# Patient Record
Sex: Male | Born: 1940 | Race: White | Hispanic: No | Marital: Married | Smoking: Former smoker
Health system: Southern US, Community
[De-identification: ages and names within clinical notes are randomized; demographics above are authoritative.]

## PROBLEM LIST (undated history)

## (undated) DIAGNOSIS — G7000 Myasthenia gravis without (acute) exacerbation: Secondary | ICD-10-CM

## (undated) DIAGNOSIS — M1990 Unspecified osteoarthritis, unspecified site: Secondary | ICD-10-CM

## (undated) DIAGNOSIS — I10 Essential (primary) hypertension: Secondary | ICD-10-CM

## (undated) DIAGNOSIS — K5090 Crohn's disease, unspecified, without complications: Secondary | ICD-10-CM

## (undated) DIAGNOSIS — J439 Emphysema, unspecified: Secondary | ICD-10-CM

## (undated) DIAGNOSIS — J449 Chronic obstructive pulmonary disease, unspecified: Secondary | ICD-10-CM

## (undated) HISTORY — PX: JOINT REPLACEMENT: SHX530

## (undated) HISTORY — PX: WISDOM TOOTH EXTRACTION: SHX21

## (undated) HISTORY — PX: TONSILLECTOMY: SUR1361

## (undated) SURGERY — ULTRASOUND, UPPER GI TRACT, ENDOSCOPIC
Anesthesia: Monitor Anesthesia Care

---

## 2001-07-28 ENCOUNTER — Ambulatory Visit (HOSPITAL_COMMUNITY): Admission: RE | Admit: 2001-07-28 | Discharge: 2001-07-28 | Payer: Self-pay | Admitting: Internal Medicine

## 2004-09-15 ENCOUNTER — Ambulatory Visit: Payer: Self-pay | Admitting: Internal Medicine

## 2005-10-24 ENCOUNTER — Ambulatory Visit: Payer: Self-pay | Admitting: Internal Medicine

## 2007-03-25 ENCOUNTER — Ambulatory Visit: Payer: Self-pay | Admitting: Internal Medicine

## 2007-04-21 ENCOUNTER — Encounter: Payer: Self-pay | Admitting: Internal Medicine

## 2007-04-21 ENCOUNTER — Ambulatory Visit: Payer: Self-pay | Admitting: Internal Medicine

## 2007-04-21 ENCOUNTER — Ambulatory Visit (HOSPITAL_COMMUNITY): Admission: RE | Admit: 2007-04-21 | Discharge: 2007-04-21 | Payer: Self-pay | Admitting: Internal Medicine

## 2007-05-07 ENCOUNTER — Ambulatory Visit: Payer: Self-pay | Admitting: Internal Medicine

## 2007-06-11 ENCOUNTER — Other Ambulatory Visit: Payer: Self-pay

## 2007-06-11 ENCOUNTER — Ambulatory Visit: Payer: Self-pay | Admitting: Surgery

## 2007-06-16 ENCOUNTER — Ambulatory Visit: Payer: Self-pay | Admitting: Surgery

## 2007-10-20 DIAGNOSIS — K5730 Diverticulosis of large intestine without perforation or abscess without bleeding: Secondary | ICD-10-CM | POA: Insufficient documentation

## 2007-10-20 DIAGNOSIS — K222 Esophageal obstruction: Secondary | ICD-10-CM | POA: Insufficient documentation

## 2007-10-20 DIAGNOSIS — L219 Seborrheic dermatitis, unspecified: Secondary | ICD-10-CM | POA: Insufficient documentation

## 2007-10-20 DIAGNOSIS — K5090 Crohn's disease, unspecified, without complications: Secondary | ICD-10-CM | POA: Insufficient documentation

## 2007-10-20 DIAGNOSIS — K449 Diaphragmatic hernia without obstruction or gangrene: Secondary | ICD-10-CM | POA: Insufficient documentation

## 2007-10-20 DIAGNOSIS — K219 Gastro-esophageal reflux disease without esophagitis: Secondary | ICD-10-CM | POA: Insufficient documentation

## 2009-05-23 ENCOUNTER — Ambulatory Visit: Payer: Self-pay | Admitting: General Practice

## 2009-06-08 ENCOUNTER — Inpatient Hospital Stay: Payer: Self-pay | Admitting: General Practice

## 2010-01-12 ENCOUNTER — Other Ambulatory Visit: Payer: Self-pay | Admitting: Ophthalmology

## 2010-02-03 ENCOUNTER — Ambulatory Visit: Payer: Self-pay | Admitting: Neurology

## 2010-06-20 NOTE — Assessment & Plan Note (Signed)
NAMEMarland Kitchen  CASIN, FEDERICI               CHART#:  [id number]   DATE:  05/07/2007                       DOB:  May 18, 1940   CHIEF COMPLAINT:  Bulge in the left lower abdomen.   SUBJECTIVE:  Mr. Sperl is here for a followup visit.  He recently  underwent a colonoscopy with Dr. Gala Romney on 04/21/2007.  This was done  primarily given the fact that it had been a while since his last  colonoscopy.  He does have a history of Crohn's colitis.  He had patchy  inflammatory changes of the rectum and entire colon.  He underwent  segmental biopsy.  This all revealed active colitis consistent with IBD.  His Asacol was increased to 400 mg t.i.d.  He presents today primarily  because he has noticed, since his colonoscopy, a swelling in his left  lower abdomen.  It is somewhat tender at times but really is not  painful.  At times, it seems to bulge out more than others.  He notes it  especially if he coughs or strains.  It seems to be worse in the  evenings.  Denies any blood in the stool or melena.  He generally has 1-  2 formed bowel movements a day.  No abdominal pain, nausea or vomiting.   CURRENT MEDICATIONS:  See updated list.   ALLERGIES:  No known drug allergies.   PHYSICAL EXAMINATION:  VITAL SIGNS:  Weight 222.  Temperature 97.7.  Blood pressure 120/80.  Pulse 72.  GENERAL:  Pleasant, well-nourished, well-developed Caucasian male in no  acute distress.  SKIN:  Warm and dry.  No jaundice.  HEENT:  Sclerae nonicteric.  ABDOMEN:  Positive bowel sounds.  Abdomen soft, nondistended.  Down in  the left suprapubic region, there is a discrete bulging consistent with  a herniation, which is easily reducible and nontender.  LOWER EXTREMITIES:  No edema.   IMPRESSION:  Mr. Bolotin is a 70 year old gentleman with Crohn's colitis  with active mucosal disease on recent colonoscopy.  Clinically, he is  doing well.  He is interested in switching from Asacol to a once daily  dosing Apriso.  He presents today  with complaints of lower abdominal  bulging, which is consistent with a hernia.  I have offered him a  referral to a surgeon but he plans to discuss this with his sister who  is a Marine scientist and may have a Nurse, children's that he is interested in.  If he needs a referral, he will let me know.  Otherwise, he will make  the appointment and be seen for possible elective hernia repair.   PLAN:  Apriso 0.375 grams 4 tablets daily in the morning #120 with 3  refills given.  Savings card provided to as well as #12 samples.  If  cost of medication is higher than Asacol, then he will simply have his  Asacol filled.  I gave him a prescription for both.  Asacol is for 400  mg t.i.d. #30 day's supply with refills.  He is due to come back in 1  year for a followup.  If he needs a referral to a surgeon, he will let  me know.  Otherwise, he will see one electively on his own per his  request.       Neil Crouch, P.A.  Electronically Signed  Bridgette Habermann, M.D.  Electronically Signed    LL/MEDQ  D:  05/07/2007  T:  05/07/2007  Job:  [id number]

## 2010-06-20 NOTE — Op Note (Signed)
Kyle Holloway, Kyle Holloway              ACCOUNT NO.:  [id number]   MEDICAL RECORD NO.:  [id number]          PATIENT TYPE:  AMB   LOCATION:  DAY                           FACILITY:  [facility]   PHYSICIAN:  R. Garfield Cornea, M.D. DATE OF BIRTH:  [birth year]   DATE OF PROCEDURE:  04/21/2007  DATE OF DISCHARGE:                               OPERATIVE REPORT   PROCEDURE:  Colonoscopy with segmental biopsy.   INDICATIONS FOR PROCEDURE:  A 70 year old gentleman with at least a 75-  year history of Crohn's colitis diagnosed elsewhere.  He had been on  Colazal and Asacol since diagnosed in 1996.  A small-bowel follow-  through was normal previously in [place], [place].  He has one  to two formed bowel movements daily, has not had any tenesmus, rectal  bleeding, abdominal pain, nausea or vomiting.  His sense of well-being  is good.  Since it has been some time since his last colonoscopy, a  colonoscopy is now being done.  This approach has been discussed with  the patient at length.  Potential risks, benefits and alternatives have  been reviewed, questions answered.  Please see documentation on the  medical record.   PROCEDURE NOTE:  O2 saturation, blood pressure, pulse and respirations  were monitored throughout the entire procedure.   CONSCIOUS SEDATION:  Versed 5 mg IV, Demerol 100 mg IV in divided doses.   INSTRUMENT:  Pentax video chip system.   FINDINGS:  Digital rectal exam revealed no abnormalities.   ENDOSCOPIC FINDINGS:  Prep was adequate.   Colon:  Colonic mucosa was surveyed from the rectosigmoid junction to  the left, transverse, right colon to the area of the appendiceal  orifice, ileocecal valve and cecum.  These structures were well seen and  photographed for the record.  From this level the scope was slowly  withdrawn and all previously mentioned mucosal surfaces were again seen.  The patient had extensive left-sided diverticula.  The patient had  patchy inflammatory  changes from the rectosigmoid junction all the way  to the cecum.  He had erosions, ulcerations, friability and granularity  in a patchy distribution from the rectosigmoid junction all the way to  the cecum.  Please see multiple photographs taken.  I attempted to  intubate the terminal ileum but could not get a good intubation.  The  opening to the ileocecal valve, however, appeared normal.  Segmental  biopsies of the ascending, transverse, descending segments were taken  for histologic study.  There was no evidence of neoplasia or polyp.  The  scope was pulled down in the rectum.  The rectal vault was quite small.  I attempted to retroflex, but was unable to do so, but for the same  reason, I was able see the rectal mucosa en face very well and the  patient's similar inflammatory changes seen in the colon extending all  the way down to the anal verge in a patchy distribution.  Biopsies of  the abnormal-appearing rectal mucosa were taken as well.  The patient  tolerated the procedure well as reactive to endoscopy.   IMPRESSION:  Patchy inflammatory changes of the rectum and entire colon,  status post segmental biopsy.   This gentleman has active mucosal disease.  His rectum is involved as  well.  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  He really has no  gastrointestinal symptoms.   RECOMMENDATIONS:  We will suggested he bump his Asacol up to two tablets  three times a day and take that as full-dose therapy.  Would also give  him latitude to consider switching to Lialda 4.8 grams once daily to  enhance compliance.  Further recommendations once the path comes back.      Kyle Holloway, M.D.  Electronically Signed     RMR/MEDQ  D:  04/21/2007  T:  04/21/2007  Job:  [id number]

## 2010-06-20 NOTE — H&P (Signed)
NAMECOLON, RUETH              ACCOUNT NO.:  [id number]   MEDICAL RECORD NO.:  [id number]         PATIENT TYPE:  AMB   LOCATION:  DAY                           FACILITY:  [facility]   PHYSICIAN:  R. Garfield Cornea, M.D. DATE OF BIRTH:  12-31-1940   DATE OF ADMISSION:  DATE OF DISCHARGE:  LH                              HISTORY & PHYSICAL   CHIEF COMPLAINT:  History of Crohn's colitis.   HISTORY OF PRESENT ILLNESS:  Kyle Holloway is a pleasant 70-year-  old gentleman who now lives in [place] who came to see me in follow-  up regarding Crohn's colitis.  He has previously seen Dr. Laural Golden and in  the distant past Dr. Rhona Leavens down at [facility] in [place]  back in 1996.  He was having abdominal cramps, diarrhea, underwent a  colonoscopy, and ultimately was diagnosed with Crohn's colitis.  At  colonoscopy in 1996 small-bowel follow-through at that time was  negative.  He was on Colazal and subsequently Asacol ever since that  time.  His symptoms rapidly resolved with mesalamine therapy.  He has 1-  2 formed bowel movements daily.  He has not had any tenesmus.  No  abdominal pain, nausea, vomiting.  He has not had any eye, skin or joint  problems.   He has history of gastroesophageal reflux disease as well as Schatzki's  ring which Dr. Sonny Masters dilated back in the 90's.  He has not had any  recurrent symptoms.   We do not have any path reports regarding diagnosis of Crohn's colitis  made previously, but Kyle Holloway has done well over the years.  He takes  2-4 Asacol tablets daily.  For a good year he did not take any, but Dr.  Laural Golden asked him to resume previously.  He could not tell any difference  not taking any Asacol.  There is no family history of inflammatory bowel  disease or colorectal neoplasia.   Dr. Laural Golden last saw him in September 2007 and the plan was to offer him  a surveillance colonoscopy in 2008, but he now returns in 2009.   PAST MEDICAL HISTORY:   Noted for gastroesophageal reflux disease,  Crohn's colitis. A 2003 colonoscopy by Dr. Laural Golden demonstrated a  pancolonic diverticula but no evidence of any other mucosal  abnormalities that go with inflammatory bowel disease.   PAST SURGERIES:  None.   PRIMARY CARE PHYSICIAN:  Dr. Jerene Bears in [place], [place].   He is retired.  He recently relocated to [place] where he lived  previously.  He just moved from [place], [place].   CURRENT MEDICATIONS:  1. Asacol two tablets twice daily.  2. Omeprazole 20 mg daily.  3. ASA 325 mg daily.   ALLERGIES:  No known drug allergies.   FAMILY HISTORY:  Mother died at age 14 of old age.  Father died at age  31 with a malignant brain tumor.  He has two sisters with some joint  problems.  One brother in good health.   SOCIAL HISTORY:  The patient is retired from [employer] of  city of  [place].  He is married.  He just sold his house in [place],  [place], and relocated to [place] where they bought a town home so  they could be closer to their children.  No tobacco, no alcohol.   REVIEW OF SYSTEMS:  No odynophagia, dysphagia, early satiety or reflux  symptoms.  No abdominal pain, no change in weight (although we do have  him up 8 pounds since his October 24, 2005 office visit).   PHYSICAL EXAMINATION:  GENERAL:  A pleasant 70 year old gentleman  resting comfortably.  VITAL SIGNS:  Weight 222, height 5 feet 10 inches, temp 97.8, BP 138/80,  pulse 80.  SKIN:  Warm and dry.  There is no jaundice or any stigmata of chronic  liver disease age.  HEENT:  No scleral icterus.  Conjunctivae pink.  CHEST:  Lungs clear to auscultation.  HEART:  Regular rate and rhythm without murmur, gallop or rub.  ABDOMEN:  Nondistended.  Positive bowel sounds.  Soft, entirely  nontender without appreciable masses or organomegaly.  EXTREMITIES:  No edema.   IMPRESSION:  Kyle Holloway is a pleasant 70 year old gentleman  who has  been given a diagnosis of Crohn's colitis, and he had pancolonic  diverticula in 2003.  Colonoscopy by Dr. Laural Golden.  The mucosa, otherwise,  appeared normal.  Dr. Laural Golden has previously raised the questions of whether or not he  actually has Crohn's colitis.  I too would also wonder about that  diagnosis on face value of how he has done and find [place] a prior  colonoscopy.   At this point we will continue on a working diagnosis of Crohn's colitis  even if that diagnosis was given to him some 13 years ago.  At this  point I would offer Kyle Holloway a surveillance colonoscopy at this time.  I have talked about this approach with Kyle Holloway.  Risks, benefits, and  alternatives have been reviewed.  He is willing to have one, but he has  questions about insurance coverage and will try get those questions  offered, but again this approach has been recommended regardless of  insurance benefits.  His gastroesophageal reflux disease symptoms have  been well-controlled on omeprazole, and I would recommend he continue on  that regimen.  Hopefully Kyle Holloway will schedule a colonoscopy in the  very near future, and will make further recommendations at that time.      Bridgette Habermann, M.D.  Electronically Signed     RMR/MEDQ  D:  03/25/2007  T:  03/26/2007  Job:  [id number]

## 2010-06-23 NOTE — Op Note (Signed)
[facility]  Patient:    Kyle Holloway, Kyle Holloway Visit Number: [id number] MRN: [id number]          Service Type: END Location: DAY Attending Physician:  Rogene Houston Dictated by:   Hildred Laser, M.D. Proc. Date: 07/28/01 Admit Date:  07/28/2001   CC:         Dr. Woody Seller   Operative Report  PROCEDURE:  Esophagogastroduodenoscopy with esophageal dilatation followed by total colonoscopy.  GASTROENTEROLOGIST:  Hildred Laser, M.D.  INDICATION:  Kyle Holloway is a 70 year old Caucasian male with chronic GERD who is maintained on antireflux measures and PPI.  He was having recurrent solid food dysphagia and is, therefore, undergoing therapeutic EGD.  He also has Crohns colitis.  His last colonoscopy was more than five years ago.  He is undergoing colonoscopy primarily for screening purposes.  He is on Asacol, and his symptoms are well controlled.  The procedure was reviewed with the patient, and informed consent was obtained.  PREOPERATIVE MEDICATIONS:  Cetacaine spray for pharyngeal topical anesthesia, Demerol 50 mg IV, Versed 5 mg IV.  INSTRUMENT:  Olympus video system.  FINDINGS:  Procedure performed in endoscopy suite.  The patients vital signs and O2 saturations were monitored during the procedure and remained stable.  PROCEDURE #1:  Esophagogastroduodenoscopy.  The patient was placed in the left lateral position and endoscope was passed into the oropharynx without difficulty into cervical esophagus where there was slight resistance.  I found that he had a web in this area.  The scope was advanced into the body of the esophagus.  Mucosa was normal.  He had Schatzkis ring at the GE junction and a moderate size sliding hiatal hernia.  Stomach:  It was empty and distended very well with insufflation.  Folds in the proximal stomach were normal.  Examination of mucosa at gastric body, antrum, and pyloric channel as well as angularis and fundus were  normal.  Duodenum:  Duodenum as well as bulb and second part of duodenum were also normal.  The endoscope was withdrawn.  Esophageal dilatation was performed by passing 56-French Maloney dilator through the esophagus completely which resulted in a small bore chipped here in the cervical esophagus, and ring was also effectively disrupted.  The endoscope was withdrawn, and the patient was prepared for procedure #2.  PROCEDURE #2 - Total Colonoscopy:  Rectal examination performed.  No abnormality noted on external or digital exam.  The scope was placed in the rectum and advanced under vision to sigmoid colon and beyond.   Preparation was satisfactory.  The scope was passed to the cecum which was identified by appendiceal orifice/stump and ileocecal valve. Pictures were taken for the record.  As the scope was withdrawn, the mucosa was carefully examined.  There were multiple diverticulum throughout the colon, but they were preponderant in the sigmoid colon.  Some of these were quite large.  The mucosa, however, was normal.  The rectal mucosa was also normal.  The scope was retroflexed in the rectum to examine the anorectal junction.  Small hemorrhoids were noted below the dentate line.  The endoscope was straightened and withdrawn.  The patient tolerated the procedure well.  FINAL DIAGNOSES: 1. Esophageal web and Schatzkis ring. 2. Moderate size sliding hiatal hernia. 3. Normal examination of stomach, first and second part of duodenum. 4. Esophagus dilated by passing 56-French Venia Minks dilatory disrupting both    the web and the ring. 5. Pancolonic diverticulosis.  No evidence of active Crohns colitis.    I  did not take biopsies this examination. 6. Small external hemorrhoids.  RECOMMENDATIONS:  Continue with antireflux measures, Prilosec and Asacol as before.  I would also like for him to stay on high-fiber diet and Citrucel 1 tablespoonful daily.  Will plan to see him in the  office in one year from now. Dictated by:   Hildred Laser, M.D. Attending Physician:  Rogene Houston DD:  07/28/01 TD:  07/29/01 Job: [id number]

## 2012-07-08 ENCOUNTER — Other Ambulatory Visit: Payer: Self-pay | Admitting: Family

## 2012-07-08 LAB — APTT: Activated PTT: 34.4 secs (ref 23.6–35.9)

## 2012-07-10 ENCOUNTER — Ambulatory Visit: Payer: Self-pay | Admitting: Gastroenterology

## 2013-05-01 ENCOUNTER — Ambulatory Visit: Payer: Self-pay | Admitting: Gastroenterology

## 2013-06-16 ENCOUNTER — Inpatient Hospital Stay: Payer: Self-pay | Admitting: Internal Medicine

## 2013-06-16 LAB — COMPREHENSIVE METABOLIC PANEL
ALT: 22 U/L (ref 12–78)
ANION GAP: 9 (ref 7–16)
AST: 14 U/L — AB (ref 15–37)
Albumin: 3.7 g/dL (ref 3.4–5.0)
Alkaline Phosphatase: 62 U/L
BUN: 14 mg/dL (ref 7–18)
Bilirubin,Total: 0.6 mg/dL (ref 0.2–1.0)
CALCIUM: 8.9 mg/dL (ref 8.5–10.1)
CHLORIDE: 106 mmol/L (ref 98–107)
CREATININE: 1.12 mg/dL (ref 0.60–1.30)
Co2: 25 mmol/L (ref 21–32)
Glucose: 119 mg/dL — ABNORMAL HIGH (ref 65–99)
OSMOLALITY: 281 (ref 275–301)
Potassium: 3.9 mmol/L (ref 3.5–5.1)
Sodium: 140 mmol/L (ref 136–145)
Total Protein: 8.1 g/dL (ref 6.4–8.2)

## 2013-06-16 LAB — CBC
HCT: 43 % (ref 40.0–52.0)
HGB: 13.9 g/dL (ref 13.0–18.0)
MCH: 32.2 pg (ref 26.0–34.0)
MCHC: 32.4 g/dL (ref 32.0–36.0)
MCV: 99 fL (ref 80–100)
Platelet: 309 10*3/uL (ref 150–440)
RBC: 4.32 10*6/uL — AB (ref 4.40–5.90)
RDW: 15.1 % — ABNORMAL HIGH (ref 11.5–14.5)
WBC: 11 10*3/uL — AB (ref 3.8–10.6)

## 2013-06-16 LAB — PROTIME-INR
INR: 1.1
PROTHROMBIN TIME: 13.8 s (ref 11.5–14.7)

## 2013-06-17 LAB — CBC WITH DIFFERENTIAL/PLATELET
BASOS ABS: 0 10*3/uL (ref 0.0–0.1)
BASOS PCT: 0.1 %
EOS ABS: 0 10*3/uL (ref 0.0–0.7)
Eosinophil %: 0 %
HCT: 37.5 % — AB (ref 40.0–52.0)
HGB: 12.7 g/dL — ABNORMAL LOW (ref 13.0–18.0)
Lymphocyte #: 0.7 10*3/uL — ABNORMAL LOW (ref 1.0–3.6)
Lymphocyte %: 6.6 %
MCH: 33.2 pg (ref 26.0–34.0)
MCHC: 33.9 g/dL (ref 32.0–36.0)
MCV: 98 fL (ref 80–100)
MONO ABS: 0.2 x10 3/mm (ref 0.2–1.0)
MONOS PCT: 1.5 %
NEUTROS ABS: 9.7 10*3/uL — AB (ref 1.4–6.5)
NEUTROS PCT: 91.8 %
PLATELETS: 272 10*3/uL (ref 150–440)
RBC: 3.83 10*6/uL — ABNORMAL LOW (ref 4.40–5.90)
RDW: 14.8 % — AB (ref 11.5–14.5)
WBC: 10.6 10*3/uL (ref 3.8–10.6)

## 2013-06-17 LAB — BASIC METABOLIC PANEL
ANION GAP: 6 — AB (ref 7–16)
BUN: 17 mg/dL (ref 7–18)
CALCIUM: 8.4 mg/dL — AB (ref 8.5–10.1)
CO2: 21 mmol/L (ref 21–32)
Chloride: 110 mmol/L — ABNORMAL HIGH (ref 98–107)
Creatinine: 0.94 mg/dL (ref 0.60–1.30)
EGFR (African American): >60
EGFR (Non-African Amer.): >60
GLUCOSE: 147 mg/dL — AB (ref 65–99)
OSMOLALITY: 278 (ref 275–301)
Potassium: 4.3 mmol/L (ref 3.5–5.1)
Sodium: 137 mmol/L (ref 136–145)

## 2013-06-17 LAB — HEMATOCRIT: HCT: 37.5 % — ABNORMAL LOW (ref 40.0–52.0)

## 2013-06-18 LAB — HEMOGLOBIN: HGB: 12.4 g/dL — ABNORMAL LOW (ref 13.0–18.0)

## 2013-12-28 DIAGNOSIS — N401 Enlarged prostate with lower urinary tract symptoms: Secondary | ICD-10-CM | POA: Insufficient documentation

## 2013-12-28 DIAGNOSIS — N400 Enlarged prostate without lower urinary tract symptoms: Secondary | ICD-10-CM | POA: Insufficient documentation

## 2014-04-08 ENCOUNTER — Ambulatory Visit: Payer: Self-pay | Admitting: Ophthalmology

## 2014-04-20 ENCOUNTER — Ambulatory Visit: Payer: Self-pay | Admitting: Ophthalmology

## 2014-05-11 ENCOUNTER — Ambulatory Visit
Admit: 2014-05-11 | Disposition: A | Discharge status: Home / Self Care | Payer: Self-pay | Attending: Ophthalmology | Admitting: Ophthalmology

## 2014-05-29 NOTE — H&P (Signed)
PATIENT NAME:  Kyle Holloway, Kyle Holloway MR#:  [id number] DATE OF BIRTH:  26-Mar-1940  DATE OF ADMISSION:  06/16/2013  PRIMARY PHYSICIAN: Dr. Baldemar Lenis.   EMERGENCY DEPARTMENT REFERRING PHYSICIAN: Dr. Jimmye Norman.   CHIEF COMPLAINT: Episode of bright red blood mixed in with maroon-colored stools x 2.   HISTORY OF PRESENT ILLNESS: The patient is a 74 year old white male who reports that he had a similar episode of bright red blood mixed with maroon-colored stools about a year ago at the same time. At that time, he was in the hospital in [place]. He had to be transfused, and the bleeding stopped spontaneously. Subsequently, he underwent a colonoscopy and EGD with Dr. Candace Cruise. He was noted to have a polyp but along with that he had multiple diverticula that were noted. The patient reports that he was doing fine up until 3:40 this afternoon when he started having bright red blood mixed with some maroon-colored blood. He has had 2 large episodes of this. Due to these, he came to the ED. His hemoglobin is currently stable at 13.9, but there is a concern that he continue to have recurrence of the bleeding. Therefore, he is being admitted. He otherwise denies any chest pain, shortness of breath. No palpitations. No nausea, vomiting, diarrhea. Denies any abdominal pain.   PAST MEDICAL HISTORY:  1. History of diverticulosis.  2. History of esophageal stricture.  3. History of Crohn's disease.  4. History of optical multiple sclerosis.   PAST SURGICAL HISTORY: Status post left knee surgery.   ALLERGIES: None.   MEDICATIONS: He is on methotrexate q. weekly. He is not sure of the dose. He is on Apriso 4 tabs daily, omeprazole daily, Flomax daily, folic acid daily.   SOCIAL HISTORY: Does not smoke. Does not drink. No drugs.   FAMILY HISTORY: Positive for hypertension.   REVIEW OF SYSTEMS:  CONSTITUTIONAL: Denies any fevers, fatigue, weakness. No pain. No weight loss. No weight gain.  EYES: No blurred or double  vision. No pain. No redness. No inflammation. No glaucoma. No cataracts.  ENT: No tinnitus. No ear pain. No hearing loss. No seasonal or year-round allergies. No difficulty swallowing.  RESPIRATORY: Denies any cough, wheezing, hemoptysis.  CARDIOVASCULAR: Denies any chest pain, orthopnea, edema or arrhythmia.  GASTROINTESTINAL: No nausea, vomiting, diarrhea. No abdominal pain. Complains of blood in the stool.  GENITOURINARY: Denies any dysuria, hematuria, renal calculus or frequency.  ENDOCRINE: Denies any polyuria, nocturia.  HEMATOLOGIC AND LYMPHATIC: Denies anemia, easy bruisability or bleeding.  SKIN: No acne. No rash.  MUSCULOSKELETAL: Denies any pain in the neck, back or shoulder.  NEUROLOGIC: No CVA, TIA. Has a history of optical multiple sclerosis.   PSYCHIATRIC: No anxiety, insomnia,   PHYSICAL EXAMINATION:  VITAL SIGNS: Temperature 97.9, pulse 94, respirations 18, blood pressure 118/71, O2 is 95%.  GENERAL: The patient is an obese male in no acute distress.  HEENT: Head atraumatic, normocephalic. Pupils equally round, reactive to light and accommodation. There is no conjunctival pallor. No scleral icterus. Nasal exam shows no drainage or ulceration. Oropharynx is clear without any exudate.  NECK: Supple without any JVD.  CARDIOVASCULAR: Regular rate and rhythm. No murmurs, rubs, clicks or gallops.  LUNGS: Clear to auscultation bilaterally without any rales, rhonchi, wheezing.  ABDOMEN: Soft, nontender, nondistended. Positive bowel sounds x 4.  EXTREMITIES: No clubbing, cyanosis or edema.  SKIN: No rash.  LYMPHATICS: No lymph nodes palpable.  VASCULAR: Good DP, PT pulses.  PSYCHIATRIC: Not anxious or depressed.  NEUROLOGIC: Awake, alert, oriented x  3. No focal deficits.   LABORATORIES: Glucose 119, BUN , creatinine 1.12, sodium 140, potassium 3.9, chloride 106, CO2 is 25, calcium 8.9. LFTs are normal except AST of 14. WBC 11.0, hemoglobin 13.9 and platelet count 309. INR 1.1.    ASSESSMENT AND PLAN: The patient is a 74 year old white male with history of diverticula, presents with maroon/bright red blood-colored stools since today.  1. Gastrointestinal bleed, likely lower, cause diverticular. At this time, we will get a gastroenterology consult. Follow hemoglobin and hematocrit and transfuse p.r.n.  2. History of Crohn's disease. Will continue mesalamine as taking at home.  3. Gastroesophageal reflux disease. Will continue proton pump inhibitors b.i.d.  4. Optical multiple sclerosis. The patient will resume methotrexate on discharge.  5. Will do thromboembolic deterrent hose for deep vein thrombosis prophylaxis.   TIME SPENT ON THIS PATIENT: 50 minutes.   ____________________________ Lafonda Mosses Posey Pronto, MD shp:gb D: 06/16/2013 21:32:06 ET T: 06/16/2013 22:11:17 ET JOB#: [id number]  cc: Mikell Camp H. Posey Pronto, MD, <Dictator> Alric Seton MD ELECTRONICALLY SIGNED 06/19/2013 8:32

## 2014-05-29 NOTE — Consult Note (Signed)
Brief Consult Note: Diagnosis: Lower GI bleed probable diverticular in nature.  Known history of Crohn's colitis.  Mild anemia probable in correlation with acute lower GI bleeding.   Consult note dictated.   Discussed with Attending MD.   Comments: Patient's presentation discussed with Dr. Verdie Shire.  Recommedation is for patient to remain hospitalized over night as he was just admitted at 10 pm last night.  Allow at least 24 hours of close monitoring.  Will continue to monitor hemoglobin and hematocrit.  Transfuse if necessary.  Will advance diet to low residue diet.  If his condition remains stable recommend discharge in am.  Electronic Signatures: Payton Emerald (NP)  (Signed 13-May-15 15:28)  Authored: Brief Consult Note   Last Updated: 13-May-15 15:28 by Payton Emerald (NP)

## 2014-05-29 NOTE — Consult Note (Signed)
Pt seen and examined. Please see Kyle Holloway's notes. No rectal bleeding since last night. Stable Crohn's. Hx of diverticulosis. Likely had diverticular bleed. Pt wants to go  home but only on clears so far. Try low residue diet tonight. If no further bleeding overnight, then ok for discharge tomorrow AM. thanks.  Electronic Signatures: Verdie Shire (MD)  (Signed on 13-May-15 15:49)  Authored  Last Updated: 13-May-15 15:49 by Verdie Shire (MD)

## 2014-05-29 NOTE — Consult Note (Signed)
PATIENT NAME:  Kyle Holloway, Kyle Holloway MR#:  [id number] DATE OF BIRTH:  1940/02/19  DATE OF CONSULTATION:  06/17/2013  REFERRING PHYSICIAN:   CONSULTING PHYSICIAN:  Payton Emerald, NP  PRIMARY CARE PHYSICIAN:  Derinda Late, MD  ATTENDING PHYSICIAN:  Shreyang H. Posey Pronto, MD  REASON FOR CONSULTATION: GI bleed.   HISTORY OF PRESENT ILLNESS: Mr. Gervase is a very pleasant 74 year old Caucasian gentleman who is well known to myself as well as Dr. Verdie Shire. He has a history of Crohn's colitis as well as diverticulosis and esophageal stricture. The patient had been traveling home from [facility] yesterday evening, states that he got home, went out shopping and then around "suppertime" states felt the need to defecate. Passage of feces as well as bright red blood which eventually was more darker color blood. A substantial amount of blood noted the first time. Then he had a second occurrence later that evening which led to him presenting to the Emergency Room. The second time blood still a moderate a large amount but stool was darker still in color. No bright red blood. He states he started to feel weak. Yesterday evening, earlier part of the evening, he also had a bowel movement with evidence blood, again dark in nature, and this morning a bowel movement only passage of a couple "drops of blood." He has had no abdominal pain during this time, no nausea, or vomiting. He was in his normal activities of daily living prior to the onset of bleeding. The patient actually states that he feels well enough to go home. Hemoglobin was 13.9 on admission with hematocrit of 43.0 and trending it hemoglobin has dropped to 12.7, hematocrit 37.5. White count was elevated at 11.0 and currently 10.6. As otherwise stated, GI review of systems is unremarkable.   PAST MEDICAL HISTORY: Diverticulosis, esophageal stricture, Crohn's colitis, multiple sclerosis.   PAST SURGICAL HISTORY: Left knee surgery. Most recent colonoscopy was done  July 10, 2012, with findings of diverticulosis involving entire colon, noted to be unable to intubate the terminal ileum. EGD was done as well on this date for the indication heme-positive stool with a benign-appearing intrinsic moderate pseudocyst which was successfully dilated, a medium-size hiatal hernia.   ALLERGIES: None.   HOME MEDICATIONS: Methotrexate weekly, unsure of dose; Apriso 0.375 grams capsule 4 capsules daily, omeprazole 20 mg daily, Flomax daily and folic acid daily.   SOCIAL HISTORY: No tobacco. No alcohol. No recreational drug use.   FAMILY HISTORY: Significant for hypertension.   REVIEW OF SYSTEMS: All 10 systems reviewed and checked, otherwise unremarkable other than what is stated above.   PHYSICAL EXAMINATION: VITAL SIGNS: Temperature is 97.4, pulse is 92, respirations 18, blood pressure is 117/82 with a pulse oximetry of 96% on room air.  GENERAL: Well-developed, well-nourished, 74 year old Caucasian gentleman, no acute distress noted. Pleasant.  HEENT: Normocephalic, atraumatic. Pupils equal, reactive to light. Conjunctivae clear. Sclerae anicteric.  NECK: Supple. Trachea midline. No lymphadenopathy or thyromegaly.  PULMONARY: Symmetric rise and fall of chest. Clear to auscultation throughout.  CARDIOVASCULAR: Regular rhythm, S1, S2. No murmurs, no gallops.  ABDOMEN: Soft, nondistended. Bowel sounds in all 4 quadrants. No bruits. No masses.  RECTAL: Deferred.  MUSCULOSKELETAL: Moving all 4 extremities. No contractures. No clubbing.  EXTREMITIES: No edema.  PSYCHIATRIC: Alert and oriented x 4. Memory grossly intact. Appropriate affect and mood.  NEUROLOGICAL: No gross neurological deficits.   LABORATORY, DIAGNOSTIC AND RADIOLOGICAL DATA: Chemistry panel on admission essentially within normal limits except glucose was elevated at  119, this morning glucose is 147, calcium has dropped slightly from 8.9 to 8.4. Hepatic panel within normal limits except AST is low at 14.  Antibody screen is negative with an ABO group plus Rh type of A+. PT is 13.8 with an INR of 1.1.   IMPRESSION:  Lower gastrointestinal bleed, probable diverticular in nature. Known history of diverticulosis, history of Crohn's colitis. Pattern does not seem though in clear presentation as an exacerbation of inflammatory bowel disease. Mild anemia.   PLAN: The patient's presentation will be discussed with Dr. Verdie Shire. At this time, though I do feel the patient is able to be discharged. He has had no further episodes of bleeding since this morning. The patient is, in fact, requesting to go home if possible. Would recommend that he follow up with our office in 1 to 2 weeks. If the patient is to remain hospitalized overnight, recommend continued trending of hemoglobin and hematocrit and to transfuse as necessary.   ____________________________ Payton Emerald, NP dsh:cs D: 06/17/2013 14:40:00 ET T: 06/17/2013 18:02:02 ET JOB#: [id number]  cc: Payton Emerald, NP, <Dictator> Payton Emerald MD ELECTRONICALLY SIGNED 06/22/2013 18:21

## 2014-05-29 NOTE — Discharge Summary (Signed)
Dates of Admission and Diagnosis:  Date of Admission 16-Jun-2013   Date of Discharge 18-Jun-2013   Admitting Diagnosis [facility]   Final Diagnosis 1. Diverticular bleed    Chief Complaint/History of Present Illness CHIEF COMPLAINT: Episode of bright red blood mixed in with maroon-colored stools x 2.   HISTORY OF PRESENT ILLNESS: The patient is a 74 year old white male who reports that he had a similar episode of bright red blood mixed with maroon-colored stools about a year ago at the same time. At that time, he was in the hospital in [place]. He had to be transfused, and the bleeding stopped spontaneously. Subsequently, he underwent a colonoscopy and EGD with Dr. Candace Cruise. He was noted to have a polyp but along with that he had multiple diverticula that were noted. The patient reports that he was doing fine up until 3:40 this afternoon when he started having bright red blood mixed with some maroon-colored blood. He has had 2 large episodes of this. Due to these, he came to the ED. His hemoglobin is currently stable at 13.9, but there is a concern that he continue to have recurrence of the bleeding. Therefore, he is being admitted. He otherwise denies any chest pain, shortness of breath. No palpitations. No nausea, vomiting, diarrhea. Denies any abdominal pain.   Allergies:  No Known Allergies:   Hepatic:  12-May-15 19:23   Bilirubin, Total 0.6  Alkaline Phosphatase 62 (45-117 NOTE: New Reference Range 12/26/12)  SGPT (ALT) 22  SGOT (AST)  14  Total Protein, Serum 8.1  Albumin, Serum 3.7  Routine BB:  12-May-15 20:44   ABO Group + Rh Type A Positive  Antibody Screen NEGATIVE (Result(s) reported on 16 Jun 2013 at 09:47PM.)  Cardiology:  12-May-15 19:20   Ventricular Rate 99  Atrial Rate 99  P-R Interval 148  QRS Duration 80  QT 354  QTc 454  P Axis 41  R Axis 16  T Axis 22  ECG interpretation Normal sinus rhythm Normal ECG No previous ECGs  available ----------unconfirmed---------- Confirmed by OVERREAD, NOT ([id number]), editor PEARSON, BARBARA ([id number]) on 06/17/2013 1:00:30 PM  Routine Chem:  12-May-15 19:23   Glucose, Serum  119  BUN 14  Creatinine (comp) 1.12  Sodium, Serum 140  Potassium, Serum 3.9  Chloride, Serum 106  CO2, Serum 25  Calcium (Total), Serum 8.9  Anion Gap 9  Osmolality (calc) 281  eGFR (African American) >60  eGFR (Non-African American) >60 (eGFR values <16m/min/1.73 m2 may be an indication of chronic kidney disease (CKD). Calculated eGFR is useful in patients with stable renal function. The eGFR calculation will not be reliable in acutely ill patients when serum creatinine is changing rapidly. It is not useful in  patients on dialysis. The eGFR calculation may not be applicable to patients at the low and high extremes of body sizes, pregnant women, and vegetarians.)  13-May-15 05:48   Glucose, Serum  147  BUN 17  Creatinine (comp) 0.94  Sodium, Serum 137  Potassium, Serum 4.3  Chloride, Serum  110  CO2, Serum 21  Calcium (Total), Serum  8.4  Anion Gap  6  Osmolality (calc) 278  eGFR (African American) >60  eGFR (Non-African American) >60 (eGFR values <661mmin/1.73 m2 may be an indication of chronic kidney disease (CKD). Calculated eGFR is useful in patients with stable renal function. The eGFR calculation will not be reliable in acutely ill patients when serum creatinine is changing rapidly. It is not useful in  patients on dialysis. The eGFR calculation  may not be applicable to patients at the low and high extremes of body sizes, pregnant women, and vegetarians.)  Routine Coag:  12-May-15 19:23   Prothrombin 13.8  INR 1.1 (INR reference interval applies to patients on anticoagulant therapy. A single INR therapeutic range for coumarins is not optimal for all indications; however, the suggested range for most indications is 2.0 - 3.0. Exceptions to the INR Reference Range may include:  Prosthetic heart valves, acute myocardial infarction, prevention of myocardial infarction, and combinations of aspirin and anticoagulant. The need for a higher or lower target INR must be assessed individually. Reference: The Pharmacology and Management of the Vitamin K  antagonists: the seventh ACCP Conference on Antithrombotic and Thrombolytic Therapy. QJFHL.4562 Sept:126 (3suppl): N9146842. A HCT value >55% may artifactually increase the PT.  In one study,  the increase was an average of 25%. Reference:  "Effect on Routine and Special Coagulation Testing Values of Citrate Anticoagulant Adjustment in Patients with High HCT Values." American Journal of Clinical Pathology 2006;126:400-405.)  Routine Hem:  12-May-15 19:23   Hemoglobin (CBC) 13.9  WBC (CBC)  11.0  RBC (CBC)  4.32  Hematocrit (CBC) 43.0  Platelet Count (CBC) 309 (Result(s) reported on 16 Jun 2013 at 07:48PM.)  MCV 99  MCH 32.2  MCHC 32.4  RDW  15.1  13-May-15 01:41   Hematocrit (CBC)  37.5 (Result(s) reported on 17 Jun 2013 at 02:04AM.)    05:48   Hemoglobin (CBC)  12.7  WBC (CBC) 10.6  RBC (CBC)  3.83  Hematocrit (CBC)  37.5  Platelet Count (CBC) 272  MCV 98  MCH 33.2  MCHC 33.9  RDW  14.8  Neutrophil % 91.8  Lymphocyte % 6.6  Monocyte % 1.5  Eosinophil % 0.0  Basophil % 0.1  Neutrophil #  9.7  Lymphocyte #  0.7  Monocyte # 0.2  Eosinophil # 0.0  Basophil # 0.0 (Result(s) reported on 17 Jun 2013 at 06:16AM.)  14-May-15 04:38   Hemoglobin (CBC)  12.4 (Result(s) reported on 18 Jun 2013 at 05:54AM.)   Pertinent Past History:  Pertinent Past History PAST MEDICAL HISTORY:  1. diverticulosis.  2. esophageal stricture.  3. Crohn's disease.  4. optical multiple sclerosis.   Hospital Course:  Hospital Course 74 y.o with h/o diverticulosis presents with maroon color stools since 06/16/2013  * Lower GI bleed- Likely diverticular. Hb has no significant drop. ASA on hold. On day of discharge pt had 1-2  drops of blood. Vital and Hb stable. No procedures planned and was discharged home. ASA on hold till he sees Dr. Candace Cruise. Started on FeSO4. Prior to d/c Abdomen - Soft, NT, BS+ S1, S2 Lungs CTA  * h/o crohns ds continue meslamine  * gerd- ppi bid  * optial multiple sclerosis- mtx  Time spent on d/c 40 min   Condition on Discharge Fair   Code Status:  Code Status Full Code   DISCHARGE INSTRUCTIONS HOME MEDS:  Medication Reconciliation: Patient's Home Medications at Discharge:     Medication Instructions  apriso 0.375 g oral capsule, extended release  4 cap(s) orally once a day (in the morning)   omeprazole 20 mg oral delayed release capsule  1 cap(s) orally once a day   aspirin 81 mg oral tablet  1 tab(s) orally once a day   flomax 0.4 mg oral capsule  1 cap(s) orally once a day   folic acid 1 mg oral tablet  1 tab(s) orally once a day   methotrexate 2.5 mg  oral tablet  6 tab(s) orally once a week   vitamin b-12  1 tab(s) orally once a day   multivitamin  1 tab(s) orally once a day   biotin  1 tab(s) orally once a day   ferrous sulfate 324 mg (65 mg elemental iron) oral delayed release tablet  1 tab(s) orally 2 times a day (with meals)     Physician's Instructions:  Diet Regular   Activity Limitations As tolerated   Return to Work Not Applicable   Time frame for Follow Up Appointment 1-2 weeks  Dr. Candace Cruise   Other Comments Call Dr. Myrna Blazer office or return to emergency room if any worsening of bleeding   Electronic Signatures: Nesiah Jump, Lottie Dawson (MD)  (Signed 14-May-15 17:03)  Authored: ADMISSION DATE AND DIAGNOSIS, CHIEF COMPLAINT/HPI, Allergies, PERTINENT LABS, [place], [place], PATIENT INSTRUCTIONS   Last Updated: 14-May-15 17:03 by Alba Destine (MD)

## 2014-06-06 NOTE — Op Note (Signed)
PATIENT NAME:  Kyle Holloway, Kyle Holloway MR#:  [id number] DATE OF BIRTH:  11/17/1940  DATE OF PROCEDURE:  04/20/2014  PREOPERATIVE DIAGNOSIS:  Nuclear sclerotic cataract of the right eye.   POSTOPERATIVE DIAGNOSIS:  Nuclear sclerotic cataract of the right eye.   OPERATIVE PROCEDURE:  Cataract extraction by phacoemulsification with implant of intraocular lens to right eye.   SURGEON:  Livingston Diones. Sylvan Sookdeo, MD  ANESTHESIA:  1. Managed anesthesia care.  2. Topical tetracaine drops followed by 2% Xylocaine jelly applied in the preoperative holding area.   COMPLICATIONS:  None.   TECHNIQUE:  Stop and chop.  DESCRIPTION OF PROCEDURE:  The patient was examined and consented in the preoperative holding area where the aforementioned topical anesthesia was applied to the right eye and then brought back to the Operating Room where the right eye was prepped and draped in the usual sterile ophthalmic fashion and a lid speculum was placed. A paracentesis was created with the side port blade and the anterior chamber was filled with viscoelastic. A near clear corneal incision was performed with the steel keratome. A continuous curvilinear capsulorrhexis was performed with a cystotome followed by the capsulorrhexis forceps. Hydrodissection and hydrodelineation were carried out with BSS on a blunt cannula. The lens was removed in a stop and chop technique and the remaining cortical material was removed with the irrigation-aspiration handpiece. The capsular bag was inflated with viscoelastic and the Tecnis ZCB00, 19.0-diopter lens, serial number [id number], was placed in the capsular bag without complication. The remaining viscoelastic was removed from the eye with the irrigation-aspiration handpiece. The wounds were hydrated. The anterior chamber was flushed with Miostat and the eye was inflated to physiologic pressure. 0.1 mL of cefuroxime concentration 10 mg/mL was placed in the anterior chamber. The wounds were found to  be water tight. The eye was dressed with Vigamox. The patient was given protective glasses to wear throughout the day and a shield with which to sleep tonight. The patient was also given drops with which to begin a drop regimen today and will follow up with me in one day.    ____________________________ Livingston Diones. Guila Owensby, MD wlp:nb D: 04/20/2014 21:46:06 ET T: 04/21/2014 06:20:39 ET JOB#: [id number]  cc: Twyla Dais L. Joyia Riehle, MD, <Dictator> Livingston Diones Shauntel Prest MD ELECTRONICALLY SIGNED 04/21/2014 11:00

## 2014-06-06 NOTE — Op Note (Signed)
PATIENT NAME:  Kyle Holloway, Kyle Holloway MR#:  [id number] DATE OF BIRTH:  1940/07/27  DATE OF PROCEDURE:  05/11/2014  LOCATION: [facility].    PREOPERATIVE DIAGNOSIS:  Nuclear sclerotic cataract of the left eye.   POSTOPERATIVE DIAGNOSIS:  Nuclear sclerotic cataract of the left eye.   OPERATIVE PROCEDURE:  Cataract extraction by phacoemulsification with implant of intraocular lens to left eye.   SURGEON:  Birder Robson, MD.   ANESTHESIA:  1. Managed anesthesia care.  2. Topical tetracaine drops followed by 2% Xylocaine jelly applied in the preoperative holding area.   COMPLICATIONS:  None.   TECHNIQUE:   Stop and Chop.  DESCRIPTION OF PROCEDURE:  The patient was examined and consented in the preoperative holding area where the aforementioned topical anesthesia was applied to the left eye and then brought back to the Operating Room where the left eye was prepped and draped in the usual sterile ophthalmic fashion and a lid speculum was placed. A paracentesis was created with the side port blade and the anterior chamber was filled with viscoelastic. A near clear corneal incision was performed with the steel keratome. A continuous curvilinear capsulorrhexis was performed with a cystotome followed by the capsulorrhexis forceps. Hydrodissection and hydrodelineation were carried out with BSS on a blunt cannula. The lens was removed in a stop and chop technique and the remaining cortical material was removed with the irrigation-aspiration handpiece. The capsular bag was inflated with viscoelastic and the Tecnis  CCB00, 19.5-diopter lens, serial number [id number] was placed in the capsular bag without complication. The remaining viscoelastic was removed from the eye with the irrigation-aspiration handpiece. The wounds were hydrated. The anterior chamber was flushed with Miostat and the eye was inflated to physiologic pressure. 0.1 mL of cefuroxime concentration 10 mg/mL was placed in the anterior chamber. The  wounds were found to be water tight. The eye was dressed with Vigamox. The patient was given protective glasses to wear throughout the day and a shield with which to sleep tonight. The patient was also given drops with which to begin a drop regimen today and will follow-up with me in one day.      ____________________________ Livingston Diones. Nancey Kreitz, MD wlp:DT D: 05/11/2014 12:29:49 ET T: 05/11/2014 13:25:25 ET JOB#: [id number]  cc: Kwinton Maahs L. Jabes Primo, MD, <Dictator> Livingston Diones Anniece Bleiler MD ELECTRONICALLY SIGNED 05/12/2014 12:34

## 2017-09-03 ENCOUNTER — Encounter: Admission: RE | Payer: Self-pay | Source: Ambulatory Visit

## 2017-09-03 ENCOUNTER — Ambulatory Visit: Admission: RE | Admit: 2017-09-03 | Payer: Self-pay | Source: Ambulatory Visit | Admitting: Gastroenterology

## 2017-09-03 SURGERY — COLONOSCOPY WITH PROPOFOL
Anesthesia: General

## 2017-12-28 ENCOUNTER — Inpatient Hospital Stay
Admission: EM | Admit: 2017-12-28 | Discharge: 2017-12-29 | DRG: 057 | Disposition: A | Discharge status: Home / Self Care | Payer: Medicare Other | Attending: Family Medicine | Admitting: Family Medicine

## 2017-12-28 ENCOUNTER — Inpatient Hospital Stay: Payer: Medicare Other

## 2017-12-28 ENCOUNTER — Other Ambulatory Visit: Payer: Self-pay

## 2017-12-28 ENCOUNTER — Emergency Department: Payer: Medicare Other

## 2017-12-28 DIAGNOSIS — G7000 Myasthenia gravis without (acute) exacerbation: Secondary | ICD-10-CM | POA: Diagnosis present

## 2017-12-28 DIAGNOSIS — J441 Chronic obstructive pulmonary disease with (acute) exacerbation: Secondary | ICD-10-CM | POA: Diagnosis present

## 2017-12-28 DIAGNOSIS — M1710 Unilateral primary osteoarthritis, unspecified knee: Secondary | ICD-10-CM | POA: Diagnosis present

## 2017-12-28 DIAGNOSIS — G7001 Myasthenia gravis with (acute) exacerbation: Principal | ICD-10-CM | POA: Diagnosis present

## 2017-12-28 DIAGNOSIS — Z808 Family history of malignant neoplasm of other organs or systems: Secondary | ICD-10-CM | POA: Diagnosis not present

## 2017-12-28 DIAGNOSIS — R0602 Shortness of breath: Secondary | ICD-10-CM

## 2017-12-28 DIAGNOSIS — Z87891 Personal history of nicotine dependence: Secondary | ICD-10-CM

## 2017-12-28 DIAGNOSIS — Z7982 Long term (current) use of aspirin: Secondary | ICD-10-CM

## 2017-12-28 DIAGNOSIS — K219 Gastro-esophageal reflux disease without esophagitis: Secondary | ICD-10-CM | POA: Diagnosis present

## 2017-12-28 DIAGNOSIS — I10 Essential (primary) hypertension: Secondary | ICD-10-CM | POA: Diagnosis present

## 2017-12-28 DIAGNOSIS — K5090 Crohn's disease, unspecified, without complications: Secondary | ICD-10-CM | POA: Diagnosis present

## 2017-12-28 DIAGNOSIS — Z79899 Other long term (current) drug therapy: Secondary | ICD-10-CM | POA: Diagnosis not present

## 2017-12-28 HISTORY — DX: Myasthenia gravis without (acute) exacerbation: G70.00

## 2017-12-28 LAB — CBC WITH DIFFERENTIAL/PLATELET
ABS IMMATURE GRANULOCYTES: 0.06 10*3/uL (ref 0.00–0.07)
BASOS ABS: 0.1 10*3/uL (ref 0.0–0.1)
Basophils Relative: 1 %
Eosinophils Absolute: 0.3 10*3/uL (ref 0.0–0.5)
Eosinophils Relative: 3 %
HEMATOCRIT: 43.3 % (ref 39.0–52.0)
HEMOGLOBIN: 14.7 g/dL (ref 13.0–17.0)
Immature Granulocytes: 1 %
LYMPHS ABS: 2.1 10*3/uL (ref 0.7–4.0)
Lymphocytes Relative: 24 %
MCH: 31.7 pg (ref 26.0–34.0)
MCHC: 33.9 g/dL (ref 30.0–36.0)
MCV: 93.3 fL (ref 80.0–100.0)
Monocytes Absolute: 0.8 10*3/uL (ref 0.1–1.0)
Monocytes Relative: 9 %
NEUTROS ABS: 5.7 10*3/uL (ref 1.7–7.7)
NEUTROS PCT: 62 %
NRBC: 0 % (ref 0.0–0.2)
Platelets: 337 10*3/uL (ref 150–400)
RBC: 4.64 MIL/uL (ref 4.22–5.81)
RDW: 13 % (ref 11.5–15.5)
WBC: 8.9 10*3/uL (ref 4.0–10.5)

## 2017-12-28 LAB — COMPREHENSIVE METABOLIC PANEL
ALBUMIN: 4.1 g/dL (ref 3.5–5.0)
ALK PHOS: 44 U/L (ref 38–126)
ALT: 12 U/L (ref 0–44)
AST: 19 U/L (ref 15–41)
Anion gap: 8 (ref 5–15)
BILIRUBIN TOTAL: 0.7 mg/dL (ref 0.3–1.2)
BUN: 13 mg/dL (ref 8–23)
CO2: 26 mmol/L (ref 22–32)
Calcium: 8.7 mg/dL — ABNORMAL LOW (ref 8.9–10.3)
Chloride: 103 mmol/L (ref 98–111)
Creatinine, Ser: 0.94 mg/dL (ref 0.61–1.24)
GFR calc Af Amer: >60 mL/min (ref >60)
GFR calc non Af Amer: >60 mL/min (ref >60)
GLUCOSE: 97 mg/dL (ref 70–99)
Potassium: 3.6 mmol/L (ref 3.5–5.1)
Sodium: 137 mmol/L (ref 135–145)
TOTAL PROTEIN: 7.8 g/dL (ref 6.5–8.1)

## 2017-12-28 LAB — TSH: TSH: 10.215 u[IU]/mL — AB (ref 0.350–4.500)

## 2017-12-28 LAB — TROPONIN I: Troponin I: <0.03 ng/mL (ref <0.03)

## 2017-12-28 MED ORDER — BUDESONIDE 0.5 MG/2ML IN SUSP
0.5000 mg | Freq: Two times a day (BID) | RESPIRATORY_TRACT | Status: DC
  Administered 2017-12-28 – 2017-12-29 (×2): 0.5 mg via RESPIRATORY_TRACT
  Filled 2017-12-28 (×2): qty 2

## 2017-12-28 MED ORDER — ACETAMINOPHEN 325 MG PO TABS: 650.0000 mg | ORAL_TABLET | Freq: Four times a day (QID) | ORAL | Status: DC | PRN

## 2017-12-28 MED ORDER — TAMSULOSIN HCL 0.4 MG PO CAPS
0.4000 mg | ORAL_CAPSULE | Freq: Every day | ORAL | Status: DC
  Administered 2017-12-28: 0.4 mg via ORAL
  Filled 2017-12-28: qty 1

## 2017-12-28 MED ORDER — PYRIDOSTIGMINE BROMIDE 60 MG PO TABS
30.0000 mg | ORAL_TABLET | Freq: Three times a day (TID) | ORAL | Status: DC
  Administered 2017-12-28 – 2017-12-29 (×3): 30 mg via ORAL
  Filled 2017-12-28 (×5): qty 0.5

## 2017-12-28 MED ORDER — SODIUM CHLORIDE 0.9% FLUSH
3.0000 mL | Freq: Two times a day (BID) | INTRAVENOUS | Status: DC
  Administered 2017-12-28 – 2017-12-29 (×3): 3 mL via INTRAVENOUS

## 2017-12-28 MED ORDER — ACETAMINOPHEN 650 MG RE SUPP: 650.0000 mg | Freq: Four times a day (QID) | RECTAL | Status: DC | PRN

## 2017-12-28 MED ORDER — PANTOPRAZOLE SODIUM 40 MG PO TBEC
40.0000 mg | DELAYED_RELEASE_TABLET | Freq: Every day | ORAL | Status: DC
  Administered 2017-12-28 – 2017-12-29 (×2): 40 mg via ORAL
  Filled 2017-12-28 (×2): qty 1

## 2017-12-28 MED ORDER — SODIUM CHLORIDE 0.9% FLUSH: 3.0000 mL | INTRAVENOUS | Status: DC | PRN

## 2017-12-28 MED ORDER — ADULT MULTIVITAMIN W/MINERALS CH
1.0000 | ORAL_TABLET | Freq: Every day | ORAL | Status: DC
  Administered 2017-12-28 – 2017-12-29 (×2): 1 via ORAL
  Filled 2017-12-28 (×2): qty 1

## 2017-12-28 MED ORDER — FOLIC ACID 1 MG PO TABS
1.0000 mg | ORAL_TABLET | Freq: Every day | ORAL | Status: DC
  Administered 2017-12-28 – 2017-12-29 (×2): 1 mg via ORAL
  Filled 2017-12-28 (×2): qty 1

## 2017-12-28 MED ORDER — ONDANSETRON HCL 4 MG/2ML IJ SOLN: 4.0000 mg | Freq: Four times a day (QID) | INTRAMUSCULAR | Status: DC | PRN

## 2017-12-28 MED ORDER — SODIUM CHLORIDE 0.9 % IV SOLN: 250.0000 mL | INTRAVENOUS | Status: DC | PRN

## 2017-12-28 MED ORDER — PYRIDOSTIGMINE BROMIDE 60 MG PO TABS
30.0000 mg | ORAL_TABLET | Freq: Once | ORAL | Status: DC
  Filled 2017-12-28: qty 0.5

## 2017-12-28 MED ORDER — ENOXAPARIN SODIUM 40 MG/0.4ML ~~LOC~~ SOLN
40.0000 mg | SUBCUTANEOUS | Status: DC
  Administered 2017-12-28: 21:00:00 40 mg via SUBCUTANEOUS
  Filled 2017-12-28: qty 0.4

## 2017-12-28 MED ORDER — FINASTERIDE 5 MG PO TABS
5.0000 mg | ORAL_TABLET | Freq: Every day | ORAL | Status: DC
  Administered 2017-12-28 – 2017-12-29 (×2): 5 mg via ORAL
  Filled 2017-12-28 (×2): qty 1

## 2017-12-28 MED ORDER — ASPIRIN EC 81 MG PO TBEC
81.0000 mg | DELAYED_RELEASE_TABLET | Freq: Every day | ORAL | Status: DC
  Administered 2017-12-28 – 2017-12-29 (×2): 81 mg via ORAL
  Filled 2017-12-28 (×2): qty 1

## 2017-12-28 MED ORDER — VITAMIN B-12 1000 MCG PO TABS
500.0000 ug | ORAL_TABLET | Freq: Every day | ORAL | Status: DC
  Administered 2017-12-28 – 2017-12-29 (×2): 500 ug via ORAL
  Filled 2017-12-28 (×2): qty 1

## 2017-12-28 MED ORDER — BALSALAZIDE DISODIUM 750 MG PO CAPS
750.0000 mg | ORAL_CAPSULE | Freq: Three times a day (TID) | ORAL | Status: DC
  Administered 2017-12-28 (×2): 750 mg via ORAL
  Filled 2017-12-28 (×9): qty 1

## 2017-12-28 MED ORDER — POLYETHYLENE GLYCOL 3350 17 G PO PACK: 17.0000 g | PACK | Freq: Every day | ORAL | Status: DC | PRN

## 2017-12-28 MED ORDER — METHYLPREDNISOLONE SODIUM SUCC 125 MG IJ SOLR
60.0000 mg | Freq: Four times a day (QID) | INTRAMUSCULAR | Status: DC
  Administered 2017-12-28 – 2017-12-29 (×4): 60 mg via INTRAVENOUS
  Filled 2017-12-28 (×4): qty 2

## 2017-12-28 MED ORDER — ONDANSETRON HCL 4 MG PO TABS: 4.0000 mg | ORAL_TABLET | Freq: Four times a day (QID) | ORAL | Status: DC | PRN

## 2017-12-28 MED ORDER — ALBUTEROL SULFATE (2.5 MG/3ML) 0.083% IN NEBU: 2.5000 mg | INHALATION_SOLUTION | RESPIRATORY_TRACT | Status: DC | PRN

## 2017-12-28 NOTE — ED Notes (Signed)
Pt to ct at this time.

## 2017-12-28 NOTE — ED Triage Notes (Signed)
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

## 2017-12-28 NOTE — H&P (Signed)
[facility] NAME: Kyle Holloway    MR#:  [id number]  DATE OF BIRTH:  25-Mar-1940  DATE OF ADMISSION:  12/28/2017  PRIMARY CARE PHYSICIAN: Derinda Late, MD   REQUESTING/REFERRING PHYSICIAN:   CHIEF COMPLAINT:   Chief Complaint  Patient presents with  . Respiratory Distress    HISTORY OF PRESENT ILLNESS: Kyle Holloway  is a 77 y.o. male with a known history per below, presented to the emergency room with acute shortness of breath, patient also with 3-day period of increased weakness similar to previous episodes of exacerbation of his myasthenia gravis, eyelids felt heavy, inability to move his tongue while eating, patient also complaining of cough that is productive, room noted long-term history of smoking, in the emergency room patient was found to have peribronchial thickening/reactive airway disease, patient evaluated in the emergency room, family present, patient is now been admitted for acute probable mild COPD exacerbation as well as acute exacerbation of myasthenia gravis.  PAST MEDICAL HISTORY:   Past Medical History:  Diagnosis Date  . Myasthenia gravis ([place])     PAST SURGICAL HISTORY: History reviewed. No pertinent surgical history.  SOCIAL HISTORY:  Social History   Tobacco Use  . Smoking status: Former Research scientist (life sciences)  . Smokeless tobacco: Never Used  Substance Use Topics  . Alcohol use: Not Currently    FAMILY HISTORY: History reviewed. No pertinent family history.  DRUG ALLERGIES: No Known Allergies  REVIEW OF SYSTEMS:   CONSTITUTIONAL: No fever, fatigue or weakness.  EYES: No blurred or double vision.  EARS, NOSE, AND THROAT: No tinnitus or ear pain.  RESPIRATORY: + cough, shortness of breath, n0 wheezing or hemoptysis.  CARDIOVASCULAR: No chest pain, orthopnea, edema.  GASTROINTESTINAL: No nausea, vomiting, diarrhea or abdominal pain.  GENITOURINARY: No dysuria, hematuria.  ENDOCRINE: No polyuria, nocturia,   HEMATOLOGY: No anemia, easy bruising or bleeding SKIN: No rash or lesion. MUSCULOSKELETAL: No joint pain or arthritis.   NEUROLOGIC: Eyelid drooping, inability to move tongue   PSYCHIATRY: No anxiety or depression.   MEDICATIONS AT HOME:  Prior to Admission medications   Medication Sig Start Date End Date Taking? Authorizing Provider  aspirin EC 81 MG tablet Take 81 mg by mouth daily.    Yes [provider]  balsalazide (COLAZAL) 750 MG capsule Take 750 mg by mouth 3 (three) times daily. 09/24/17  Yes [provider]  Biotin 1 MG CAPS Take by mouth.   Yes [provider]  finasteride (PROSCAR) 5 MG tablet Take 5 mg by mouth daily. 12/12/17  Yes [provider]  folic acid (FOLVITE) 1 MG tablet Take 1 mg by mouth daily.  07/06/14  Yes [provider]  omeprazole (PRILOSEC) 40 MG capsule Take 40 mg by mouth daily. 12/13/17  Yes [provider]  tamsulosin (FLOMAX) 0.4 MG CAPS capsule TAKE 1 CAPSULE BY MOUTH ONCE DAILY TAKE 30 MINUTES AFTER THE SAME MEAL EACH DAY. 12/12/17  Yes [provider]  vitamin B-12 (CYANOCOBALAMIN) 500 MCG tablet Take 500 mcg by mouth daily.    Yes [provider]      PHYSICAL EXAMINATION:   VITAL SIGNS: Blood pressure (!) 141/92, pulse 63, temperature 97.8 F (36.6 C), temperature source Oral, resp. rate 18, height 5' 10"  (1.778 m), weight 84.8 kg, SpO2 100 %.  GENERAL:  77 y.o.-year-old patient lying in the bed with no acute distress.  EYES: Pupils equal, round, reactive to light and accommodation. No scleral icterus. Extraocular  muscles intact.  HEENT: Head atraumatic, normocephalic. Oropharynx and nasopharynx clear.  NECK:  Supple, no jugular venous distention. No thyroid enlargement, no tenderness.  LUNGS: Rhonchi bilaterally . No use of accessory muscles of respiration.  CARDIOVASCULAR: S1, S2 normal. No murmurs, rubs, or gallops.  ABDOMEN: Soft, nontender, nondistended. Bowel sounds  present. No organomegaly or mass.  EXTREMITIES: No pedal edema, cyanosis, or clubbing.  NEUROLOGIC: Cranial nerves II through XII are intact. Muscle strength 5/5 in all extremities. Sensation intact. Gait not checked.  PSYCHIATRIC: The patient is alert and oriented x 3.  SKIN: No obvious rash, lesion, or ulcer.   LABORATORY PANEL:   CBC Recent Labs  Lab 12/28/17 0510  WBC 8.9  HGB 14.7  HCT 43.3  PLT 337  MCV 93.3  MCH 31.7  MCHC 33.9  RDW 13.0  LYMPHSABS 2.1  MONOABS 0.8  EOSABS 0.3  BASOSABS 0.1   ------------------------------------------------------------------------------------------------------------------  Chemistries  Recent Labs  Lab 12/28/17 0510  NA 137  K 3.6  CL 103  CO2 26  GLUCOSE 97  BUN 13  CREATININE 0.94  CALCIUM 8.7*  AST 19  ALT 12  ALKPHOS 44  BILITOT 0.7   ------------------------------------------------------------------------------------------------------------------ estimated creatinine clearance is 68 mL/min (by C-G formula based on SCr of 0.94 mg/dL). ------------------------------------------------------------------------------------------------------------------ Recent Labs    12/28/17 0510  TSH 10.215*     Coagulation profile No results for input(s): INR, PROTIME in the last 168 hours. ------------------------------------------------------------------------------------------------------------------- No results for input(s): DDIMER in the last 72 hours. -------------------------------------------------------------------------------------------------------------------  Cardiac Enzymes Recent Labs  Lab 12/28/17 0510  TROPONINI <0.03   ------------------------------------------------------------------------------------------------------------------ Invalid input(s): POCBNP  ---------------------------------------------------------------------------------------------------------------  Urinalysis No results found for:  COLORURINE, APPEARANCEUR, LABSPEC, PHURINE, GLUCOSEU, HGBUR, BILIRUBINUR, KETONESUR, PROTEINUR, UROBILINOGEN, NITRITE, LEUKOCYTESUR   RADIOLOGY: Dg Chest 2 View  Result Date: 12/28/2017 CLINICAL DATA:  Difficulty breathing. History of myasthenia gravis. Symptoms for a few days. Shortness of breath. EXAM: CHEST - 2 VIEW COMPARISON:  CT chest 02/03/2010 FINDINGS: Normal heart size and pulmonary vascularity. No focal airspace disease or consolidation in the lungs. No blunting of costophrenic angles. No pneumothorax. Mediastinal contours appear intact. Peribronchial thickening may indicate bronchitis or reactive airways disease. IMPRESSION: Peribronchial thickening may indicate bronchitis or reactive airways disease. No consolidation or edema in the lungs. Electronically Signed   By: Lucienne Capers M.D.   On: 12/28/2017 06:07   Ct Head Wo Contrast  Result Date: 12/28/2017 CLINICAL DATA:  Facial droop. History of myasthenia gravis. Difficulty breathing. EXAM: CT HEAD WITHOUT CONTRAST TECHNIQUE: Contiguous axial images were obtained from the base of the skull through the vertex without intravenous contrast. COMPARISON:  None. FINDINGS: Brain: No evidence of acute infarction, hemorrhage, hydrocephalus, extra-axial collection or mass lesion/mass effect. Mild cerebral atrophy. Vascular: No hyperdense vessel or unexpected calcification. Skull: Calvarium appears intact. Sinuses/Orbits: Paranasal sinuses and left mastoid air cells are clear. Opacification of right mastoid air cells. Other: None. IMPRESSION: No acute intracranial abnormalities. Mild cerebral atrophy. Right mastoid effusions. Electronically Signed   By: Lucienne Capers M.D.   On: 12/28/2017 06:25   Mr Brain Wo Contrast  Result Date: 12/28/2017 CLINICAL DATA:  Ataxia with stroke suspected EXAM: MRI HEAD WITHOUT CONTRAST TECHNIQUE: Multiplanar, multiecho pulse sequences of the brain and surrounding structures were obtained without intravenous  contrast. COMPARISON:  Head CT from earlier today FINDINGS: Brain: No acute infarction, hemorrhage, hydrocephalus, extra-axial collection or mass lesion. Mild chronic small vessel ischemia in the cerebral white matter Vascular: Major flow voids are preserved Skull and upper  cervical spine: No evidence of marrow lesion Sinuses/Orbits: Right mastoid opacification with negative nasopharynx. IMPRESSION: 1. No acute finding. 2. Unremarkable MRI of the brain for age. 3. Right mastoid opacification. Electronically Signed   By: Monte Fantasia M.D.   On: 12/28/2017 11:02    EKG: Orders placed or performed in visit on 06/11/07  . EKG 12-Lead    IMPRESSION AND PLAN: *Acute COPD exacerbation, mild IV Solu-Medrol with tapering as tolerated, empiric doxycycline for 5-day course, inhaled corticosteroids twice daily, aggressive pulmonary toilet with bronchodilator therapy, supplemental oxygen PRN, continue close medical monitoring  *Acute myasthenia gravis exacerbation Case discussed with neurology-await further recommendations   *History of tobacco smoking abuse Patient congratulated on quitting smoking   All the records are reviewed and case discussed with ED provider. Management plans discussed with the patient, family and they are in agreement.  CODE STATUS: Full code    Code Status Orders  (From admission, onward)         Start     Ordered   12/28/17 0851  Full code  Continuous     12/28/17 0850        Code Status History    This patient has a current code status but no historical code status.    Advance Directive Documentation     Most Recent Value  Type of Advance Directive  Healthcare Power of Attorney, Living will  Pre-existing out of facility DNR order (yellow form or pink MOST form)  -  "MOST" Form in Place?  -       TOTAL TIME TAKING CARE OF THIS PATIENT:40 minutes.    Kyle Holloway M.D on 12/28/2017   Between 7am to 6pm - Pager - [phone]  After 6pm go to  [email or web address] - password [email or web address]  Office  [phone]  CC: Primary care physician; Derinda Late, MD   Note: This dictation was prepared with Dragon dictation along with smaller phrase technology. Any transcriptional errors that result from this process are unintentional.

## 2017-12-28 NOTE — Progress Notes (Signed)
NIF -38 cm.

## 2017-12-28 NOTE — Progress Notes (Signed)
NIF -38

## 2017-12-28 NOTE — Progress Notes (Signed)
Family Meeting Note  Advance Directive:yes  Today a meeting took place with the Patient.  Patient is able to participate   The following clinical team members were present during this meeting:MD  The following were discussed:Patient's diagnosis: , Patient's progosis: Unable to determine and Goals for treatment: Full Code  Additional follow-up to be provided: prn  Time spent during discussion:20 minutes  Gorden Harms, MD

## 2017-12-28 NOTE — ED Notes (Signed)
Admitting at bedside 

## 2017-12-28 NOTE — Progress Notes (Signed)
Called Pharmacy and sent two messages and called  about the patients balsalazide , pharmacy is trying to locate it

## 2017-12-28 NOTE — Consult Note (Signed)
Reason for Consult: Difficulty breathing, history of ocular myasthenia gravis Referring Physician: Salary, M  CC: Difficulty breathing  HPI: Kyle Holloway Sr. is an 77 y.o. male past medical history of chronic colitis, dysphagia, esophageal stricture, hypertension, GERD, GI bleed requiring multiple transfusions, osteoarthritis of knee, and ocular myasthenia gravis since 09/2009 presenting to the ED today (12/28/2017)  with complaints of difficulty breathing which he describes has " cannot get a deep breath and feels funny around her eyes and tongue". Patient state that for the past 1 week or more, he has had difficulty with swallowing, eye droopiness which tends to worsen towards the end of the day. He has been evaluated by neurologist in 2016 for symptoms of droopiness of his eyes which almost covers his eyes and difficulty with vision.  He does report blurriness of the vision mostly when he is looking to the right towards the later part of the day.  He did see Dr. George Ina in the past for eye droopiness and received reconstructive surgery with improvement but symptoms recurred in August 2011.  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 CT chest was negative for thymoma.  He saw a neurologist and was started on methotrexate 15 mg every week with folic acid on days not taking methotrexate, he apparently went off medication in 06/2012 with symptoms recurring in 11/2012.  His last visit with neurology was in 2016 since then he as not been followed by either ophthalmology or neurology. Patient report that he has been feeling fine up until a week ago when he started having difficulties with his vision.  Work-up in the ED with chest x-ray showed peribronchial thickening concerning for bronchitis or reactive airway disease.  CT head did not show acute intracranial abnormalities.  Mild cerebral atrophy and right  mastoid effusion are noted.  Labs revealed normal white count and CMP, TSH elevated 10.215.  Past Medical History:  Diagnosis Date  . Myasthenia gravis ([place])    History reviewed. No pertinent surgical history.  Family History  Problem Relation Age of Onset  . Holloway cancer Father     Social History:  reports that he has quit smoking. He has never used smokeless tobacco. He reports that he drank alcohol. His drug history is not on file.  No Known Allergies  Medications:  I have reviewed the patient's current medications. Prior to Admission:  Medications Prior to Admission  Medication Sig Dispense Refill Last Dose  . aspirin EC 81 MG tablet Take 81 mg by mouth daily.    12/27/2017 at Unknown time  . balsalazide (COLAZAL) 750 MG capsule Take 750 mg by mouth 3 (three) times daily.  3 12/27/2017 at Unknown time  . Biotin 1 MG CAPS Take by mouth.   12/27/2017 at Unknown time  . finasteride (PROSCAR) 5 MG tablet Take 5 mg by mouth daily.  3 12/27/2017 at Unknown time  . folic acid (FOLVITE) 1 MG tablet Take 1 mg by mouth daily.    12/27/2017 at Unknown time  . omeprazole (PRILOSEC) 40 MG capsule Take 40 mg by mouth daily.  3 12/27/2017 at Unknown time  . tamsulosin (FLOMAX) 0.4 MG CAPS capsule TAKE 1 CAPSULE BY MOUTH ONCE DAILY TAKE 30 MINUTES AFTER THE SAME MEAL EACH DAY.  3 12/27/2017 at Unknown time  . vitamin B-12 (CYANOCOBALAMIN) 500 MCG tablet Take 500 mcg by mouth daily.    12/27/2017 at Unknown time  Scheduled: . aspirin EC  81 mg Oral Daily  . balsalazide  750 mg Oral TID  . budesonide (PULMICORT) nebulizer solution  0.5 mg Nebulization BID  . enoxaparin (LOVENOX) injection  40 mg Subcutaneous Q24H  . finasteride  5 mg Oral Daily  . folic acid  1 mg Oral Daily  . methylPREDNISolone (SOLU-MEDROL) injection  60 mg Intravenous Q6H  . multivitamin with minerals  1 tablet Oral Daily  . pantoprazole  40 mg Oral Daily  . pyridostigmine  30 mg Oral Q8H  . sodium chloride flush  3  mL Intravenous Q12H  . tamsulosin  0.4 mg Oral QPC supper  . vitamin B-12  500 mcg Oral Daily    ROS: History obtained from the patient   General ROS: negative for - chills, fatigue, fever, night sweats, weight gain or weight loss Psychological ROS: negative for - behavioral disorder, hallucinations, memory difficulties, mood swings or suicidal ideation Ophthalmic ROS: negative for - eye pain or loss of vision. Positive for blurry vision, double vision. ENT ROS: negative for - epistaxis, nasal discharge, oral lesions, sore throat, tinnitus or vertigo Allergy and Immunology ROS: negative for - hives or itchy/watery eyes Hematological and Lymphatic ROS: negative for - bleeding problems, bruising or swollen lymph nodes Endocrine ROS: negative for - galactorrhea, hair pattern changes, polydipsia/polyuria or temperature intolerance Respiratory ROS: negative for - cough, hemoptysis, wheezing. Positive for shortness of breath. Cardiovascular ROS: negative for - chest pain, dyspnea on exertion, edema or irregular heartbeat Gastrointestinal ROS: negative for - abdominal pain, diarrhea, hematemesis, nausea/vomiting or stool incontinence Genito-Urinary ROS: negative for - dysuria, hematuria, incontinence or urinary frequency/urgency Musculoskeletal ROS: negative for - joint swelling or muscular weakness Neurological ROS: as noted in HPI Dermatological ROS: negative for rash and skin lesion changes  Physical Examination: Blood pressure (!) 143/92, pulse 72, temperature (!) 97.4 F (36.3 C), temperature source Oral, resp. rate (!) 22, height 5' 10"  (1.778 m), weight 84.8 kg, SpO2 96 %.  HEENT-  Normocephalic, no lesions, without obvious abnormality.  Normal external eye and conjunctiva.  Normal TM's bilaterally.  Normal auditory canals and external ears. Normal external nose, mucus membranes and septum.  Normal pharynx. Cardiovascular- S1, S2 normal, pulses palpable throughout   Lungs- chest clear,  no wheezing, rales, normal symmetric air entry Abdomen- soft, non-tender; bowel sounds normal; no masses,  no organomegaly Extremities- no edema Lymph-no adenopathy palpable Musculoskeletal-no joint tenderness, deformity or swelling Skin-warm and dry, no hyperpigmentation, vitiligo, or suspicious lesions  Neurological Exam   Mental Status: Alert, oriented, thought content appropriate.  Speech fluent without evidence of aphasia.  Able to follow 3 step commands without difficulty. Attention span and concentration seemed appropriate  Cranial Nerves: II: Discs flat bilaterally; Visual fields grossly normal, pupils equal, round, reactive to light and accommodation III,IV, VI: ptosis present right>left, extra-ocular motions intact bilaterally V,VII: smile symmetric, facial light touch sensation intact VIII: hearing normal bilaterally IX,X: gag reflex present XI: bilateral shoulder shrug XII: midline tongue extension Motor: Right :  Upper extremity   5/5 Without pronator drift      Left: Upper extremity   5/5 without pronator drift Right:   Lower extremity   5/5                                          Left: Lower extremity   5/5 Tone and bulk:normal tone throughout;  no atrophy noted Sensory: Pinprick and light touch intact bilaterally Deep Tendon Reflexes: 2+ and symmetric throughout Plantars: Right: mute                              Left: mute Cerebellar: Finger-to-nose testing intact bilaterally. Heel to shin testing normal bilaterally Gait: not tested due to safety concerns  Data Reviewed  Laboratory Studies:   Basic Metabolic Panel: Recent Labs  Lab 12/28/17 0510  NA 137  K 3.6  CL 103  CO2 26  GLUCOSE 97  BUN 13  CREATININE 0.94  CALCIUM 8.7*    Liver Function Tests: Recent Labs  Lab 12/28/17 0510  AST 19  ALT 12  ALKPHOS 44  BILITOT 0.7  PROT 7.8  ALBUMIN 4.1   No results for input(s): LIPASE, AMYLASE in the last 168 hours. No results for input(s): AMMONIA  in the last 168 hours.  CBC: Recent Labs  Lab 12/28/17 0510  WBC 8.9  NEUTROABS 5.7  HGB 14.7  HCT 43.3  MCV 93.3  PLT 337    Cardiac Enzymes: Recent Labs  Lab 12/28/17 0510  TROPONINI <0.03    BNP: Invalid input(s): POCBNP  CBG: No results for input(s): GLUCAP in the last 168 hours.  Microbiology: No results found for this or any previous visit.  Coagulation Studies: No results for input(s): LABPROT, INR in the last 72 hours.  Urinalysis: No results for input(s): COLORURINE, LABSPEC, PHURINE, GLUCOSEU, HGBUR, BILIRUBINUR, KETONESUR, PROTEINUR, UROBILINOGEN, NITRITE, LEUKOCYTESUR in the last 168 hours.  Invalid input(s): APPERANCEUR  Lipid Panel:  No results found for: CHOL, TRIG, HDL, CHOLHDL, VLDL, LDLCALC  HgbA1C: No results found for: HGBA1C  Urine Drug Screen:  No results found for: LABOPIA, COCAINSCRNUR, LABBENZ, AMPHETMU, THCU, LABBARB  Alcohol Level: No results for input(s): ETH in the last 168 hours.  Other results: EKG: there are no previous tracings available for comparison.  Imaging: Dg Chest 2 View  Result Date: 12/28/2017 CLINICAL DATA:  Difficulty breathing. History of myasthenia gravis. Symptoms for a few days. Shortness of breath. EXAM: CHEST - 2 VIEW COMPARISON:  CT chest 02/03/2010 FINDINGS: Normal heart size and pulmonary vascularity. No focal airspace disease or consolidation in the lungs. No blunting of costophrenic angles. No pneumothorax. Mediastinal contours appear intact. Peribronchial thickening may indicate bronchitis or reactive airways disease. IMPRESSION: Peribronchial thickening may indicate bronchitis or reactive airways disease. No consolidation or edema in the lungs. Electronically Signed   By: Lucienne Capers M.D.   On: 12/28/2017 06:07   Ct Head Wo Contrast  Result Date: 12/28/2017 CLINICAL DATA:  Facial droop. History of myasthenia gravis. Difficulty breathing. EXAM: CT HEAD WITHOUT CONTRAST TECHNIQUE: Contiguous axial  images were obtained from the base of the skull through the vertex without intravenous contrast. COMPARISON:  None. FINDINGS: Holloway: No evidence of acute infarction, hemorrhage, hydrocephalus, extra-axial collection or mass lesion/mass effect. Mild cerebral atrophy. Vascular: No hyperdense vessel or unexpected calcification. Skull: Calvarium appears intact. Sinuses/Orbits: Paranasal sinuses and left mastoid air cells are clear. Opacification of right mastoid air cells. Other: None. IMPRESSION: No acute intracranial abnormalities. Mild cerebral atrophy. Right mastoid effusions. Electronically Signed   By: Lucienne Capers M.D.   On: 12/28/2017 06:25   Kyle Holloway Wo Contrast  Result Date: 12/28/2017 CLINICAL DATA:  Ataxia with stroke suspected EXAM: MRI HEAD WITHOUT CONTRAST TECHNIQUE: Multiplanar, multiecho pulse sequences of the Holloway and surrounding structures were obtained without intravenous contrast. COMPARISON:  Head  CT from earlier today FINDINGS: Holloway: No acute infarction, hemorrhage, hydrocephalus, extra-axial collection or mass lesion. Mild chronic small vessel ischemia in the cerebral white matter Vascular: Major flow voids are preserved Skull and upper cervical spine: No evidence of marrow lesion Sinuses/Orbits: Right mastoid opacification with negative nasopharynx. IMPRESSION: 1. No acute finding. 2. Unremarkable MRI of the Holloway for age. 3. Right mastoid opacification. Electronically Signed   By: Monte Fantasia M.D.   On: 12/28/2017 11:02   Assessments: 77 y.o male with past medical history of chronic colitis, dysphagia, esophageal stricture, hypertension, GERD, GI bleed requiring multiple transfusions, osteoarthritis of knee, and ocular myasthenia gravis since 09/2009 presenting to the ED today (12/28/2017)  with complaints of difficulty breathing which he describes has " cannot get a deep breath and feels funny around her eyes and tongue". Presentation concerning for respiratory muscles  involvement which is rare but can occur in patients with ocular myasthenia gravis.  Initial chest x-ray showed peribronchial thickening with no consolidation or edema noted.  CT head showed no acute intracranial abnormality.  MRI Holloway reviewed and show no acute finding.  Right mastoid effusion noted.  Patient previously treated with methotrexate for ocular myasthenia gravis with improvement in symptoms however this was DC'd as patient was stable with no recurring symptoms until recently.  Patient does not appear to be in acute respiratory distress therefore no steroid indicated at this time which can exacerbate myasthenia gravis symptoms as well.  Further work-up recommended and monitoring of respiratory symptoms.  Plan 1.  Start Mestinon 30 mg p.o. TID 2.  CT chest without contrast for evaluation of interstitial lung disease or other underlying pulmonary disease 3.  Check PFTs for baseline pulmonary function 4.  Start methotrexate at 5 mg once a week to be adjusted by patient's outpatient neurologist. 5.  This patient was discussed with patient's outpatient neurologist Dr.Hemang Manuella Ghazi who has agreed to see patient for follow-up after discharge.  This patient was staffed with Dr. Irish Elders, Alease Frame who personally evaluated patient, reviewed documentation and agreed with assessment and plan of care as above.  Rufina Falco, DNP, FNP-BC Board certified Nurse Practitioner Neurology Department  12/28/2017, 2:15 PM

## 2017-12-28 NOTE — Evaluation (Signed)
Physical Therapy Evaluation Patient Details Name: Kyle BRIMAGE Sr. MRN: [id number] DOB: 1940-06-09 Today's Date: 12/28/2017   History of Present Illness  77 yo male with onset of ataxia and exacerbation of COPD was admitted, has chronic issues of myasthenia gravis.  Had SOB, has increased weakness of eye mm's, mouth weakness and productive cough.  PMHx:  smoker, myasthenia gravis, atherosclerosis, aortic aneurysm, emphysema, COPD, colitis   Clinical Impression  Pt is up to walk with supervision, noted his gait is changed but not causing LOB.  Pt is not open to trying any follow up therapy after visit today and so will not continue on with him acutely. He is expecting to go home in the next day so will anticipate no further acute therapy unless pt changes his mind about working on RLE coordination.      Follow Up Recommendations No PT follow up    Equipment Recommendations  None recommended by PT    Recommendations for Other Services       Precautions / Restrictions Precautions Precautions: Fall Restrictions Weight Bearing Restrictions: No      Mobility  Bed Mobility Overal bed mobility: Modified Independent                Transfers Overall transfer level: Modified independent                  Ambulation/Gait Ambulation/Gait assistance: Supervision Gait Distance (Feet): 200 Feet Assistive device: None Gait Pattern/deviations: Step-through pattern;Decreased stride length;Drifts right/left;Wide base of support Gait velocity: reduced Gait velocity interpretation: <1.31 ft/sec, indicative of household ambulator General Gait Details: pt has some changes of gait with no LOB, but mild coordination changes of RLE  Stairs Stairs: Yes Stairs assistance: Min guard Stair Management: One rail Right;Forwards;Alternating pattern Number of Stairs: 6 General stair comments: navigates with no LOB  Wheelchair Mobility    Modified Rankin (Stroke Patients Only)        Balance                                             Pertinent Vitals/Pain Pain Assessment: No/denies pain    Home Living Family/patient expects to be discharged to:: Private residence Living Arrangements: Spouse/significant other Available Help at Discharge: Family;Available 24 hours/day Type of Home: House Home Access: Stairs to enter Entrance Stairs-Rails: Right;Left;Can reach both Entrance Stairs-Number of Steps: 5 Home Layout: One level Home Equipment: None      Prior Function Level of Independence: Independent         Comments: worked on [employer] all summer     Hand Dominance        Extremity/Trunk Assessment   Upper Extremity Assessment Upper Extremity Assessment: Overall WFL for tasks assessed    Lower Extremity Assessment Lower Extremity Assessment: Overall WFL for tasks assessed    Cervical / Trunk Assessment Cervical / Trunk Assessment: Normal  Communication   Communication: No difficulties  Cognition Arousal/Alertness: Awake/alert Behavior During Therapy: WFL for tasks assessed/performed Overall Cognitive Status: Within Functional Limits for tasks assessed                                        General Comments      Exercises     Assessment/Plan    PT  Assessment Patent does not need any further PT services  PT Problem List         PT Treatment Interventions      PT Goals (Current goals can be found in the Care Plan section)  Acute Rehab PT Goals PT Goal Formulation: All assessment and education complete, DC therapy    Frequency     Barriers to discharge        Co-evaluation               AM-PAC PT "6 Clicks" Mobility  Outcome Measure Help needed turning from your back to your side while in a flat bed without using bedrails?: None Help needed moving from lying on your back to sitting on the side of a flat bed without using bedrails?: None Help needed moving  to and from a bed to a chair (including a wheelchair)?: None Help needed standing up from a chair using your arms (e.g., wheelchair or bedside chair)?: None Help needed to walk in hospital room?: None Help needed climbing 3-5 steps with a railing? : A Little 6 Click Score: 23    End of Session Equipment Utilized During Treatment: Gait belt Activity Tolerance: Patient tolerated treatment well Patient left: in bed;with call bell/phone within reach;with family/visitor present Nurse Communication: Mobility status PT Visit Diagnosis: Other abnormalities of gait and mobility (R26.89)    Time: 5885-0277 PT Time Calculation (min) (ACUTE ONLY): 14 min   Charges:   PT Evaluation $PT Eval Moderate Complexity: 1 Mod         Ramond Dial 12/28/2017, 7:50 PM   Mee Hives, PT MS Acute Rehab Dept. Number: [place]

## 2017-12-28 NOTE — ED Provider Notes (Signed)
[facility] Emergency Department Provider Note   ____________________________________________   First MD Initiated Contact with Patient 12/28/17 [phone]     (approximate)  I have reviewed the triage vital signs and the nursing notes.   HISTORY  Chief Complaint Respiratory Distress    HPI Kyle CHILLEMI Sr. is a 77 y.o. male who presents to the ED from home with a chief complaint of difficulty breathing.  Patient has a history of ocular myasthenia gravis.  Followed by ophthalmology and neurology.  Has not seen his ophthalmologist in 2 years as his symptoms have been well controlled.  Has not has seen his neurologist in multiple years.  Used to be on methotrexate but has not taken it in years.  Over the past 2 to 3 days, patient has noted drooping of his eyelids, right greater than left.  Yesterday afternoon he was eating cake and try to push the cake around in his mouth with his tongue but his tongue felt quite heavy.  Feels like he is having difficulty breathing as well.  Denies recent fever, chills, chest pain, abdominal pain, nausea or vomiting.  Denies slurred speech or extremity weakness.   Past Medical History:  Diagnosis Date  . Myasthenia gravis [facility])     Patient Active Problem List   Diagnosis Date Noted  . 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 ACID REFLUX DISEASE 10/20/2007  . HIATAL HERNIA 10/20/2007  . CROHN'S DISEASE 10/20/2007  . DIVERTICULOSIS OF COLON 10/20/2007  . 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 Authorizing Provider  aspirin EC 81 MG tablet Take 81 mg by mouth daily.    Yes [provider]  balsalazide (COLAZAL) 750 MG capsule Take 750 mg by mouth 3 (three) times daily. 09/24/17  Yes [provider]  Biotin 1 MG CAPS Take by mouth.   Yes [provider]  finasteride (PROSCAR) 5 MG tablet Take 5 mg by mouth daily.  12/12/17  Yes [provider]  folic acid (FOLVITE) 1 MG tablet Take 1 mg by mouth daily.  07/06/14  Yes [provider]  omeprazole (PRILOSEC) 40 MG capsule Take 40 mg by mouth daily. 12/13/17  Yes [provider]  tamsulosin (FLOMAX) 0.4 MG CAPS capsule TAKE 1 CAPSULE BY MOUTH ONCE DAILY TAKE 30 MINUTES AFTER THE SAME MEAL EACH DAY. 12/12/17  Yes [provider]  vitamin B-12 (CYANOCOBALAMIN) 500 MCG tablet Take 500 mcg by mouth daily.    Yes [provider]    Allergies Patient has no known allergies.  No family history on file.  Social History Social History   Tobacco Use  . Smoking status: Former Research scientist (life sciences)  . Smokeless tobacco: Never Used  Substance Use Topics  . Alcohol use: Not Currently  . Drug use: Not on file    Review of Systems  Constitutional: No fever/chills Eyes: No visual changes. ENT: Positive for feeling of tongue heaviness.  No sore throat. Cardiovascular: Denies chest pain. Respiratory: Positive for shortness of breath. Gastrointestinal: No abdominal pain.  No nausea, no vomiting.  No diarrhea.  No constipation. Genitourinary: Negative for dysuria. Musculoskeletal: Negative for back pain. Skin: Negative for rash. Neurological: Negative for headaches, focal weakness or numbness.   ____________________________________________   PHYSICAL EXAM:  VITAL SIGNS: ED Triage Vitals  Enc Vitals Group     BP 12/28/17 0501 (!) 155/103     Pulse Rate 12/28/17 0501  62     Resp 12/28/17 0501 20     Temp 12/28/17 0501 97.6 F (36.4 C)     Temp Source 12/28/17 0501 Oral     SpO2 12/28/17 0501 99 %     Weight 12/28/17 0459 187 lb (84.8 kg)     Height 12/28/17 0459 5' 10"  (1.778 m)     Head Circumference --      Peak Flow --      Pain Score 12/28/17 0458 0     Pain Loc --      Pain Edu? --      Excl. in [place]? --     Constitutional: Alert and oriented. Well appearing and in mild acute distress. Eyes: Conjunctivae are  normal. PERRL. EOMI. Right ptosis. Head: Atraumatic. Nose: No congestion/rhinnorhea. Mouth/Throat: Mucous membranes are moist.  No drooling.  Tolerating secretions well. Neck: No stridor.  No carotid bruits. Cardiovascular: Normal rate, regular rhythm. Grossly normal heart sounds.  Good peripheral circulation. Respiratory: Normal respiratory effort.  No retractions. Lungs CTAB. Gastrointestinal: Soft and nontender. No distention. No abdominal bruits. No CVA tenderness. Musculoskeletal: No lower extremity tenderness nor edema.  No joint effusions. Neurologic: Alert and oriented x3.  Mild left facial droop noted.  Right ptosis noted.  Normal speech and language. No gross focal neurologic deficits are appreciated.  5/5 motor strength and sensation all extremities.  No gait instability. Skin:  Skin is warm, dry and intact. No rash noted. Psychiatric: Mood and affect are normal. Speech and behavior are normal.  ____________________________________________   LABS (all labs ordered are listed, but only abnormal results are displayed)  Labs Reviewed  COMPREHENSIVE METABOLIC PANEL - Abnormal; Notable for the following components:      Result Value   Calcium 8.7 (*)    All other components within normal limits  CBC WITH DIFFERENTIAL/PLATELET  TROPONIN I   ____________________________________________  EKG  ED ECG REPORT I, Amiel Sharrow J, the attending physician, personally viewed and interpreted this ECG.   Date: 12/28/2017  EKG Time: 0502  Rate: 63  Rhythm: normal EKG, normal sinus rhythm  Axis: Normal  Intervals:none  ST&T Change: Nonspecific  ____________________________________________  RADIOLOGY  ED MD interpretation: No acute cardiopulmonary process; no ICH  Official radiology report(s): Dg Chest 2 View  Result Date: 12/28/2017 CLINICAL DATA:  Difficulty breathing. History of myasthenia gravis. Symptoms for a few days. Shortness of breath. EXAM: CHEST - 2 VIEW COMPARISON:   CT chest 02/03/2010 FINDINGS: Normal heart size and pulmonary vascularity. No focal airspace disease or consolidation in the lungs. No blunting of costophrenic angles. No pneumothorax. Mediastinal contours appear intact. Peribronchial thickening may indicate bronchitis or reactive airways disease. IMPRESSION: Peribronchial thickening may indicate bronchitis or reactive airways disease. No consolidation or edema in the lungs. Electronically Signed   By: Lucienne Capers M.D.   On: 12/28/2017 06:07   Ct Head Wo Contrast  Result Date: 12/28/2017 CLINICAL DATA:  Facial droop. History of myasthenia gravis. Difficulty breathing. EXAM: CT HEAD WITHOUT CONTRAST TECHNIQUE: Contiguous axial images were obtained from the base of the skull through the vertex without intravenous contrast. COMPARISON:  None. FINDINGS: Brain: No evidence of acute infarction, hemorrhage, hydrocephalus, extra-axial collection or mass lesion/mass effect. Mild cerebral atrophy. Vascular: No hyperdense vessel or unexpected calcification. Skull: Calvarium appears intact. Sinuses/Orbits: Paranasal sinuses and left mastoid air cells are clear. Opacification of right mastoid air cells. Other: None. IMPRESSION: No acute intracranial abnormalities. Mild cerebral atrophy. Right mastoid effusions. Electronically Signed   By:  Lucienne Capers M.D.   On: 12/28/2017 06:25    ____________________________________________   PROCEDURES  Procedure(s) performed:   NIH Stroke Scale  Interval: On examination Time: 7:29 AM Person Administering Scale: Ardel Jagger J  Administer stroke scale items in the order listed. Record performance in each category after each subscale exam. Do not go back and change scores. Follow directions provided for each exam technique. Scores should reflect what the patient does, not what the clinician thinks the patient can do. The clinician should record answers while administering the exam and work quickly. Except where  indicated, the patient should not be coached (i.e., repeated requests to patient to make a special effort).   1a  Level of consciousness: 0=alert; keenly responsive  1b. LOC questions:  0=Performs both tasks correctly  1c. LOC commands: 0=Performs both tasks correctly  2.  Best Gaze: 0=normal  3.  Visual: 0=No visual loss  4. Facial Palsy: 1=Minor paralysis (flattened nasolabial fold, asymmetric on smiling)  5a.  Motor left arm: 0=No drift, limb holds 90 (or 45) degrees for full 10 seconds  5b.  Motor right arm: 0=No drift, limb holds 90 (or 45) degrees for full 10 seconds  6a. motor left leg: 0=No drift, limb holds 90 (or 45) degrees for full 10 seconds  6b  Motor right leg:  0=No drift, limb holds 90 (or 45) degrees for full 10 seconds  7. Limb Ataxia: 0=Absent  8.  Sensory: 0=Normal; no sensory loss  9. Best Language:  0=No aphasia, normal  10. Dysarthria: 0=Normal  11. Extinction and Inattention: 0=No abnormality  12. 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  Differential diagnosis includes but is not limited to exacerbation of myasthenia gravis, TIA, CVA, infectious, metabolic etiologies, etc.  Will obtain screening lab work, CT head to evaluate for intracranial abnormalities.  Nursing to perform swallow screen. Will reassess.  Patient is not a candidate for TPA as this is not a clear-cut  CVA, he is out of the time window, and does not meet criteria based on NIH stroke scale.  Clinical Course as of Dec 29 727  Sat Dec 28, 2017  0635 Updated patient and spouse of all test results.  Patient passed his swallow screen.  Discussed with hospitalist who will evaluate patient in the emergency department for admission.   [JS]    Clinical Course User Index [JS] Paulette Blanch, MD     ____________________________________________   FINAL CLINICAL IMPRESSION(S) / ED DIAGNOSES  Final diagnoses:  Shortness of breath  Ocular myasthenia gravis [facility])     ED Discharge Orders    None       Note:  This document was prepared using Dragon voice recognition software and may include unintentional dictation errors.    Paulette Blanch, MD 12/28/17 [phone]

## 2017-12-28 NOTE — ED Notes (Signed)
Alert and oriented, unlabored. No needs.

## 2017-12-28 NOTE — ED Triage Notes (Signed)
77 yo male presents to ED with history of Myasthenia Gravis and states he is having difficulty breathing (can't get a deep breath and feels funny around eyes and tongue). Onset of symptoms x a few days. Patient is currently alert and oriented, able to answer questions without obvious signs of respiratory distress. Patient's color is normal for ethnicity. Denies N/V, CP or fever.

## 2017-12-29 DIAGNOSIS — G7001 Myasthenia gravis with (acute) exacerbation: Secondary | ICD-10-CM | POA: Diagnosis not present

## 2017-12-29 DIAGNOSIS — R0602 Shortness of breath: Secondary | ICD-10-CM | POA: Diagnosis not present

## 2017-12-29 MED ORDER — ALBUTEROL SULFATE HFA 108 (90 BASE) MCG/ACT IN AERS: 2.0000 | INHALATION_SPRAY | Freq: Four times a day (QID) | RESPIRATORY_TRACT | 0 refills | Status: AC | PRN

## 2017-12-29 MED ORDER — PYRIDOSTIGMINE BROMIDE 60 MG PO TABS: 30.0000 mg | ORAL_TABLET | Freq: Three times a day (TID) | ORAL | 0 refills | Status: DC

## 2017-12-29 MED ORDER — METHOTREXATE 2.5 MG PO TABS: 5.0000 mg | ORAL_TABLET | ORAL | 0 refills | Status: AC

## 2017-12-29 MED ORDER — PREDNISONE 5 MG (21) PO TBPK: ORAL_TABLET | ORAL | 0 refills | Status: DC

## 2017-12-29 NOTE — Discharge Summary (Signed)
[facility] [place] NAME: Kyle Holloway    MR#:  [id number]  DATE OF BIRTH:  [birth year]  DATE OF ADMISSION:  12/28/2017 ADMITTING PHYSICIAN: Avel Peace , MD  DATE OF DISCHARGE: No discharge date for patient encounter.  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 Myasthenia gravis [facility])    HOSPITAL COURSE:  *Acute COPD exacerbation, mild Resolved Exacerbated by myasthenia gravis Treated with IV Solu-Medrol, provided empiric doxycycline, inhaled corticosteroids twice daily, aggressive pulmonary toilet with bronchodilator therapy, excessively weaned off oxygen, and patient did well   *Acute myasthenia gravis exacerbation Resolved Neurology did see patient while in house, patient started on Mestinon and methotrexate Patient to follow-up with neurology status post discharge in 1 week for reevaluation   *History of tobacco smoking abuse Patient congratulated on quitting smoking   DISCHARGE CONDITIONS:   stable  CONSULTS OBTAINED:  Treatment Team:  Leotis Pain, MD  DRUG ALLERGIES:  No Known Allergies  DISCHARGE MEDICATIONS:   Allergies as of 12/29/2017   No Known Allergies     Medication List    TAKE these medications   albuterol 108 (90 Base) MCG/ACT inhaler Commonly known as:  PROVENTIL HFA;VENTOLIN HFA Inhale 2 puffs into the lungs every 6 (six) hours as needed for wheezing or shortness of breath.   aspirin EC 81 MG tablet Take 81 mg by mouth daily.   balsalazide 750 MG capsule Commonly known as:  COLAZAL Take 750 mg by mouth 3 (three) times daily.   Biotin 1 MG Caps Take by mouth.   finasteride 5 MG tablet Commonly known as:  PROSCAR Take 5 mg by mouth daily.    folic acid 1 MG tablet Commonly known as:  FOLVITE Take 1 mg by mouth daily.   methotrexate 2.5 MG tablet Commonly known as:  RHEUMATREX Take 2 tablets (5 mg total) by mouth once a week. Every Wednesday  Caution:Chemotherapy. Protect from light.   omeprazole 40 MG capsule Commonly known as:  PRILOSEC Take 40 mg by mouth daily.   predniSONE 5 MG (21) Tbpk tablet Commonly known as:  STERAPRED UNI-PAK 21 TAB Take as directed   pyridostigmine 60 MG tablet Commonly known as:  MESTINON Take 0.5 tablets (30 mg total) by mouth every 8 (eight) hours.   tamsulosin 0.4 MG Caps capsule Commonly known as:  FLOMAX TAKE 1 CAPSULE BY MOUTH ONCE DAILY TAKE 30 MINUTES AFTER THE SAME MEAL EACH DAY.   vitamin B-12 500 MCG tablet Commonly known as:  CYANOCOBALAMIN Take 500 mcg by mouth daily.        DISCHARGE INSTRUCTIONS:      If you experience worsening of your admission symptoms, develop shortness of breath, life threatening emergency, suicidal or homicidal thoughts you must seek medical attention immediately by calling 911 or calling your MD immediately  if symptoms less severe.  You Must read complete instructions/literature along with all the possible adverse reactions/side effects for all the Medicines you take and that have been prescribed to you. Take any new Medicines after you have completely understood and accept all the possible adverse reactions/side effects.   Please note  You were cared for by a hospitalist during your hospital stay. If you have any questions about your discharge medications or  the care you received while you were in the hospital after you are discharged, you can call the unit and asked to speak with the hospitalist on call if the hospitalist that took care of you is not available. Once you are discharged, your primary care physician will handle any further medical issues. Please note that NO REFILLS for any discharge medications will be authorized once you  are discharged, as it is imperative that you return to your primary care physician (or establish a relationship with a primary care physician if you do not have one) for your aftercare needs so that they can reassess your need for medications and monitor your lab values.    Today   CHIEF COMPLAINT:   Chief Complaint  Patient presents with  . Respiratory Distress    HISTORY OF PRESENT ILLNESS:  77 y.o. male with a known history per below, presented to the emergency room with acute shortness of breath, patient also with 3-day period of increased weakness similar to previous episodes of exacerbation of his myasthenia gravis, eyelids felt heavy, inability to move his tongue while eating, patient also complaining of cough that is productive, room noted long-term history of smoking, in the emergency room patient was found to have peribronchial thickening/reactive airway disease, patient evaluated in the emergency room, family present, patient is now been admitted for acute probable mild COPD exacerbation as well as acute exacerbation of myasthenia gravis.   VITAL SIGNS:  Blood pressure (!) 135/93, pulse 96, temperature (!) 97.5 F (36.4 C), temperature source Oral, resp. rate 18, height 5' 10"  (1.778 m), weight 84.8 kg, SpO2 95 %.  I/O:    Intake/Output Summary (Last 24 hours) at 12/29/2017 0921 Last data filed at 12/28/2017 1836 Gross per 24 hour  Intake 720 ml  Output -  Net 720 ml    PHYSICAL EXAMINATION:  GENERAL:  77 y.o.-year-old patient lying in the bed with no acute distress.  EYES: Pupils equal, round, reactive to light and accommodation. No scleral icterus. Extraocular muscles intact.  HEENT: Head atraumatic, normocephalic. Oropharynx and nasopharynx clear.  NECK:  Supple, no jugular venous distention. No thyroid enlargement, no tenderness.  LUNGS: Normal breath sounds bilaterally, no wheezing, rales,rhonchi or crepitation. No use of accessory muscles of respiration.   CARDIOVASCULAR: S1, S2 normal. No murmurs, rubs, or gallops.  ABDOMEN: Soft, non-tender, non-distended. Bowel sounds present. No organomegaly or mass.  EXTREMITIES: No pedal edema, cyanosis, or clubbing.  NEUROLOGIC: Cranial nerves II through XII are intact. Muscle strength 5/5 in all extremities. Sensation intact. Gait not checked.  PSYCHIATRIC: The patient is alert and oriented x 3.  SKIN: No obvious rash, lesion, or ulcer.   DATA REVIEW:   CBC Recent Labs  Lab 12/28/17 0510  WBC 8.9  HGB 14.7  HCT 43.3  PLT 337    Chemistries  Recent Labs  Lab 12/28/17 0510  NA 137  K 3.6  CL 103  CO2 26  GLUCOSE 97  BUN 13  CREATININE 0.94  CALCIUM 8.7*  AST 19  ALT 12  ALKPHOS 44  BILITOT 0.7    Cardiac Enzymes Recent Labs  Lab 12/28/17 0510  TROPONINI <0.03    Microbiology Results  No results found for this or any previous visit.  RADIOLOGY:  Dg Chest 2 View  Result Date: 12/28/2017 CLINICAL DATA:  Difficulty breathing. History of myasthenia gravis. Symptoms for a few days. Shortness of breath. EXAM: CHEST - 2 VIEW COMPARISON:  CT chest 02/03/2010 FINDINGS: Normal heart size and  pulmonary vascularity. No focal airspace disease or consolidation in the lungs. No blunting of costophrenic angles. No pneumothorax. Mediastinal contours appear intact. Peribronchial thickening may indicate bronchitis or reactive airways disease. IMPRESSION: Peribronchial thickening may indicate bronchitis or reactive airways disease. No consolidation or edema in the lungs. Electronically Signed   By: Lucienne Capers M.D.   On: 12/28/2017 06:07   Ct Head Wo Contrast  Result Date: 12/28/2017 CLINICAL DATA:  Facial droop. History of myasthenia gravis. Difficulty breathing. EXAM: CT HEAD WITHOUT CONTRAST TECHNIQUE: Contiguous axial images were obtained from the base of the skull through the vertex without intravenous contrast. COMPARISON:  None. FINDINGS: Brain: No evidence of acute infarction,  hemorrhage, hydrocephalus, extra-axial collection or mass lesion/mass effect. Mild cerebral atrophy. Vascular: No hyperdense vessel or unexpected calcification. Skull: Calvarium appears intact. Sinuses/Orbits: Paranasal sinuses and left mastoid air cells are clear. Opacification of right mastoid air cells. Other: None. IMPRESSION: No acute intracranial abnormalities. Mild cerebral atrophy. Right mastoid effusions. Electronically Signed   By: Lucienne Capers M.D.   On: 12/28/2017 06:25   Ct Chest Wo Contrast  Result Date: 12/28/2017 CLINICAL DATA:  Shortness of breath, increased weakness, history myasthenia gravis, former smoker, Crohn's disease, GERD EXAM: CT CHEST WITHOUT CONTRAST TECHNIQUE: Multidetector CT imaging of the chest was performed following the standard protocol without IV contrast. Sagittal and coronal MPR images reconstructed from axial data set. COMPARISON:  02/03/2010 FINDINGS: Cardiovascular: Minimal atherosclerotic calcifications aorta and coronary arteries. 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 No pericardial effusion. Mediastinum/Nodes: Large hiatal hernia. No definite esophageal abnormalities. Base of cervical region normal appearance. No thoracic adenopathy. Lungs/Pleura: Severe emphysematous changes with bullous disease at the apices. Scattered peribronchial thickening. Parenchymal scarring RIGHT upper lobe. Patchy areas of ill-defined parenchymal opacity are seen within the LEFT lower lobe question pneumonia though requiring short-term interval follow-up to exclude pulmonary nodules. Remaining lungs clear. No additional infiltrate, pleural effusion or pneumothorax. Upper Abdomen: Dependent gallstones in gallbladder. Remaining visualized upper abdomen unremarkable. Musculoskeletal: Bones demineralized. IMPRESSION: Severe bullous emphysematous disease changes with scattered parenchymal scarring greatest in RIGHT upper  lobe. Multiple small irregular nodular appearing areas of opacification are seen within the LEFT lower lobe, potentially inflammatory but recommend short-term follow-up CT imaging in 4-6 weeks following treatment to ensure resolution and exclude underlying pulmonary mass/nodules. Cholelithiasis. Large hiatal hernia. 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 Recommend annual imaging followup by CTA or MRA. This recommendation follows 2010 ACCF/AHA/AATS/ACR/ASA/SCA/SCAI/SIR/STS/SVM Guidelines for the Diagnosis and Management of Patients with Thoracic Aortic Disease. Circulation. 2010; 121: Y706-C376 Aortic Atherosclerosis (ICD10-I70.0). 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 Emphysema (ICD10-J43.9). Electronically Signed   By: Lavonia Dana M.D.   On: 12/28/2017 15:23   Mr Brain Wo Contrast  Result Date: 12/28/2017 CLINICAL DATA:  Ataxia with stroke suspected EXAM: MRI HEAD WITHOUT CONTRAST TECHNIQUE: Multiplanar, multiecho pulse sequences of the brain and surrounding structures were obtained without intravenous contrast. COMPARISON:  Head CT from earlier today FINDINGS: Brain: No acute infarction, hemorrhage, hydrocephalus, extra-axial collection or mass lesion. Mild chronic small vessel ischemia in the cerebral white matter Vascular: Major flow voids are preserved Skull and upper cervical spine: No evidence of marrow lesion Sinuses/Orbits: Right mastoid opacification with negative nasopharynx. IMPRESSION: 1. No acute finding. 2. Unremarkable MRI of the brain for age. 3. Right mastoid opacification. Electronically Signed   By: Monte Fantasia M.D.   On: 12/28/2017 11:02    EKG:   Orders placed  or performed in visit on 06/11/07  . EKG 12-Lead      Management plans discussed with the patient, family and they are in agreement.  CODE STATUS:     Code Status Orders  (From admission, onward)         Start     Ordered   12/28/17 0851  Full code  Continuous      12/28/17 0850        Code Status History    This patient has a current code status but no historical code status.    Advance Directive Documentation     Most Recent Value  Type of Advance Directive  Healthcare Power of Attorney, Living will  Pre-existing out of facility DNR order (yellow form or pink MOST form)  -  "MOST" Form in Place?  -      TOTAL TIME TAKING CARE OF THIS PATIENT: 40 minutes.    Avel Peace  M.D on 12/29/2017 at 9:21 AM  Between 7am to 6pm - Pager - [phone]  After 6pm go to [email or web address] - password [email or web address] [facility]  Office  [phone]  CC: Primary care physician; Derinda Late, MD   Note: This dictation was prepared with Dragon dictation along with smaller phrase technology. Any transcriptional errors that result from this process are unintentional.

## 2017-12-29 NOTE — Progress Notes (Signed)
NIF -35

## 2017-12-31 ENCOUNTER — Telehealth: Payer: Self-pay

## 2017-12-31 NOTE — Telephone Encounter (Signed)
EMMI Follow-up: Kyle Holloway was on the Future Patient list and had just received a automated call so he called back.  I explained our process of the two automated calls post discharge and asked how he was doing. Michela Pitcher he was doing great.  He had gotten his Rx's filled and had an appointment today with neurologist at [facility]. No needs noted.

## 2018-01-02 ENCOUNTER — Other Ambulatory Visit: Payer: Self-pay

## 2018-01-02 ENCOUNTER — Emergency Department
Admission: EM | Admit: 2018-01-02 | Discharge: 2018-01-02 | Disposition: A | Discharge status: Home / Self Care | Payer: Medicare Other | Attending: Student in an Organized Health Care Education/Training Program | Admitting: Student in an Organized Health Care Education/Training Program

## 2018-01-02 ENCOUNTER — Encounter: Payer: Self-pay | Admitting: Emergency Medicine

## 2018-01-02 DIAGNOSIS — R531 Weakness: Secondary | ICD-10-CM | POA: Insufficient documentation

## 2018-01-02 DIAGNOSIS — Z79899 Other long term (current) drug therapy: Secondary | ICD-10-CM | POA: Diagnosis not present

## 2018-01-02 DIAGNOSIS — Z7982 Long term (current) use of aspirin: Secondary | ICD-10-CM | POA: Insufficient documentation

## 2018-01-02 DIAGNOSIS — Z87891 Personal history of nicotine dependence: Secondary | ICD-10-CM | POA: Insufficient documentation

## 2018-01-02 LAB — CBC
HCT: 45.5 % (ref 39.0–52.0)
HEMOGLOBIN: 15.4 g/dL (ref 13.0–17.0)
MCH: 31.8 pg (ref 26.0–34.0)
MCHC: 33.8 g/dL (ref 30.0–36.0)
MCV: 94 fL (ref 80.0–100.0)
Platelets: 328 10*3/uL (ref 150–400)
RBC: 4.84 MIL/uL (ref 4.22–5.81)
RDW: 13.2 % (ref 11.5–15.5)
WBC: 10.6 10*3/uL — AB (ref 4.0–10.5)
nRBC: 0 % (ref 0.0–0.2)

## 2018-01-02 LAB — GLUCOSE, CAPILLARY: Glucose-Capillary: 96 mg/dL (ref 70–99)

## 2018-01-02 LAB — BASIC METABOLIC PANEL
Anion gap: 8 (ref 5–15)
BUN: 13 mg/dL (ref 8–23)
CO2: 27 mmol/L (ref 22–32)
Calcium: 9.2 mg/dL (ref 8.9–10.3)
Chloride: 104 mmol/L (ref 98–111)
Creatinine, Ser: 0.79 mg/dL (ref 0.61–1.24)
GFR calc Af Amer: >60 mL/min (ref >60)
GFR calc non Af Amer: >60 mL/min (ref >60)
Glucose, Bld: 95 mg/dL (ref 70–99)
POTASSIUM: 3.4 mmol/L — AB (ref 3.5–5.1)
Sodium: 139 mmol/L (ref 135–145)

## 2018-01-02 NOTE — ED Notes (Addendum)
First Nurse Note: Patient states he woke at 0700 this AM and while eating breakfast at 0800 he noticed that he had facial drooping and difficulty swallowing which has since resolved.  Facial features symmetrical, speech clear.  No weakness of limbs detected.  Ambulatory without assistance. Patient smiles symmetrically, states the facial drooping involved both sides of his face.  States he was seen and admitted over the past weekend with similar symptoms.

## 2018-01-02 NOTE — ED Provider Notes (Signed)
[facility] Emergency Department Provider Note    First MD Initiated Contact with Patient 01/02/18 1031     (approximate)  I have reviewed the triage vital signs and the nursing notes.   HISTORY  Chief Complaint Weakness    HPI Kyle HUHN Sr. is a 77 y.o. male with a history of myasthenia gravis was recently admitted to the hospital for shortness of breath and weakness discharged on methotrexate as well as Mestinon taper presents the ER after having increased weakness with chewing and trouble swallowing this morning.  Had not taken his medications yet this morning.  Denies any pain or dysphasia.  No other associated weakness, tingling shortness of breath.  States he otherwise feels well but was told to return to the ER should he develop any recurrent symptoms.    Past Medical History:  Diagnosis Date  . Myasthenia gravis ([place])    Family History  Problem Relation Age of Onset  . Brain cancer Father    History reviewed. No pertinent surgical history. Patient Active Problem List   Diagnosis Date Noted  . Myasthenia ([place]) 12/28/2017  . 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 ACID REFLUX DISEASE 10/20/2007  . HIATAL HERNIA 10/20/2007  . CROHN'S DISEASE 10/20/2007  . DIVERTICULOSIS OF COLON 10/20/2007  . 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 Authorizing Provider  albuterol (PROVENTIL HFA;VENTOLIN HFA) 108 (90 Base) MCG/ACT inhaler Inhale 2 puffs into the lungs every 6 (six) hours as needed for wheezing or shortness of breath. 12/29/17   Salary, Holly Bodily D, MD  aspirin EC 81 MG tablet Take 81 mg by mouth daily.     [provider]  balsalazide (COLAZAL) 750 MG capsule Take 750 mg by mouth 3 (three) times daily. 09/24/17   [provider]  Biotin 1 MG CAPS Take by mouth.    [provider]  finasteride (PROSCAR) 5 MG tablet Take 5 mg by  mouth daily. 12/12/17   [provider]  folic acid (FOLVITE) 1 MG tablet Take 1 mg by mouth daily.  07/06/14   [provider]  methotrexate (RHEUMATREX) 2.5 MG tablet Take 2 tablets (5 mg total) by mouth once a week. Every Wednesday  Caution:Chemotherapy. Protect from light. 12/29/17   Salary, Avel Peace, MD  omeprazole (PRILOSEC) 40 MG capsule Take 40 mg by mouth daily. 12/13/17   [provider]  predniSONE (STERAPRED UNI-PAK 21 TAB) 5 MG (21) TBPK tablet Take as directed 12/29/17   Salary, Avel Peace, MD  pyridostigmine (MESTINON) 60 MG tablet Take 0.5 tablets (30 mg total) by mouth every 8 (eight) hours. 12/29/17   Salary, Holly Bodily D, MD  tamsulosin (FLOMAX) 0.4 MG CAPS capsule TAKE 1 CAPSULE BY MOUTH ONCE DAILY TAKE 30 MINUTES AFTER THE SAME MEAL EACH DAY. 12/12/17   [provider]  vitamin B-12 (CYANOCOBALAMIN) 500 MCG tablet Take 500 mcg by mouth daily.     [provider]    Allergies Patient has no known allergies.    Social History Social History   Tobacco Use  . Smoking status: Former Research scientist (life sciences)  . Smokeless tobacco: Never Used  Substance Use Topics  . Alcohol use: Not Currently  . Drug use: Not on file    Review of Systems Patient denies headaches, rhinorrhea, blurry vision, numbness, shortness of breath, chest pain, edema, cough, abdominal pain, nausea, vomiting, diarrhea, dysuria, fevers, rashes or hallucinations  unless otherwise stated above in HPI. ____________________________________________   PHYSICAL EXAM:  VITAL SIGNS: Vitals:   01/02/18 1030 01/02/18 1100  BP: (!) 145/92 (!) 144/95  Pulse: 69 66  Resp:    Temp:    SpO2: 94% 94%    Constitutional: Alert and oriented.  Eyes: Conjunctivae are normal.  Head: Atraumatic. Nose: No congestion/rhinnorhea. Mouth/Throat: Mucous membranes are moist.   Neck: No stridor. Painless ROM.  Cardiovascular: Normal rate, regular rhythm. Grossly normal heart sounds.  Good  peripheral circulation. Respiratory: Normal respiratory effort.  No retractions. Lungs CTAB. Gastrointestinal: Soft and nontender. No distention. No abdominal bruits. No CVA tenderness. Genitourinary:  Musculoskeletal: No lower extremity tenderness nor edema.  No joint effusions. Neurologic:  Normal speech and language. No gross focal neurologic deficits are appreciated. No facial droop Skin:  Skin is warm, dry and intact. No rash noted. Psychiatric: Mood and affect are normal. Speech and behavior are normal.  ____________________________________________   LABS (all labs ordered are listed, but only abnormal results are displayed)  Results for orders placed or performed during the hospital encounter of 01/02/18 (from the past 24 hour(s))  Glucose, capillary     Status: None   Collection Time: 01/02/18  9:58 AM  Result Value Ref Range   Glucose-Capillary 96 70 - 99 mg/dL  Basic metabolic panel     Status: Abnormal   Collection Time: 01/02/18 10:11 AM  Result Value Ref Range   Sodium 139 135 - 145 mmol/L   Potassium 3.4 (L) 3.5 - 5.1 mmol/L   Chloride 104 98 - 111 mmol/L   CO2 27 22 - 32 mmol/L   Glucose, Bld 95 70 - 99 mg/dL   BUN 13 8 - 23 mg/dL   Creatinine, Ser 0.79 0.61 - 1.24 mg/dL   Calcium 9.2 8.9 - 10.3 mg/dL   GFR calc non Af Amer >60 >60 mL/min   GFR calc Af Amer >60 >60 mL/min   Anion gap 8 5 - 15  CBC     Status: Abnormal   Collection Time: 01/02/18 10:11 AM  Result Value Ref Range   WBC 10.6 (H) 4.0 - 10.5 K/uL   RBC 4.84 4.22 - 5.81 MIL/uL   Hemoglobin 15.4 13.0 - 17.0 g/dL   HCT 45.5 39.0 - 52.0 %   MCV 94.0 80.0 - 100.0 fL   MCH 31.8 26.0 - 34.0 pg   MCHC 33.8 30.0 - 36.0 g/dL   RDW 13.2 11.5 - 15.5 %   Platelets 328 150 - 400 K/uL   nRBC 0.0 0.0 - 0.2 %   ____________________________________________ ____________________________________________  ED ECG REPORT I, Merlyn Lot, the attending physician, personally viewed and interpreted this ECG.    Date: 01/02/2018  EKG Time: 9:54  Rate: 70  Rhythm: normal EKG, normal sinus rhythm, unchanged from previous tracings  Axis: normal  Intervals:  normal  ST&T Change: no stemi    ____________________________________________   PROCEDURES  Procedure(s) performed:  Procedures    Critical Care performed: no ____________________________________________   INITIAL IMPRESSION / ASSESSMENT AND PLAN / ED COURSE  Pertinent labs & imaging results that were available during my care of the patient were reviewed by me and considered in my medical decision making (see chart for details).   DDX: Myasthenic crisis, dehydration, CVA  JONOTHAN HEBERLE Sr. is a 77 y.o. who presents to the ED with symptoms as described above.  Patient well-appearing.  No respiratory symptoms.  Blood work sent out of triage shows no acute  abnormality.  I consulted Dr. Irish Elders of neurology who agrees there is no indication for additional neuroimaging or emergent testing.  Patient stable and appropriate for outpatient follow-up.      As part of my medical decision making, I reviewed the following data within the [facility] notes reviewed and incorporated, Labs reviewed, notes from prior ED visits.   ____________________________________________   FINAL CLINICAL IMPRESSION(S) / ED DIAGNOSES  Final diagnoses:  Weakness      NEW MEDICATIONS STARTED DURING THIS VISIT:  New Prescriptions   No medications on file     Note:  This document was prepared using Dragon voice recognition software and may include unintentional dictation errors.    Merlyn Lot, MD 01/02/18 [phone]

## 2018-01-02 NOTE — Discharge Instructions (Signed)
Follow up with Dr. Manuella Ghazi.  Return for any shortness of breath.

## 2018-01-02 NOTE — ED Triage Notes (Signed)
Pt reports that he was here Saturday for trouble breathing and weakness and was admitted. He was discharged on Sunday. He reports that he was eating cheese toast and had trouble chewing and swallowing it around 0800 this am.

## 2018-01-02 NOTE — ED Notes (Signed)
Pt presents with difficulty swallowing and chewing this morning, as well as "heaviness" and "drooping" in his face (bilaterally). Pt admitted recently with difficulty breathing. Pt has hx of myasthenia gravis. He was told to come back with any different or concerning symptoms, so he came back.

## 2018-01-06 NOTE — Progress Notes (Signed)
[facility] Consultation      Assessment and Plan:  COPD/emphysema. - Despite severe anatomical emphysema, the patient is not symptomatic, likely because he is not smoking and has a high level of physical activity.  Recommended that he continue physical activity. - No need for inhaler at this time.  He does have some chronic bronchitic symptoms with a.m. cough which appear mild.  Excessive daytime sleepiness. - Symptoms and signs of obstructive sleep apnea, which may be affected by myasthenia gravis. - We will send for sleep study and sleep lab.  Myasthenia gravis. - May be contributing to sleep apnea, will monitor.  Orders Placed This Encounter  Procedures  . Alpha-1-antitrypsin  . Alpha-1 antitrypsin phenotype  . Pulmonary Function Test [facility] Only  . Split night study   Return in about 3 months (around 04/08/2018).   Date: 01/07/2018  MRN# [id number] Kyle FLUEGEL Sr. [birth year]    Kyle Gully Sr. is a 77 y.o. old male seen in consultation for chief complaint of: dyspnea, referred by Dr. Jerelyn Charles.    Chief Complaint  Patient presents with  . Hospitalization Follow-up    Pt states he is feeling much better; had been in hospital due to SOB.     HPI:   The patient is a 77 year old male who was admitted to the hospital for 1 day on 12/28/2017 with acute myasthenia gravis, including ocular myasthenia gravis. He had previously been on mtx but it had been stopped around 2016.  He was discharged with methotrexate, Mestinon taper.  When he was admitted he also had some trouble breathing which he attributed to the MG. Since being on treatment the breathing is significantly better.   He is fairly active, he built rock wall this summer and did his tile floor on his patio, he is active in maintaining his yard.  As he has been active he has never been limited by breathing.  He used to smoke about a ppd, last about 25 years ago. He smoked for about 35 years. No  one in his family has COPD as far as he knows.  He does have a morning cough had has to clear out mucus.   Wife notes that he snores at night, he occasionally stops breathing his in his sleep and she has to try to see if he is "still there" He has never had a sleep study. He tends to fall asleep when sitting idle.    CT chest 12/28/2017>> images personally reviewed, changes of severe bullous emphysema, worse in the upper zones.  Mild bronchiectatic changes.  There is a large hiatal hernia with intrathoracic stomach.   PMHX:   Past Medical History:  Diagnosis Date  . Myasthenia gravis ([place])    Surgical Hx:  History reviewed. No pertinent surgical history. Family Hx:  Family History  Problem Relation Age of Onset  . Brain cancer Father    Social Hx:   Social History   Tobacco Use  . Smoking status: Former Research scientist (life sciences)  . Smokeless tobacco: Never Used  Substance Use Topics  . Alcohol use: Not Currently  . Drug use: Not on file   Medication:    Current Outpatient Medications:  .  albuterol (PROVENTIL HFA;VENTOLIN HFA) 108 (90 Base) MCG/ACT inhaler, Inhale 2 puffs into the lungs every 6 (six) hours as needed for wheezing or shortness of breath., Disp: 1 Inhaler, Rfl: 0 .  aspirin EC 81 MG tablet, Take 81 mg by mouth daily. , Disp: , Rfl:  .  balsalazide (COLAZAL) 750 MG capsule, Take 750 mg by mouth 3 (three) times daily., Disp: , Rfl: 3 .  Biotin 1 MG CAPS, Take by mouth., Disp: , Rfl:  .  finasteride (PROSCAR) 5 MG tablet, Take 5 mg by mouth daily., Disp: , Rfl: 3 .  folic acid (FOLVITE) 1 MG tablet, Take 1 mg by mouth daily. , Disp: , Rfl:  .  methotrexate (RHEUMATREX) 2.5 MG tablet, Take 2 tablets (5 mg total) by mouth once a week. Every Wednesday  Caution:Chemotherapy. Protect from light., Disp: 4 tablet, Rfl: 0 .  omeprazole (PRILOSEC) 40 MG capsule, Take 40 mg by mouth daily., Disp: , Rfl: 3 .  pyridostigmine (MESTINON) 60 MG tablet, Take 0.5 tablets (30 mg total) by mouth every  8 (eight) hours., Disp: 90 tablet, Rfl: 0 .  tamsulosin (FLOMAX) 0.4 MG CAPS capsule, TAKE 1 CAPSULE BY MOUTH ONCE DAILY TAKE 30 MINUTES AFTER THE SAME MEAL EACH DAY., Disp: , Rfl: 3 .  vitamin B-12 (CYANOCOBALAMIN) 500 MCG tablet, Take 500 mcg by mouth daily. , Disp: , Rfl:    Allergies:  Patient has no known allergies.  Review of Systems: Gen:  Denies  fever, sweats, chills HEENT: Denies blurred vision, double vision. bleeds, sore throat Cvc:  No dizziness, chest pain. Resp:   Denies cough or sputum production, shortness of breath Gi: Denies swallowing difficulty, stomach pain. Gu:  Denies bladder incontinence, burning urine Ext:   No Joint pain, stiffness. Skin: No skin rash,  hives  Endoc:  No polyuria, polydipsia. Psych: No depression, insomnia. Other:  All other systems were reviewed with the patient and were negative other that what is mentioned in the HPI.   Physical Examination:   VS: BP 110/76 (BP Location: Left Arm, Cuff Size: Normal)   Pulse 73   Ht 5' 10"  (1.778 m)   Wt 193 lb (87.5 kg)   SpO2 98%   BMI 27.69 kg/m   General Appearance: No distress  Neuro:without focal findings,  speech normal,  HEENT: PERRLA, EOM intact.   Pulmonary: normal breath sounds, No wheezing.  CardiovascularNormal S1,S2.  No m/r/g.   Abdomen: Benign, Soft, non-tender. Renal:  No costovertebral tenderness  GU:  No performed at this time. Endoc: No evident thyromegaly, no signs of acromegaly. Skin:   warm, no rashes, no ecchymosis  Extremities: normal, no cyanosis, clubbing.  Other findings:    LABORATORY PANEL:   CBC Recent Labs  Lab 01/02/18 1011  WBC 10.6*  HGB 15.4  HCT 45.5  PLT 328   ------------------------------------------------------------------------------------------------------------------  Chemistries  Recent Labs  Lab 01/02/18 1011  NA 139  K 3.4*  CL 104  CO2 27  GLUCOSE 95  BUN 13  CREATININE 0.79  CALCIUM 9.2    ------------------------------------------------------------------------------------------------------------------  Cardiac Enzymes No results for input(s): TROPONINI in the last 168 hours. ------------------------------------------------------------  RADIOLOGY:  No results found.     Thank  you for the consultation and for allowing [facility] to assist in the care of your patient. Our recommendations are noted above.  Please contact us if we can be of further service.   Marda Stalker, M.D., F.C.C.P.  Board Certified in Internal Medicine, Pulmonary Medicine, [place], and Sleep Medicine.  [facility] Office Number: [phone]   01/07/2018

## 2018-01-07 ENCOUNTER — Encounter: Payer: Self-pay | Admitting: Internal Medicine

## 2018-01-07 ENCOUNTER — Ambulatory Visit (INDEPENDENT_AMBULATORY_CARE_PROVIDER_SITE_OTHER): Payer: Medicare Other | Admitting: Internal Medicine

## 2018-01-07 ENCOUNTER — Other Ambulatory Visit
Admission: RE | Admit: 2018-01-07 | Discharge: 2018-01-07 | Disposition: A | Discharge status: Home / Self Care | Payer: Medicare Other | Source: Ambulatory Visit | Attending: Internal Medicine | Admitting: Internal Medicine

## 2018-01-07 VITALS — BP 110/76 | HR 73 | Ht 70.0 in | Wt 193.0 lb

## 2018-01-07 DIAGNOSIS — J449 Chronic obstructive pulmonary disease, unspecified: Secondary | ICD-10-CM

## 2018-01-07 DIAGNOSIS — G4719 Other hypersomnia: Secondary | ICD-10-CM | POA: Diagnosis not present

## 2018-01-07 DIAGNOSIS — K5010 Crohn's disease of large intestine without complications: Secondary | ICD-10-CM | POA: Insufficient documentation

## 2018-01-07 DIAGNOSIS — E785 Hyperlipidemia, unspecified: Secondary | ICD-10-CM | POA: Insufficient documentation

## 2018-01-07 DIAGNOSIS — I10 Essential (primary) hypertension: Secondary | ICD-10-CM | POA: Insufficient documentation

## 2018-01-07 NOTE — Addendum Note (Signed)
Addended by: Santiago Bur on: 01/07/2018 10:39 AM   Modules accepted: Orders

## 2018-01-07 NOTE — Patient Instructions (Signed)
Will send for sleep study.   Will send for blood work and lung function test.

## 2018-01-08 LAB — ALPHA-1 ANTITRYPSIN PHENOTYPE: A-1 Antitrypsin, Ser: 144 mg/dL (ref 101–187)

## 2018-01-10 ENCOUNTER — Telehealth: Payer: Self-pay | Admitting: Internal Medicine

## 2018-01-10 NOTE — Telephone Encounter (Signed)
LMOVM for pt to return my call about canceling sleep study. Rhonda J Cobb

## 2018-01-10 NOTE — Telephone Encounter (Signed)
Pt would like to cancel his sleep study.

## 2018-01-13 DIAGNOSIS — J449 Chronic obstructive pulmonary disease, unspecified: Secondary | ICD-10-CM | POA: Diagnosis present

## 2018-01-14 NOTE — Telephone Encounter (Signed)
Pt never returned call.  I contacted Sharyn Lull at [facility] and she stated that patient hasn't been contacted yet, waiting on Insurance approval which was received yesterday.    I informed Sharyn Lull per this phone message that patient wanted to cancel sleep study.  Michelle with [facility] voided order out.  Pt will not be contacted to schedule study per pt request. Kyle Holloway

## 2018-04-01 ENCOUNTER — Ambulatory Visit: Payer: Medicare Other | Attending: Internal Medicine

## 2018-04-01 DIAGNOSIS — J449 Chronic obstructive pulmonary disease, unspecified: Secondary | ICD-10-CM | POA: Insufficient documentation

## 2018-04-01 DIAGNOSIS — G4719 Other hypersomnia: Secondary | ICD-10-CM

## 2018-04-01 MED ORDER — ALBUTEROL SULFATE (2.5 MG/3ML) 0.083% IN NEBU
2.5000 mg | INHALATION_SOLUTION | Freq: Once | RESPIRATORY_TRACT | Status: AC
  Filled 2018-04-01: qty 3

## 2018-04-21 NOTE — Progress Notes (Signed)
[facility] Consultation      Assessment and Plan:  COPD/emphysema. - Changes of severe anatomical emphysema seen on imaging, though the patient has only mild obstruction seen on PFT.  The patient has minimal to no symptoms, he remains very active.  Recommended that he continue physical activity. - No need for inhaler at this time.  He does have some chronic bronchitic symptoms with a.m. cough which appear mild.  Excessive daytime sleepiness. - Symptoms and signs of obstructive sleep apnea, which may be affected by myasthenia gravis. - Scheduled sleep study had been scheduled but pt declined.   Myasthenia gravis. - Appears stable.   Return if symptoms worsen..   Date: 04/21/2018  MRN# [id number] Kyle GONGORA Sr. 29-May-1940    Kyle Gully Sr. is a 78 y.o. old male seen in consultation for chief complaint of: dyspnea, referred by Dr. Jerelyn Charles.    Chief Complaint  Patient presents with  . Follow-up    pt reports of prod cough with clear mucus mainly in the morning & occ wheezing    HPI:  The patient is a 78 year old male with a history of myasthenia gravis, ocular myasthenia gravis, emphysema.  His COPD was not symptomatic, and he did not require inhalers.  At last visit he was noted to have excessive daytime sleepiness, he was scheduled for sleep study but declined because his sister is a Marine scientist and recommend against it.  He feels that his breathing has been doing well. He is not limited by dyspnea. He is not on an inhaler he is very active and is not limited by breathing issues.  He has smoked in the past, last about 25 yrs ago.    **PFT 04/30/2018>> tracings personally reviewed, FVC is 5.39 L, 136% predicted.  FEV1 is 3.29 L, 111% predicted.  There is no significant improvement with bronchodilator.  Ratio is 61%.  TLC is 115% predicted, ratio is 82%.   DLCO 77% predicted.  Flow volume loop is mildly obstructed.  Overall this shows mild obstruction with  normal FEV1.  This is consistent with mild obstructive lung disease or normal physiologic variant. **CT chest 12/28/2017>> images personally reviewed, changes of severe bullous emphysema, worse in the upper zones.  Mild bronchiectatic changes.  There is a large hiatal hernia with intrathoracic stomach.   PMHX:   Past Medical History:  Diagnosis Date  . Myasthenia gravis ([place])    Medication:    Current Outpatient Medications:  .  albuterol (PROVENTIL HFA;VENTOLIN HFA) 108 (90 Base) MCG/ACT inhaler, Inhale 2 puffs into the lungs every 6 (six) hours as needed for wheezing or shortness of breath., Disp: 1 Inhaler, Rfl: 0 .  aspirin EC 81 MG tablet, Take 81 mg by mouth daily. , Disp: , Rfl:  .  balsalazide (COLAZAL) 750 MG capsule, Take 750 mg by mouth 3 (three) times daily., Disp: , Rfl: 3 .  Biotin 1 MG CAPS, Take by mouth., Disp: , Rfl:  .  finasteride (PROSCAR) 5 MG tablet, Take 5 mg by mouth daily., Disp: , Rfl: 3 .  folic acid (FOLVITE) 1 MG tablet, Take 1 mg by mouth daily. , Disp: , Rfl:  .  methotrexate (RHEUMATREX) 2.5 MG tablet, Take 2 tablets (5 mg total) by mouth once a week. Every Wednesday  Caution:Chemotherapy. Protect from light., Disp: 4 tablet, Rfl: 0 .  omeprazole (PRILOSEC) 40 MG capsule, Take 40 mg by mouth daily., Disp: , Rfl: 3 .  pyridostigmine (MESTINON) 60 MG tablet,  Take 0.5 tablets (30 mg total) by mouth every 8 (eight) hours., Disp: 90 tablet, Rfl: 0 .  tamsulosin (FLOMAX) 0.4 MG CAPS capsule, TAKE 1 CAPSULE BY MOUTH ONCE DAILY TAKE 30 MINUTES AFTER THE SAME MEAL EACH DAY., Disp: , Rfl: 3 .  vitamin B-12 (CYANOCOBALAMIN) 500 MCG tablet, Take 500 mcg by mouth daily. , Disp: , Rfl:  No current facility-administered medications for this visit.   Facility-Administered Medications Ordered in Other Visits:  .  albuterol (PROVENTIL) (2.5 MG/3ML) 0.083% nebulizer solution 2.5 mg, 2.5 mg, Nebulization, Once, Laverle Hobby, MD   Review of Systems:  Constitutional:  Feels well. Cardiovascular: Denies chest pain, exertional chest pain.  Pulmonary: Denies hemoptysis, pleuritic chest pain.   The remainder of systems were reviewed and were found to be negative other than what is documented in the HPI.    Physical Examination:   VS: BP 126/78 (BP Location: Left Arm, Cuff Size: Normal)   Pulse 60   Ht 5' 10"  (1.778 m)   Wt 195 lb (88.5 kg)   SpO2 93%   BMI 27.98 kg/m   General Appearance: No distress  Neuro:without focal findings, mental status, speech normal, alert and oriented HEENT: PERRLA, EOM intact Pulmonary: No wheezing, No rales  CardiovascularNormal S1,S2.  No m/r/g.  Abdomen: Benign, Soft, non-tender, No masses Renal:  No costovertebral tenderness  GU:  No performed at this time. Endoc: No evident thyromegaly, no signs of acromegaly or Cushing features Skin:   warm, no rashes, no ecchymosis  Extremities: normal, no cyanosis, clubbing.      LABORATORY PANEL:   CBC No results for input(s): WBC, HGB, HCT, PLT in the last 168 hours. ------------------------------------------------------------------------------------------------------------------  Chemistries  No results for input(s): NA, K, CL, CO2, GLUCOSE, BUN, CREATININE, CALCIUM, MG, AST, ALT, ALKPHOS, BILITOT in the last 168 hours.  Invalid input(s): GFRCGP ------------------------------------------------------------------------------------------------------------------  Cardiac Enzymes No results for input(s): TROPONINI in the last 168 hours. ------------------------------------------------------------  RADIOLOGY:  No results found.     Thank  you for the consultation and for allowing [facility] to assist in the care of your patient. Our recommendations are noted above.  Please contact us if we can be of further service.   Marda Stalker, M.D., F.C.C.P.  Board Certified in Internal Medicine, Pulmonary Medicine, [place],  and Sleep Medicine.  [facility] Office Number: [phone]   04/21/2018

## 2018-04-22 ENCOUNTER — Ambulatory Visit (INDEPENDENT_AMBULATORY_CARE_PROVIDER_SITE_OTHER): Payer: Medicare Other | Admitting: Internal Medicine

## 2018-04-22 ENCOUNTER — Other Ambulatory Visit: Payer: Self-pay

## 2018-04-22 ENCOUNTER — Encounter: Payer: Self-pay | Admitting: Internal Medicine

## 2018-04-22 VITALS — BP 126/78 | HR 60 | Ht 70.0 in | Wt 195.0 lb

## 2018-04-22 DIAGNOSIS — J449 Chronic obstructive pulmonary disease, unspecified: Secondary | ICD-10-CM

## 2018-04-22 NOTE — Patient Instructions (Signed)
The best thing that you can do for your breathing is to remain active.

## 2020-06-17 ENCOUNTER — Emergency Department: Payer: Medicare Other

## 2020-06-17 ENCOUNTER — Emergency Department
Admission: EM | Admit: 2020-06-17 | Discharge: 2020-06-17 | Disposition: A | Discharge status: Home / Self Care | Payer: Medicare Other | Attending: Emergency Medicine | Admitting: Emergency Medicine

## 2020-06-17 ENCOUNTER — Other Ambulatory Visit: Payer: Self-pay

## 2020-06-17 ENCOUNTER — Encounter: Payer: Self-pay | Admitting: Intensive Care

## 2020-06-17 DIAGNOSIS — R109 Unspecified abdominal pain: Secondary | ICD-10-CM | POA: Diagnosis not present

## 2020-06-17 DIAGNOSIS — I10 Essential (primary) hypertension: Secondary | ICD-10-CM | POA: Insufficient documentation

## 2020-06-17 DIAGNOSIS — Z87891 Personal history of nicotine dependence: Secondary | ICD-10-CM | POA: Diagnosis not present

## 2020-06-17 DIAGNOSIS — Z7982 Long term (current) use of aspirin: Secondary | ICD-10-CM | POA: Insufficient documentation

## 2020-06-17 HISTORY — DX: Crohn's disease, unspecified, without complications: K50.90

## 2020-06-17 LAB — COMPREHENSIVE METABOLIC PANEL
ALT: 28 U/L (ref 0–44)
AST: 36 U/L (ref 15–41)
Albumin: 4.6 g/dL (ref 3.5–5.0)
Alkaline Phosphatase: 57 U/L (ref 38–126)
Anion gap: 11 (ref 5–15)
BUN: 13 mg/dL (ref 8–23)
CO2: 25 mmol/L (ref 22–32)
Calcium: 9.3 mg/dL (ref 8.9–10.3)
Chloride: 102 mmol/L (ref 98–111)
Creatinine, Ser: 1.06 mg/dL (ref 0.61–1.24)
GFR, Estimated: >60 mL/min (ref >60)
Glucose, Bld: 89 mg/dL (ref 70–99)
Potassium: 4.2 mmol/L (ref 3.5–5.1)
Sodium: 138 mmol/L (ref 135–145)
Total Bilirubin: 1.3 mg/dL — ABNORMAL HIGH (ref 0.3–1.2)
Total Protein: 8.5 g/dL — ABNORMAL HIGH (ref 6.5–8.1)

## 2020-06-17 LAB — CBC WITH DIFFERENTIAL/PLATELET
Abs Immature Granulocytes: 0.03 10*3/uL (ref 0.00–0.07)
Basophils Absolute: 0.1 10*3/uL (ref 0.0–0.1)
Basophils Relative: 1 %
Eosinophils Absolute: 0.1 10*3/uL (ref 0.0–0.5)
Eosinophils Relative: 1 %
HCT: 44.7 % (ref 39.0–52.0)
Hemoglobin: 15.6 g/dL (ref 13.0–17.0)
Immature Granulocytes: 1 %
Lymphocytes Relative: 18 %
Lymphs Abs: 1 10*3/uL (ref 0.7–4.0)
MCH: 33.5 pg (ref 26.0–34.0)
MCHC: 34.9 g/dL (ref 30.0–36.0)
MCV: 96.1 fL (ref 80.0–100.0)
Monocytes Absolute: 0.5 10*3/uL (ref 0.1–1.0)
Monocytes Relative: 8 %
Neutro Abs: 4.1 10*3/uL (ref 1.7–7.7)
Neutrophils Relative %: 71 %
Platelets: 302 10*3/uL (ref 150–400)
RBC: 4.65 MIL/uL (ref 4.22–5.81)
RDW: 14.2 % (ref 11.5–15.5)
WBC: 5.7 10*3/uL (ref 4.0–10.5)
nRBC: 0 % (ref 0.0–0.2)

## 2020-06-17 MED ORDER — NAPROXEN 375 MG PO TABS: 375.0000 mg | ORAL_TABLET | Freq: Two times a day (BID) | ORAL | 0 refills | Status: AC

## 2020-06-17 NOTE — ED Provider Notes (Signed)
[facility] Emergency Department Provider Note  ____________________________________________   Event Date/Time   First MD Initiated Contact with Patient 06/17/20 [phone]     (approximate)  I have reviewed the triage vital signs and the nursing notes.   HISTORY  Chief Complaint Back Pain and Fall    HPI Kyle BEG Sr. is a 80 y.o. male here with right flank pain.  The patient states he fell approximately 4 weeks ago.  He was working on a ladder and fell approximately 4 steps.  He landed on his feet, but did hit his right side on a cement flowerpot.  Shortly thereafter, the patient developed mild pain on the right side that resolved over the next day or so.  He has not had any significant pain since then.  This morning, the patient was turning in bed when he experienced acute onset of severe, sharp, stabbing, right flank pain.  This was worse with certain position changes.  He tried to roll over again and was unable to do so due to the sharp, stabbing pain.  He went to the other room after trying to get up slowly, and his symptoms gradually abated.  He had no associated shortness of breath or cough.  No urinary symptoms.  No hematuria.  No history of kidney stones.  Denies any other complaints.  No fevers.  No chills.  No other trauma.        Past Medical History:  Diagnosis Date  . Crohn's disease ([place])   . Myasthenia gravis [facility])     Patient Active Problem List   Diagnosis Date Noted  . Benign essential hypertension 01/07/2018  . Crohn's colitis ([place]) 01/07/2018  . Hyperlipidemia LDL goal <130 01/07/2018  . Myasthenia ([place]) 12/28/2017  . Benign non-nodular prostatic hyperplasia with lower urinary tract symptoms 12/28/2013  . 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 ACID REFLUX DISEASE 10/20/2007  . HIATAL HERNIA 10/20/2007  . CROHN'S DISEASE 10/20/2007  . DIVERTICULOSIS OF COLON 10/20/2007  . 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 No pertinent surgical history.  Prior to Admission medications   Medication Sig Start Date End Date Taking? Authorizing Provider  naproxen (NAPROSYN) 375 MG tablet Take 1 tablet (375 mg total) by mouth 2 (two) times daily with a meal for 5 days. 06/17/20 06/22/20 Yes Duffy Bruce, MD  albuterol (PROVENTIL HFA;VENTOLIN HFA) 108 (90 Base) MCG/ACT inhaler Inhale 2 puffs into the lungs every 6 (six) hours as needed for wheezing or shortness of breath. 12/29/17   Salary, Holly Bodily D, MD  aspirin EC 81 MG tablet Take 81 mg by mouth daily.     [provider]  balsalazide (COLAZAL) 750 MG capsule Take 750 mg by mouth 3 (three) times daily. 09/24/17   [provider]  Biotin 1 MG CAPS Take by mouth.    [provider]  finasteride (PROSCAR) 5 MG tablet Take 5 mg by mouth daily. 12/12/17   [provider]  folic acid (FOLVITE) 1 MG tablet Take 1 mg by mouth daily.  07/06/14   [provider]  methotrexate (RHEUMATREX) 2.5 MG tablet Take 2 tablets (5 mg total) by mouth once a week. Every Wednesday  Caution:Chemotherapy. Protect from light. 12/29/17   Salary, Avel Peace, MD  omeprazole (PRILOSEC) 40 MG capsule Take 40 mg by mouth daily. 12/13/17   [provider]  pyridostigmine (MESTINON) 60 MG tablet Take 0.5 tablets (30 mg total) by mouth every 8 (eight) hours.  12/29/17   Salary, Holly Bodily D, MD  tamsulosin (FLOMAX) 0.4 MG CAPS capsule TAKE 1 CAPSULE BY MOUTH ONCE DAILY TAKE 30 MINUTES AFTER THE SAME MEAL EACH DAY. 12/12/17   [provider]  vitamin B-12 (CYANOCOBALAMIN) 500 MCG tablet Take 500 mcg by mouth daily.     [provider]    Allergies Patient has no known allergies.  Family History  Problem Relation Age of Onset  . Brain cancer Father     Social History Social History   Tobacco Use  . Smoking status: Former Smoker    Packs/day: 1.50    Years: 95.00    Pack years: 142.50    Types: Cigarettes     Quit date: 1995    Years since quitting: 27.3  . Smokeless tobacco: Never Used  Substance Use Topics  . Alcohol use: Not Currently  . Drug use: Never    Review of Systems  Review of Systems  Constitutional: Negative for chills, fatigue and fever.  HENT: Negative for sore throat.   Respiratory: Negative for shortness of breath.   Cardiovascular: Negative for chest pain.  Gastrointestinal: Negative for abdominal pain.  Genitourinary: Positive for flank pain.  Musculoskeletal: Negative for neck pain.  Skin: Negative for rash and wound.  Allergic/Immunologic: Negative for immunocompromised state.  Neurological: Negative for weakness and numbness.  Hematological: Does not bruise/bleed easily.  All other systems reviewed and are negative.    ____________________________________________  PHYSICAL EXAM:      VITAL SIGNS: ED Triage Vitals [06/17/20 0942]  Enc Vitals Group     BP (!) 148/92     Pulse Rate 81     Resp 16     Temp 98 F (36.7 C)     Temp Source Oral     SpO2 98 %     Weight 200 lb (90.7 kg)     Height 5' 9"  (1.753 m)     Head Circumference      Peak Flow      Pain Score 1     Pain Loc      Pain Edu?      Excl. in [place]?      Physical Exam Vitals and nursing note reviewed.  Constitutional:      General: He is not in acute distress.    Appearance: He is well-developed.  HENT:     Head: Normocephalic and atraumatic.  Eyes:     Conjunctiva/sclera: Conjunctivae normal.  Cardiovascular:     Rate and Rhythm: Normal rate and regular rhythm.     Heart sounds: Normal heart sounds. No murmur heard. No friction rub.  Pulmonary:     Effort: Pulmonary effort is normal. No respiratory distress.     Breath sounds: Normal breath sounds. No wheezing or rales.  Abdominal:     General: There is no distension.     Palpations: Abdomen is soft.     Tenderness: There is no abdominal tenderness.  Musculoskeletal:     Cervical back: Neck supple.     Comments: Mild  TTP over R posterolateral chest wall. No bruising or deformity. No skin rashes or lesions.  Skin:    General: Skin is warm.     Capillary Refill: Capillary refill takes less than 2 seconds.  Neurological:     Mental Status: He is alert and oriented to person, place, and time.     Motor: No abnormal muscle tone.       ____________________________________________   LABS (all labs  ordered are listed, but only abnormal results are displayed)  Labs Reviewed  COMPREHENSIVE METABOLIC PANEL - Abnormal; Notable for the following components:      Result Value   Total Protein 8.5 (*)    Total Bilirubin 1.3 (*)    All other components within normal limits  CBC WITH DIFFERENTIAL/PLATELET  URINALYSIS, COMPLETE (UACMP) WITH MICROSCOPIC    ____________________________________________  _____________________________  RADIOLOGY All imaging, including plain films, CT scans, and ultrasounds, independently reviewed by me, and interpretations confirmed via formal radiology reads.  ED MD interpretation:   CT stone: Cholelithiasis without signs of acute cholecystitis, hiatal hernia Chest x-ray: Clear  Official radiology report(s): DG Chest 2 View  Result Date: 06/17/2020 CLINICAL DATA:  Right flank pain EXAM: CHEST - 2 VIEW COMPARISON:  12/28/2017 FINDINGS: Large hiatal hernia. Heart is normal size. No confluent opacities or effusions. No acute bony abnormality. IMPRESSION: Large hiatal hernia. No active disease. Electronically Signed   By: Rolm Baptise M.D.   On: 06/17/2020 10:26   CT Renal Stone Study  Result Date: 06/17/2020 CLINICAL DATA:  Right flank pain, fall EXAM: CT ABDOMEN AND PELVIS WITHOUT CONTRAST TECHNIQUE: Multidetector CT imaging of the abdomen and pelvis was performed following the standard protocol without IV contrast. COMPARISON:  None. FINDINGS: Lower chest: Large hiatal hernia.  No acute abnormality. Hepatobiliary: Layering small gallstones in the gallbladder. No biliary  ductal dilatation. No focal hepatic abnormality. No evidence of a hepatic injury or perihepatic hematoma. Pancreas: No focal abnormality or ductal dilatation. Spleen: No focal abnormality.  Normal size. Adrenals/Urinary Tract: No renal or ureteral stones. No hydronephrosis. Small cyst off the lower pole of the right kidney measures up to 19 mm. Adrenal glands and urinary bladder unremarkable. Stomach/Bowel: Diffuse colonic diverticulosis. No active diverticulitis. Stomach and small bowel decompressed, unremarkable. Appendix not visualized. No inflammatory process in the right lower quadrant. Vascular/Lymphatic: Aortic atherosclerosis. No evidence of aneurysm or adenopathy. Reproductive: No visible focal abnormality. Other: No free fluid or free air. Musculoskeletal: No acute bony abnormality. IMPRESSION: No evidence of solid organ injury. Cholelithiasis. Diffuse colonic diverticulosis. Large hiatal hernia. Aortic atherosclerosis. Electronically Signed   By: Rolm Baptise M.D.   On: 06/17/2020 10:28    ____________________________________________  PROCEDURES   Procedure(s) performed (including Critical Care):  Procedures  ____________________________________________  INITIAL IMPRESSION / MDM / [place] / ED COURSE  As part of my medical decision making, I reviewed the following data within the [place] notes reviewed and incorporated, Old chart reviewed, Notes from prior ED visits, and [place] Controlled Substance Database       *Kyle DEINES Sr. was evaluated in Emergency Department on 06/17/2020 for the symptoms described in the history of present illness. He was evaluated in the context of the global COVID-19 pandemic, which necessitated consideration that the patient might be at risk for infection with the SARS-CoV-2 virus that causes COVID-19. Institutional protocols and algorithms that pertain to the evaluation of patients at risk for COVID-19 are in a  state of rapid change based on information released by regulatory bodies including the CDC and federal and state organizations. These policies and algorithms were followed during the patient's care in the ED.  Some ED evaluations and interventions may be delayed as a result of limited staffing during the pandemic.*     Medical Decision Making: Very well-appearing 80 year old male here with transient right flank pain in the setting of fall several weeks ago.  Clinically, exam and history is  consistent with possible mild rib sprain or intercostal spasm in the setting of recent trauma.  The pain is resolved now.  No clinical exam evidence of shingles or skin abnormality.  CT stone study negative for acute abnormality, specifically no evidence of kidney stone.  He has cholelithiasis but [place] no right upper quadrant tenderness, nausea, vomiting, and LFTs and white count are normal.  Do not suspect symptomatic biliary colic or cholecystitis.  Chest x-ray is clear.  No evidence of pneumonia or rib fracture.  Will treat symptomatically, discharged with good return precautions.  No substernal or left-sided chest pressure, shortness of breath, or signs to suggest referred pain from cardiac source.  ____________________________________________  FINAL CLINICAL IMPRESSION(S) / ED DIAGNOSES  Final diagnoses:  Right flank pain     MEDICATIONS GIVEN DURING THIS VISIT:  Medications - No data to display   ED Discharge Orders         Ordered    naproxen (NAPROSYN) 375 MG tablet  2 times daily with meals        06/17/20 1117           Note:  This document was prepared using Dragon voice recognition software and may include unintentional dictation errors.   Duffy Bruce, MD 06/17/20 1118

## 2020-06-17 NOTE — ED Triage Notes (Signed)
Patient c/o falling 59f off ladder three weeks ago. Fell onto a flower pot hitting his right side. Denies pain down leg. Denies cp or abdominal pain. Reports pain this morning was excruciating

## 2020-06-17 NOTE — Discharge Instructions (Addendum)
Take the naprosyn with meals twice a day, for anti-inflammation and pain  Take with a meal  Consider taking an antacid with the naproxen to prevent reflux/heartburn  Try to minimize heavy lifting/twisting for the next week

## 2020-11-23 ENCOUNTER — Other Ambulatory Visit: Payer: Self-pay

## 2020-11-23 ENCOUNTER — Other Ambulatory Visit (HOSPITAL_COMMUNITY): Payer: Self-pay | Admitting: Gastroenterology

## 2020-11-23 ENCOUNTER — Other Ambulatory Visit: Payer: Self-pay | Admitting: Gastroenterology

## 2020-11-23 ENCOUNTER — Ambulatory Visit
Admission: RE | Admit: 2020-11-23 | Discharge: 2020-11-23 | Disposition: A | Discharge status: Home / Self Care | Payer: Medicare Other | Source: Ambulatory Visit | Attending: Gastroenterology | Admitting: Gastroenterology

## 2020-11-23 DIAGNOSIS — R1013 Epigastric pain: Secondary | ICD-10-CM

## 2020-11-23 DIAGNOSIS — R17 Unspecified jaundice: Secondary | ICD-10-CM

## 2020-11-23 DIAGNOSIS — K8020 Calculus of gallbladder without cholecystitis without obstruction: Secondary | ICD-10-CM

## 2020-11-25 ENCOUNTER — Other Ambulatory Visit: Payer: Self-pay | Admitting: Gastroenterology

## 2020-11-25 ENCOUNTER — Other Ambulatory Visit (HOSPITAL_COMMUNITY): Payer: Self-pay | Admitting: Gastroenterology

## 2020-11-25 DIAGNOSIS — K8020 Calculus of gallbladder without cholecystitis without obstruction: Secondary | ICD-10-CM

## 2020-11-25 DIAGNOSIS — R7989 Other specified abnormal findings of blood chemistry: Secondary | ICD-10-CM

## 2020-11-25 DIAGNOSIS — R1013 Epigastric pain: Secondary | ICD-10-CM

## 2020-12-12 ENCOUNTER — Ambulatory Visit
Admission: RE | Admit: 2020-12-12 | Discharge: 2020-12-12 | Disposition: A | Discharge status: Home / Self Care | Payer: Medicare Other | Source: Ambulatory Visit | Attending: Gastroenterology | Admitting: Gastroenterology

## 2020-12-12 ENCOUNTER — Other Ambulatory Visit: Payer: Self-pay

## 2020-12-12 ENCOUNTER — Other Ambulatory Visit: Payer: Self-pay | Admitting: Gastroenterology

## 2020-12-12 DIAGNOSIS — K8020 Calculus of gallbladder without cholecystitis without obstruction: Secondary | ICD-10-CM | POA: Diagnosis present

## 2020-12-12 DIAGNOSIS — K8500 Idiopathic acute pancreatitis without necrosis or infection: Secondary | ICD-10-CM

## 2020-12-12 DIAGNOSIS — R1013 Epigastric pain: Secondary | ICD-10-CM | POA: Diagnosis present

## 2020-12-12 DIAGNOSIS — R7989 Other specified abnormal findings of blood chemistry: Secondary | ICD-10-CM | POA: Insufficient documentation

## 2020-12-12 MED ORDER — GADOBUTROL 1 MMOL/ML IV SOLN
9.0000 mL | Freq: Once | INTRAVENOUS | Status: AC | PRN
  Administered 2020-12-12: 9 mL via INTRAVENOUS

## 2021-08-26 IMAGING — CT CT RENAL STONE PROTOCOL
2 of 4 series · 16 of 46 positions shown, 18 images · non-contrast
Comparison: None.

CLINICAL DATA: Right flank pain, fall

EXAM:
CT ABDOMEN AND PELVIS WITHOUT CONTRAST
TECHNIQUE: Multidetector CT imaging of the abdomen and pelvis was performed
following the standard protocol without IV contrast.

[Series 2: stone full standard · axial · 0.88mm/px · z∈[+530,+960]mm · 13 of 96 slices shown, 15 images]
[im 5/96  soft-tissue]
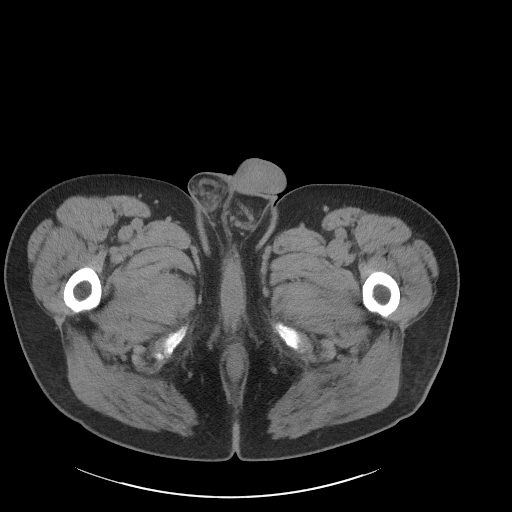
[im 5/96  bone]
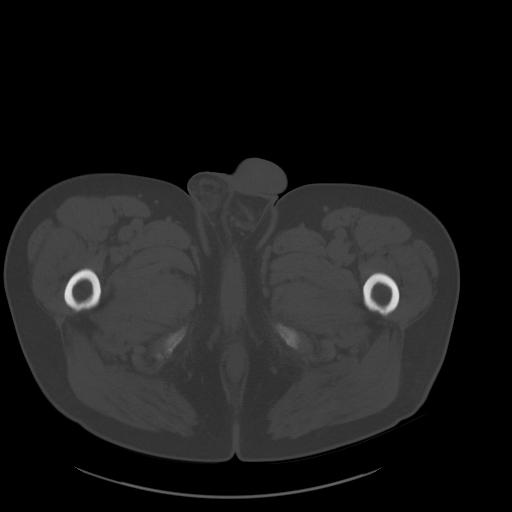
[im 13/96  soft-tissue]
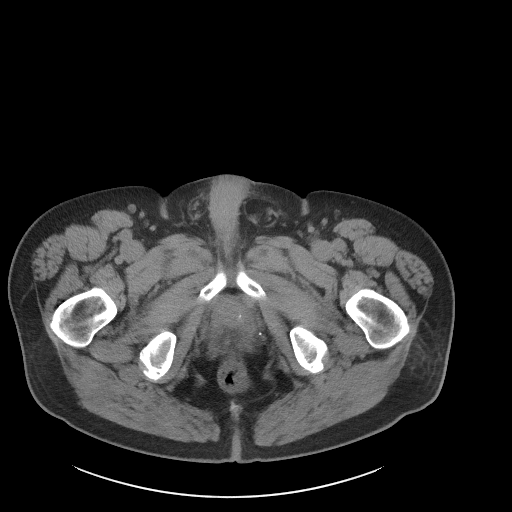
[im 21/96  soft-tissue]
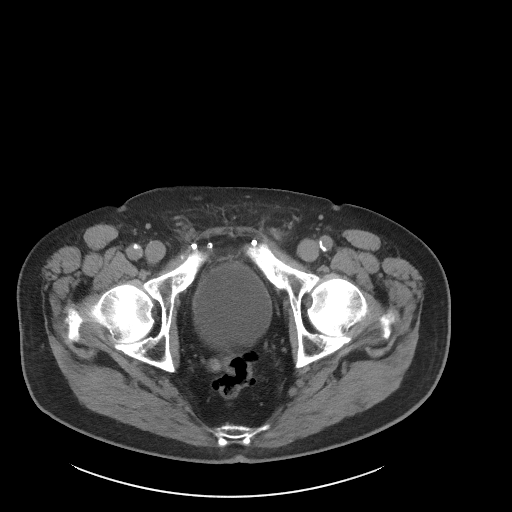
[im 25/96  soft-tissue]
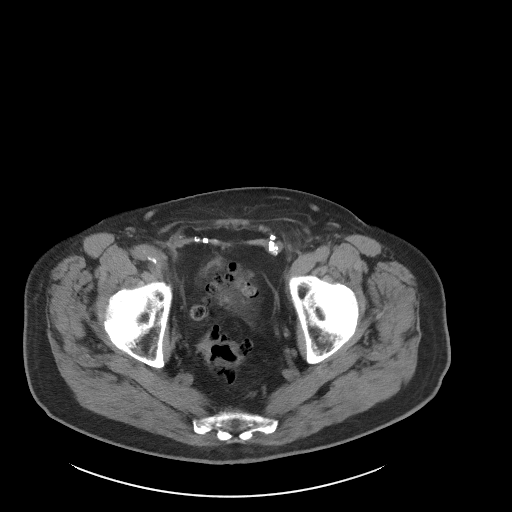
[im 34/96  soft-tissue]
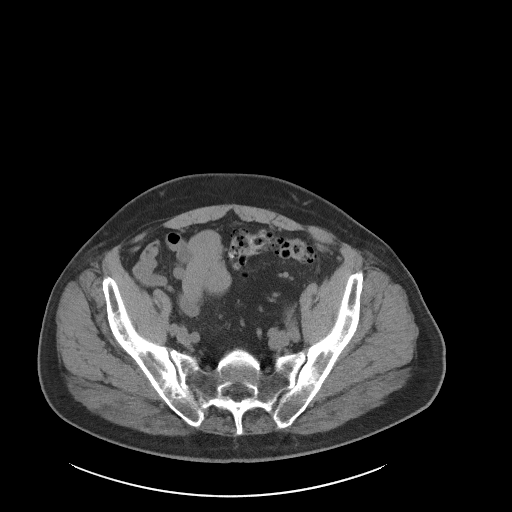
[im 42/96  soft-tissue]
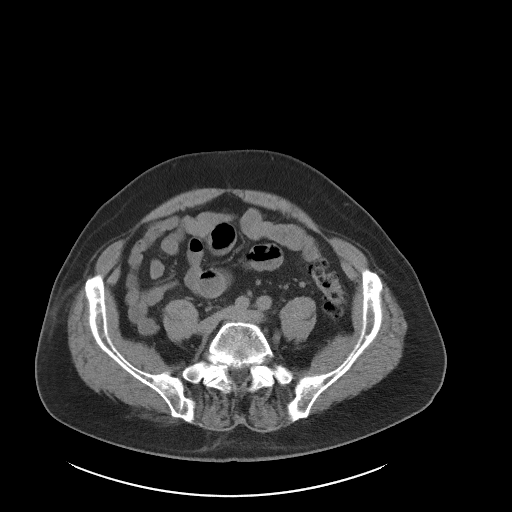
[im 50/96  soft-tissue]
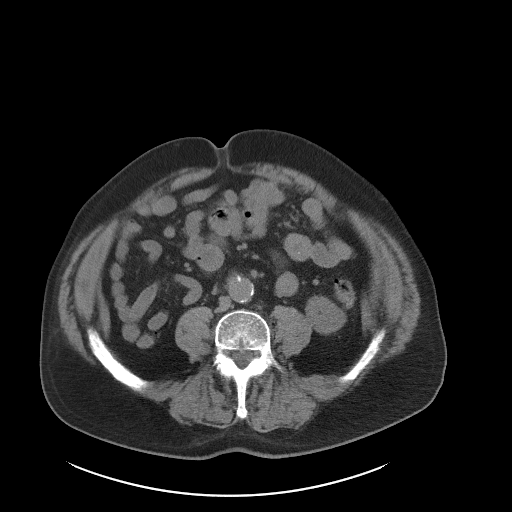
[im 54/96  soft-tissue]
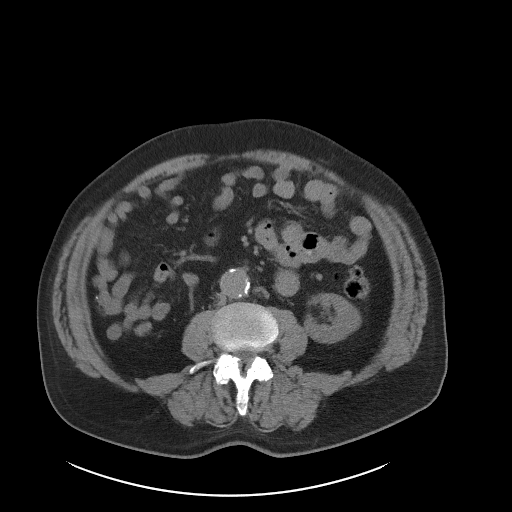
[im 62/96  soft-tissue]
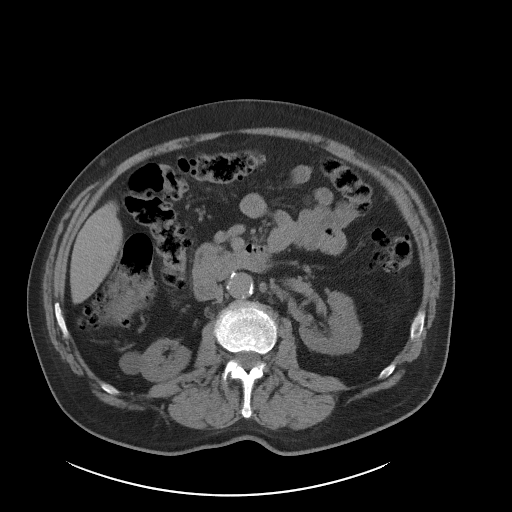
[im 62/96  bone]
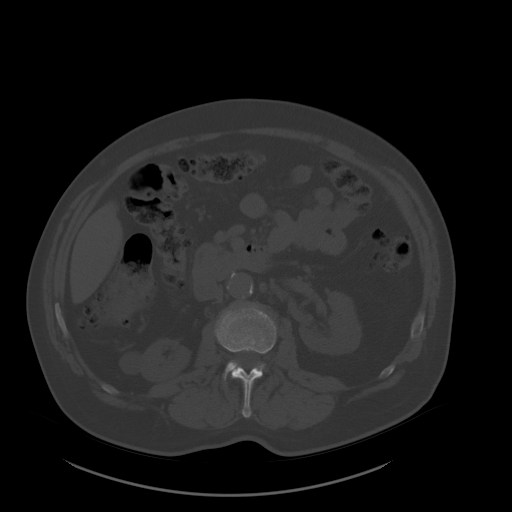
[im 71/96  soft-tissue]
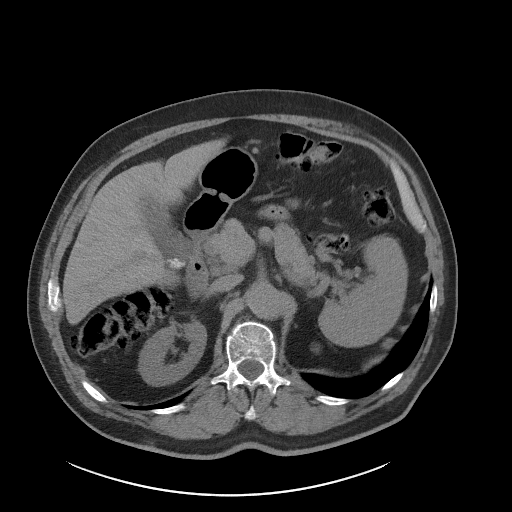
[im 75/96  soft-tissue]
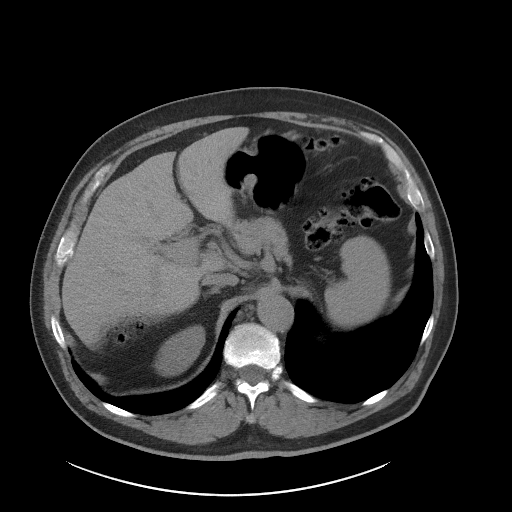
[im 83/96  soft-tissue]
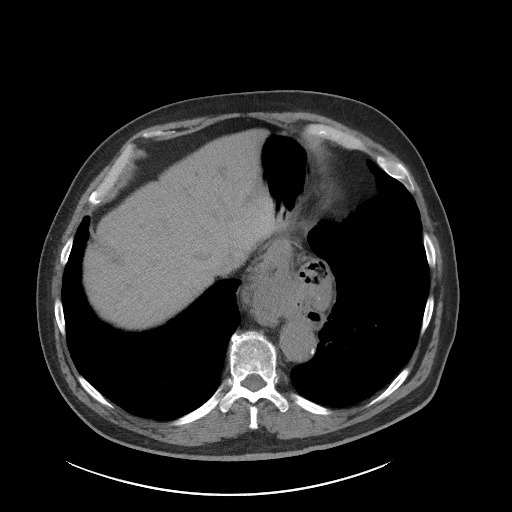
[im 91/96  soft-tissue]
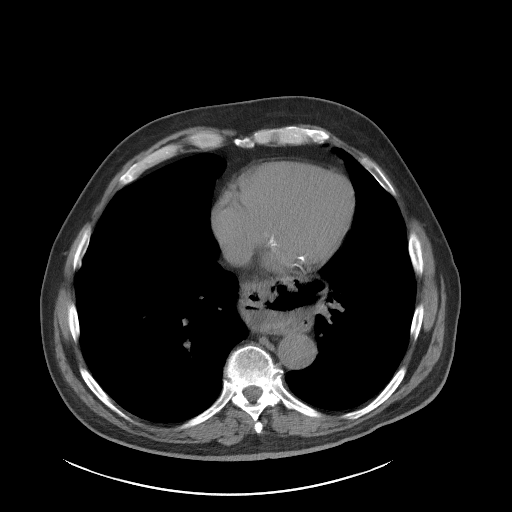

[Series 5: coronal · coronal · 0.79mm/px · 3 of 168 slices shown]
[im 56/168  soft-tissue]
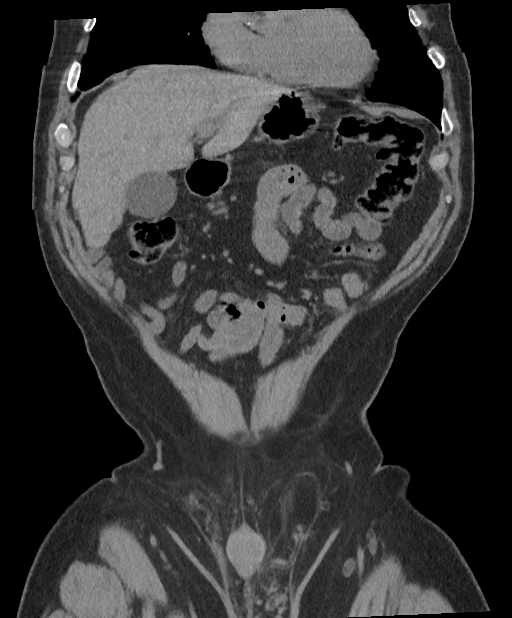
[im 75/168  soft-tissue]
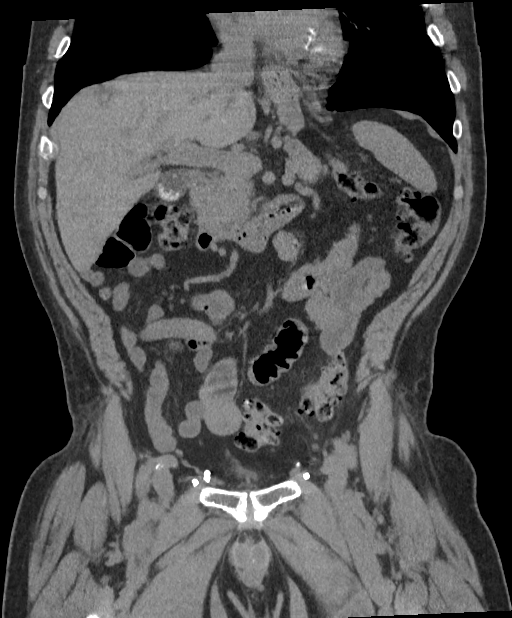
[im 93/168  soft-tissue]
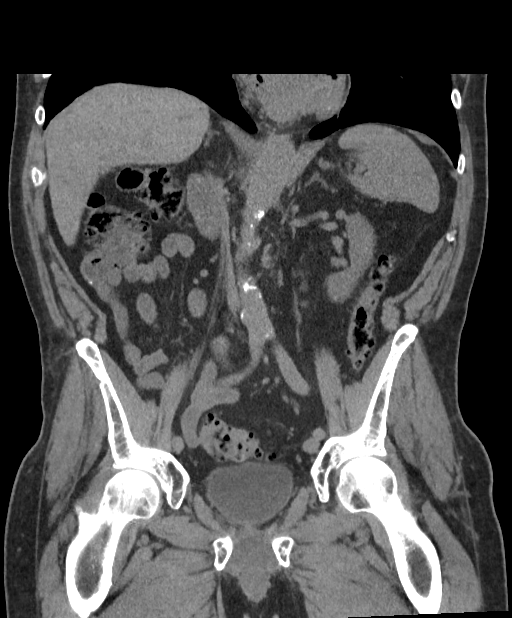

[16 of 46 positions shown; findings below may reference images not displayed]

FINDINGS: Lower chest: Large hiatal hernia.  No acute abnormality.

Hepatobiliary: Layering small gallstones in the gallbladder. No
biliary ductal dilatation. No focal hepatic abnormality. No evidence
of a hepatic injury or perihepatic hematoma.

Pancreas: No focal abnormality or ductal dilatation.

Spleen: No focal abnormality.  Normal size.

Adrenals/Urinary Tract: No renal or ureteral stones. No
hydronephrosis. Small cyst off the lower pole of the right kidney
measures up to 19 mm. Adrenal glands and urinary bladder
unremarkable.

Stomach/Bowel: Diffuse colonic diverticulosis. No active
diverticulitis. Stomach and small bowel decompressed, unremarkable.
Appendix not visualized. No inflammatory process in the right lower
quadrant.

Vascular/Lymphatic: Aortic atherosclerosis. No evidence of aneurysm
or adenopathy.

Reproductive: No visible focal abnormality.

Other: No free fluid or free air.

Musculoskeletal: No acute bony abnormality.
IMPRESSION: No evidence of solid organ injury.

Cholelithiasis.

Diffuse colonic diverticulosis.

Large hiatal hernia.

Aortic atherosclerosis.

## 2021-11-01 ENCOUNTER — Other Ambulatory Visit: Payer: Self-pay | Admitting: Orthopedic Surgery

## 2021-11-02 NOTE — Progress Notes (Signed)
Sent message, via epic in basket, requesting orders in epic from surgeon.  

## 2021-11-04 NOTE — Progress Notes (Signed)
COVID Vaccine received:  []  No [x]  Yes Date of any COVID positive Test in last 90 days:None  PCP - Derinda Late, MD [phone] [address]    [facility]   [place], [place] [address]   [phone] (Work)   [phone] (Fax)      Cardiologist - none  Pulmonology- Laverle Hobby, MD  Neurology- Jennings Books, MD [place]   [facility]   [place], [place] [address]   [phone] (Work)   [phone] (Fax)    Chest x-ray - 06-17-2020  Epic  (Dr. Mayer Camel ordered CXR to be done at [facility]) EKG -  01-03-2018    will repeat at [facility] Stress Test - n/a ECHO - n/a Cardiac Cath - n/a  Pacemaker/ICD device     [x]  N/A Spinal Cord Stimulator:[x]  No []  Yes      (Remind patient to bring remote DOS) Other Implants:   History of Sleep Apnea? [x]  No []  Yes   Sleep Study Date:   CPAP used?- [x]  No []  Yes  (Instruct to bring their mask & Tubing)  Does the patient monitor blood sugar? []  No []  Yes  [x]  N/A  Blood Thinner Instructions: Aspirin Instructions:  ASA 81 Last Dose:  ERAS Protocol Ordered: []  No  [x]  Yes PRE-SURGERY [x]  ENSURE  []  G2   Comments: Has Crohn's Disease  Activity level: Patient can climb a flight of stairs without difficulty; [x]  No CP  [x]  No SOB,  but would have __knee pain_   Anesthesia review: Myasthenia Gravis, COPD/ emphysema, HTN  Patient denies shortness of breath, fever, cough and chest pain at PAT appointment.  Patient verbalized understanding and agreement to the Pre-Surgical Instructions that were given to them at this PAT appointment. Patient was also educated of the need to review these PAT instructions again prior to his/her surgery.I reviewed the appropriate phone numbers to call if they have any and questions or concerns.

## 2021-11-04 NOTE — Patient Instructions (Signed)
SURGICAL WAITING ROOM VISITATION Patients having surgery or a procedure may have no more than 2 support people in the waiting area - these visitors may rotate in the visitor waiting room.   Children under the age of 67 must have an adult with them who is not the patient. If the patient needs to stay at the hospital during part of their recovery, the visitor guidelines for inpatient rooms apply.  PRE-OP VISITATION  Pre-op nurse will coordinate an appropriate time for 1 support person to accompany the patient in pre-op.  This support person may not rotate.  This visitor will be contacted when the time is appropriate for the visitor to come back in the pre-op area.  Please refer to the [facility] website for the visitor guidelines for Inpatients (after your surgery is over and you are in a regular room).  You are not required to quarantine at this time prior to your surgery. However, you must do this: Hand Hygiene often Do NOT share personal items Notify your provider if you are in close contact with someone who has COVID or you develop fever 100.4 or greater, new onset of sneezing, cough, sore throat, shortness of breath or body aches.   If you received a COVID test during your pre-op visit  it is requested that you wear a mask when out in public, stay away from anyone that may not be feeling well and notify your surgeon if you develop symptoms. If you test positive for Covid or have been in contact with anyone that has tested positive in the last 10 days please notify you surgeon.       Your procedure is scheduled on:  Monday  November 20, 2021  Report to [facility] Main Entrance.  Report to admitting at: 07:15 AM  +++++Call this number if you have any questions or problems the morning of surgery [phone]  Do not eat food :After Midnight the night prior to your surgery/procedure.  After Midnight you may have the following liquids until   06:45 AM DAY OF SURGERY  Clear  Liquid Diet Water Black Coffee (sugar ok, NO MILK/CREAM OR CREAMERS)  Tea (sugar ok, NO MILK/CREAM OR CREAMERS) regular and decaf                             Plain Jell-O  with no fruit (NO RED)                                           Fruit ices (not with fruit pulp, NO RED)                                     Popsicles (NO RED)                                                                  Juice: apple, WHITE grape, WHITE cranberry Sports drinks like Gatorade or Powerade (NO RED)  The day of surgery:  Drink ONE (1) Pre-Surgery Clear Ensure at   06:45  AM the morning of surgery. Drink in one sitting. Do not sip.  This drink was given to you during your hospital pre-op appointment visit. Nothing else to drink after completing the Pre-Surgery Clear Ensure or G2 : No candy, chewing gum or throat lozenges.    FOLLOW ANY ADDITIONAL PRE OP INSTRUCTIONS YOU RECEIVED FROM YOUR SURGEON'S OFFICE!!!   Oral Hygiene is also important to reduce your risk of infection.        Remember - BRUSH YOUR TEETH THE MORNING OF SURGERY WITH YOUR REGULAR TOOTHPASTE    Take ONLY these medicines the morning of surgery with A SIP OF WATER:  pyridostigmine, omeprazole, finasteride, and If needed , you may use your Albuterol inhaler,                     You may not have any metal on your body including  jewelry, and body piercing  Do not wear lotions, powders, cologne, or deodorant  Men may shave face and neck.  Contacts, Hearing Aids, dentures or bridgework may not be worn into surgery.     DO NOT [place]. PHARMACY WILL DISPENSE MEDICATIONS LISTED ON YOUR MEDICATION LIST TO YOU DURING YOUR ADMISSION [place]!   Patients discharged on the day of surgery will not be allowed to drive home.  Someone NEEDS to stay with you for the first 24 hours after anesthesia.  Special Instructions: Bring a copy of your healthcare power of attorney and  living will documents the day of surgery, if you wish to have them scanned into your [facility] Records- EPIC  Please read over the following fact sheets you were given: IF YOU HAVE QUESTIONS ABOUT YOUR PRE-OP INSTRUCTIONS, PLEASE CALL [phone]  ([place])   [place] - Preparing for Surgery Before surgery, you can play an important role.  Because skin is not sterile, your skin needs to be as free of germs as possible.  You can reduce the number of germs on your skin by washing with CHG (chlorahexidine gluconate) soap before surgery.  CHG is an antiseptic cleaner which kills germs and bonds with the skin to continue killing germs even after washing. Please DO NOT use if you have an allergy to CHG or antibacterial soaps.  If your skin becomes reddened/irritated stop using the CHG and inform your nurse when you arrive at Short Stay. Do not shave (including legs and underarms) for at least 48 hours prior to the first CHG shower.  You may shave your face/neck.  Please follow these instructions carefully:  1.  Shower with CHG Soap the night before surgery and the  morning of surgery.  2.  If you choose to wash your hair, wash your hair first as usual with your normal  shampoo.  3.  After you shampoo, rinse your hair and body thoroughly to remove the shampoo.                             4.  Use CHG as you would any other liquid soap.  You can apply chg directly to the skin and wash.  Gently with a scrungie or clean washcloth.  5.  Apply the CHG Soap to your body ONLY FROM THE NECK DOWN.   Do not use on face/ open  Wound or open sores. Avoid contact with eyes, ears mouth and genitals (private parts).                       Wash face,  Genitals (private parts) with your normal soap.             6.  Wash thoroughly, paying special attention to the area where your  surgery  will be performed.  7.  Thoroughly rinse your body with warm water from the neck down.  8.  DO NOT  shower/wash with your normal soap after using and rinsing off the CHG Soap.            9.  Pat yourself dry with a clean towel.            10.  Wear clean pajamas.            11.  Place clean sheets on your bed the night of your first shower and do not  sleep with pets.  ON THE DAY OF SURGERY : Do not apply any lotions/deodorants the morning of surgery.  Please wear clean clothes to the hospital/surgery center.    FAILURE TO FOLLOW THESE INSTRUCTIONS MAY RESULT IN THE CANCELLATION OF YOUR SURGERY  PATIENT SIGNATURE_________________________________  NURSE SIGNATURE__________________________________  ________________________________________________________________________        Adam Phenix    An incentive spirometer is a tool that can help keep your lungs clear and active. This tool measures how well you are filling your lungs with each breath. Taking long deep breaths may help reverse or decrease the chance of developing breathing (pulmonary) problems (especially infection) following: A long period of time when you are unable to move or be active. BEFORE THE PROCEDURE  If the spirometer includes an indicator to show your best effort, your nurse or respiratory therapist will set it to a desired goal. If possible, sit up straight or lean slightly forward. Try not to slouch. Hold the incentive spirometer in an upright position. INSTRUCTIONS FOR USE  Sit on the edge of your bed if possible, or sit up as far as you can in bed or on a chair. Hold the incentive spirometer in an upright position. Breathe out normally. Place the mouthpiece in your mouth and seal your lips tightly around it. Breathe in slowly and as deeply as possible, raising the piston or the ball toward the top of the column. Hold your breath for 3-5 seconds or for as long as possible. Allow the piston or ball to fall to the bottom of the column. Remove the mouthpiece from your mouth and breathe out  normally. Rest for a few seconds and repeat Steps 1 through 7 at least 10 times every 1-2 hours when you are awake. Take your time and take a few normal breaths between deep breaths. The spirometer may include an indicator to show your best effort. Use the indicator as a goal to work toward during each repetition. After each set of 10 deep breaths, practice coughing to be sure your lungs are clear. If you have an incision (the cut made at the time of surgery), support your incision when coughing by placing a pillow or rolled up towels firmly against it. Once you are able to get out of bed, walk around indoors and cough well. You may stop using the incentive spirometer when instructed by your caregiver.  RISKS AND COMPLICATIONS Take your time so you do not get dizzy or light-headed. If you  are in pain, you may need to take or ask for pain medication before doing incentive spirometry. It is harder to take a deep breath if you are having pain. AFTER USE Rest and breathe slowly and easily. It can be helpful to keep track of a log of your progress. Your caregiver can provide you with a simple table to help with this. If you are using the spirometer at home, follow these instructions: [place] IF:  You are having difficultly using the spirometer. You have trouble using the spirometer as often as instructed. Your pain medication is not giving enough relief while using the spirometer. You develop fever of 100.5 F (38.1 C) or higher.                                                                                                    SEEK IMMEDIATE MEDICAL CARE IF:  You cough up bloody sputum that had not been present before. You develop fever of 102 F (38.9 C) or greater. You develop worsening pain at or near the incision site. MAKE SURE YOU:  Understand these instructions. Will watch your condition. Will get help right away if you are not doing well or get worse. Document Released:  06/04/2006 Document Revised: 04/16/2011 Document Reviewed: 08/05/2006 [facility] Patient Information 2014 [place], [place].

## 2021-11-07 ENCOUNTER — Encounter (HOSPITAL_COMMUNITY)
Admission: RE | Admit: 2021-11-07 | Discharge: 2021-11-07 | Disposition: A | Discharge status: Home / Self Care | Payer: Medicare Other | Source: Ambulatory Visit | Attending: Orthopedic Surgery | Admitting: Orthopedic Surgery

## 2021-11-07 ENCOUNTER — Other Ambulatory Visit: Payer: Self-pay

## 2021-11-07 ENCOUNTER — Ambulatory Visit (HOSPITAL_COMMUNITY)
Admission: RE | Admit: 2021-11-07 | Discharge: 2021-11-07 | Disposition: A | Discharge status: Home / Self Care | Payer: Medicare Other | Source: Ambulatory Visit | Attending: Orthopedic Surgery | Admitting: Orthopedic Surgery

## 2021-11-07 ENCOUNTER — Encounter (HOSPITAL_COMMUNITY): Payer: Self-pay

## 2021-11-07 VITALS — BP 128/88 | HR 72 | Temp 98.0°F | Resp 16 | Ht 70.0 in | Wt 205.0 lb

## 2021-11-07 DIAGNOSIS — I10 Essential (primary) hypertension: Secondary | ICD-10-CM

## 2021-11-07 DIAGNOSIS — Z01818 Encounter for other preprocedural examination: Secondary | ICD-10-CM | POA: Diagnosis present

## 2021-11-07 DIAGNOSIS — N401 Enlarged prostate with lower urinary tract symptoms: Secondary | ICD-10-CM

## 2021-11-07 HISTORY — DX: Unspecified osteoarthritis, unspecified site: M19.90

## 2021-11-07 HISTORY — DX: Essential (primary) hypertension: I10

## 2021-11-07 HISTORY — DX: Emphysema, unspecified: J43.9

## 2021-11-07 HISTORY — DX: Chronic obstructive pulmonary disease, unspecified: J44.9

## 2021-11-07 LAB — BASIC METABOLIC PANEL
Anion gap: 5 (ref 5–15)
BUN: 17 mg/dL (ref 8–23)
CO2: 25 mmol/L (ref 22–32)
Calcium: 9 mg/dL (ref 8.9–10.3)
Chloride: 109 mmol/L (ref 98–111)
Creatinine, Ser: 0.96 mg/dL (ref 0.61–1.24)
GFR, Estimated: >60 mL/min (ref >60)
Glucose, Bld: 101 mg/dL — ABNORMAL HIGH (ref 70–99)
Potassium: 4.8 mmol/L (ref 3.5–5.1)
Sodium: 139 mmol/L (ref 135–145)

## 2021-11-07 LAB — CBC
HCT: 44.8 % (ref 39.0–52.0)
Hemoglobin: 14.9 g/dL (ref 13.0–17.0)
MCH: 33.7 pg (ref 26.0–34.0)
MCHC: 33.3 g/dL (ref 30.0–36.0)
MCV: 101.4 fL — ABNORMAL HIGH (ref 80.0–100.0)
Platelets: 285 10*3/uL (ref 150–400)
RBC: 4.42 MIL/uL (ref 4.22–5.81)
RDW: 14.3 % (ref 11.5–15.5)
WBC: 6.8 10*3/uL (ref 4.0–10.5)
nRBC: 0 % (ref 0.0–0.2)

## 2021-11-07 LAB — SURGICAL PCR SCREEN
MRSA, PCR: NEGATIVE
Staphylococcus aureus: NEGATIVE

## 2021-11-15 DIAGNOSIS — M1711 Unilateral primary osteoarthritis, right knee: Secondary | ICD-10-CM | POA: Diagnosis present

## 2021-11-15 NOTE — H&P (Signed)
TOTAL KNEE ADMISSION H&P  Patient is being admitted for right total knee arthroplasty.  Subjective:  Chief Complaint:right knee pain.  HPI: Kyle Gully Sr., 81 y.o. male, has a history of pain and functional disability in the right knee due to arthritis and has failed non-surgical conservative treatments for greater than 12 weeks to includeNSAID's and/or analgesics, corticosteriod injections, flexibility and strengthening excercises, weight reduction as appropriate, and activity modification.  Onset of symptoms was gradual, starting  several  years ago with gradually worsening course since that time. The patient noted no past surgery on the right knee(s).  Patient currently rates pain in the right knee(s) at 10 out of 10 with activity. Patient has night pain, worsening of pain with activity and weight bearing, pain that interferes with activities of daily living, pain with passive range of motion, crepitus, and joint swelling.  Patient has evidence of joint subluxation and joint space narrowing by imaging studies.  There is no active infection.  Patient Active Problem List   Diagnosis Date Noted   Osteoarthritis of right knee 11/15/2021   Benign essential hypertension 01/07/2018   Crohn's colitis ([place]) 01/07/2018   Hyperlipidemia LDL goal <130 01/07/2018   Myasthenia ([place]) 12/28/2017   Benign non-nodular prostatic hyperplasia with lower urinary tract symptoms 12/28/2013   STRICTURE AND STENOSIS OF ESOPHAGUS 10/20/2007   ACID REFLUX DISEASE 10/20/2007   HIATAL HERNIA 10/20/2007   CROHN'S DISEASE 10/20/2007   DIVERTICULOSIS OF COLON 10/20/2007   SEBORRHEIC DERMATITIS 10/20/2007   Past Medical History:  Diagnosis Date   Arthritis    COPD (chronic obstructive pulmonary disease) ([place])    Crohn's disease ([place])    Emphysema, unspecified ([place])    Hypertension    Myasthenia gravis ([place])     Past Surgical History:  Procedure Laterality Date   JOINT REPLACEMENT Left    total knee  replacement    Dr. Marry Guan   TONSILLECTOMY     WISDOM TOOTH EXTRACTION      No current facility-administered medications for this encounter.   Current Outpatient Medications  Medication Sig Dispense Refill Last Dose   acetaminophen (TYLENOL) 325 MG tablet Take 650 mg by mouth 3 (three) times daily as needed (pain).      albuterol (PROVENTIL HFA;VENTOLIN HFA) 108 (90 Base) MCG/ACT inhaler Inhale 2 puffs into the lungs every 6 (six) hours as needed for wheezing or shortness of breath. (Patient taking differently: Inhale 2 puffs into the lungs as needed for wheezing or shortness of breath.) 1 Inhaler 0    aspirin EC 81 MG tablet Take 81 mg by mouth daily.       balsalazide (COLAZAL) 750 MG capsule Take 750 mg by mouth 3 (three) times daily.  3    Biotin 1 MG CAPS Take 1 mg by mouth daily.      finasteride (PROSCAR) 5 MG tablet Take 5 mg by mouth daily.  3    Multiple Vitamins-Minerals (MULTIVITAMIN WITH MINERALS) tablet Take 1 tablet by mouth daily.      Multiple Vitamins-Minerals (PRESERVISION AREDS PO) Take 1 tablet by mouth in the morning and at bedtime.      omeprazole (PRILOSEC) 20 MG capsule Take 20 mg by mouth 2 (two) times daily.      prednisoLONE acetate (PRED FORTE) 1 % ophthalmic suspension Place 1 drop into the left eye 2 (two) times daily.      pyridostigmine (MESTINON) 60 MG tablet Take 0.5 tablets (30 mg total) by mouth every 8 (eight) hours. (Patient  taking differently: Take 60 mg by mouth 3 (three) times daily.) 90 tablet 0    tamsulosin (FLOMAX) 0.4 MG CAPS capsule Take 0.4 mg by mouth daily.  3    valACYclovir (VALTREX) 500 MG tablet Take 500 mg by mouth daily.      vitamin B-12 (CYANOCOBALAMIN) 500 MCG tablet Take 500 mcg by mouth daily.       Bromfenac Sodium 0.09 % SOLN Apply to eye as directed. (Patient not taking: Reported on 11/07/2021)   Not Taking   folic acid (FOLVITE) 1 MG tablet Take 1 mg by mouth daily.  (Patient not taking: Reported on 11/07/2021)   Not Taking    methotrexate (RHEUMATREX) 2.5 MG tablet Take 2 tablets (5 mg total) by mouth once a week. Every Wednesday  Caution:Chemotherapy. Protect from light. (Patient taking differently: Take 10 mg by mouth once a week.) 4 tablet 0    omeprazole (PRILOSEC) 40 MG capsule Take 40 mg by mouth daily. (Patient not taking: Reported on 11/07/2021)  3 Not Taking   Facility-Administered Medications Ordered in Other Encounters  Medication Dose Route Frequency Provider Last Rate Last Admin   albuterol (PROVENTIL) (2.5 MG/3ML) 0.083% nebulizer solution 2.5 mg  2.5 mg Nebulization Once Laverle Hobby, MD       No Known Allergies  Social History   Tobacco Use   Smoking status: Former    Packs/day: 1.50    Years: 95.00    Total pack years: 142.50    Types: Cigarettes    Quit date: 1995    Years since quitting: 28.7   Smokeless tobacco: Never  Substance Use Topics   Alcohol use: Not Currently    Family History  Problem Relation Age of Onset   Brain cancer Father      Review of Systems  Constitutional: Negative.   HENT: Negative.    Eyes: Negative.   Respiratory: Negative.    Cardiovascular: Negative.   Gastrointestinal:        Crhons  Endocrine: Negative.   Genitourinary: Negative.   Musculoskeletal:  Positive for arthralgias.  Allergic/Immunologic: Negative.   Neurological: Negative.   Hematological: Negative.   Psychiatric/Behavioral: Negative.      Objective:  Physical Exam Constitutional:      Appearance: Normal appearance. He is normal weight.  HENT:     Head: Normocephalic and atraumatic.     Nose: Nose normal.  Eyes:     Pupils: Pupils are equal, round, and reactive to light.  Cardiovascular:     Pulses: Normal pulses.  Pulmonary:     Effort: Pulmonary effort is normal.  Musculoskeletal:     Cervical back: Normal range of motion and neck supple.     Comments: 10 varus deformity of the right knee exquisitely tender along the medial joint line 1+ effusion.  Range of  motion 10 forward flexion contracture, flexion 120.  Good quadriceps and hamstring power.  Skin is intact neurovascular intact distally.  No discomfort with internal or external rotation of the hip.  Skin:    General: Skin is warm and dry.  Neurological:     General: No focal deficit present.     Mental Status: He is alert and oriented to person, place, and time. Mental status is at baseline.  Psychiatric:        Mood and Affect: Mood normal.        Behavior: Behavior normal.        Thought Content: Thought content normal.  Judgment: Judgment normal.     Vital signs in last 24 hours:    Labs:   Estimated body mass index is 29.41 kg/m as calculated from the following:   Height as of 11/07/21: 5' 10"  (1.778 m).   Weight as of 11/07/21: 93 kg.   Imaging Review Plain radiographs demonstrate  AP Rosen were lateral and sunrise x-rays show end-stage arthritis of the right knee bone-on-bone to the medial compartment, 10 varus deformity, lateral subluxation of tibia beneath femur.      Assessment/Plan:  End stage arthritis, right knee   The patient history, physical examination, clinical judgment of the provider and imaging studies are consistent with end stage degenerative joint disease of the right knee(s) and total knee arthroplasty is deemed medically necessary. The treatment options including medical management, injection therapy arthroscopy and arthroplasty were discussed at length. The risks and benefits of total knee arthroplasty were presented and reviewed. The risks due to aseptic loosening, infection, stiffness, patella tracking problems, thromboembolic complications and other imponderables were discussed. The patient acknowledged the explanation, agreed to proceed with the plan and consent was signed. Patient is being admitted for inpatient treatment for surgery, pain control, PT, OT, prophylactic antibiotics, VTE prophylaxis, progressive ambulation and ADL's and  discharge planning. The patient is planning to be discharged home with home health services     Patient's anticipated LOS is less than 2 midnights, meeting these requirements: - Younger than 25 - Lives within 1 hour of care - Has a competent adult at home to recover with post-op recover - NO history of  - Chronic pain requiring opiods  - Diabetes  - Coronary Artery Disease  - Heart failure  - Heart attack  - Stroke  - DVT/VTE  - Cardiac arrhythmia  - Respiratory Failure/COPD  - Renal failure  - Anemia  - Advanced Liver disease

## 2021-11-19 NOTE — Anesthesia Preprocedure Evaluation (Signed)
Anesthesia Evaluation  Patient identified by MRN, date of birth, ID band Patient awake    Reviewed: Allergy & Precautions, NPO status , Patient's Chart, lab work & pertinent test results  Airway Mallampati: II  TM Distance: >3 FB Neck ROM: Full    Dental  (+) Edentulous Upper, Edentulous Lower   Pulmonary COPD, former smoker,  Quit smoking 1995, 140 pack year history  Rescue inhaler 1-2x/wk   Pulmonary exam normal breath sounds clear to auscultation       Cardiovascular hypertension (156/80 in preop, per pt variable. no home meds), Normal cardiovascular exam Rhythm:Regular Rate:Normal     Neuro/Psych Myasthenia: takes methotrexate and pyridostigmine 11m TID- took it this AM   Neuromuscular disease (myasthenia gravis) negative psych ROS   GI/Hepatic Neg liver ROS, GERD  Medicated and Controlled,  Endo/Other  negative endocrine ROS  Renal/GU negative Renal ROS  negative genitourinary   Musculoskeletal  (+) Arthritis , Osteoarthritis,    Abdominal   Peds  Hematology negative hematology ROS (+) Hb 14.9, plt 285   Anesthesia Other Findings   Reproductive/Obstetrics negative OB ROS                            Anesthesia Physical Anesthesia Plan  ASA: 3  Anesthesia Plan: Spinal, Regional and MAC   Post-op Pain Management: Tylenol PO (pre-op)* and Regional block*   Induction:   PONV Risk Score and Plan: 2 and Propofol infusion and TIVA  Airway Management Planned: Natural Airway and Nasal Cannula  Additional Equipment: None  Intra-op Plan:   Post-operative Plan:   Informed Consent: I have reviewed the patients History and Physical, chart, labs and discussed the procedure including the risks, benefits and alternatives for the proposed anesthesia with the patient or authorized representative who has indicated his/her understanding and acceptance.       Plan Discussed with:  CRNA  Anesthesia Plan Comments:        Anesthesia Quick Evaluation

## 2021-11-20 ENCOUNTER — Ambulatory Visit (HOSPITAL_COMMUNITY)
Admission: RE | Admit: 2021-11-20 | Discharge: 2021-11-20 | Disposition: A | Discharge status: Home / Self Care | Payer: Medicare Other | Attending: Orthopedic Surgery | Admitting: Orthopedic Surgery

## 2021-11-20 ENCOUNTER — Other Ambulatory Visit: Payer: Self-pay

## 2021-11-20 ENCOUNTER — Encounter (HOSPITAL_COMMUNITY)
Admission: RE | Disposition: A | Discharge status: Home / Self Care | Payer: Self-pay | Source: Home / Self Care | Attending: Orthopedic Surgery

## 2021-11-20 ENCOUNTER — Ambulatory Visit (HOSPITAL_BASED_OUTPATIENT_CLINIC_OR_DEPARTMENT_OTHER): Payer: Medicare Other | Admitting: Anesthesiology

## 2021-11-20 ENCOUNTER — Encounter (HOSPITAL_COMMUNITY): Payer: Self-pay | Admitting: Orthopedic Surgery

## 2021-11-20 ENCOUNTER — Ambulatory Visit (HOSPITAL_COMMUNITY): Payer: Medicare Other | Admitting: Physician Assistant

## 2021-11-20 DIAGNOSIS — I10 Essential (primary) hypertension: Secondary | ICD-10-CM | POA: Diagnosis not present

## 2021-11-20 DIAGNOSIS — J449 Chronic obstructive pulmonary disease, unspecified: Secondary | ICD-10-CM | POA: Diagnosis not present

## 2021-11-20 DIAGNOSIS — G7000 Myasthenia gravis without (acute) exacerbation: Secondary | ICD-10-CM | POA: Diagnosis not present

## 2021-11-20 DIAGNOSIS — Z87891 Personal history of nicotine dependence: Secondary | ICD-10-CM | POA: Diagnosis not present

## 2021-11-20 DIAGNOSIS — M1711 Unilateral primary osteoarthritis, right knee: Secondary | ICD-10-CM | POA: Diagnosis present

## 2021-11-20 DIAGNOSIS — K219 Gastro-esophageal reflux disease without esophagitis: Secondary | ICD-10-CM | POA: Diagnosis not present

## 2021-11-20 DIAGNOSIS — Z01818 Encounter for other preprocedural examination: Secondary | ICD-10-CM

## 2021-11-20 DIAGNOSIS — N401 Enlarged prostate with lower urinary tract symptoms: Secondary | ICD-10-CM

## 2021-11-20 HISTORY — PX: TOTAL KNEE ARTHROPLASTY: SHX125

## 2021-11-20 SURGERY — ARTHROPLASTY, KNEE, TOTAL
Anesthesia: Monitor Anesthesia Care | Site: Knee | Laterality: Right

## 2021-11-20 MED ORDER — LACTATED RINGERS IV BOLUS
500.0000 mL | Freq: Once | INTRAVENOUS | Status: AC
  Administered 2021-11-20: 500 mL via INTRAVENOUS

## 2021-11-20 MED ORDER — DEXAMETHASONE SODIUM PHOSPHATE 10 MG/ML IJ SOLN
INTRAMUSCULAR | Status: DC | PRN
  Administered 2021-11-20 (×2): 10 mg

## 2021-11-20 MED ORDER — OXYCODONE HCL 5 MG/5ML PO SOLN: 5.0000 mg | Freq: Once | ORAL | Status: DC | PRN

## 2021-11-20 MED ORDER — BUPIVACAINE LIPOSOME 1.3 % IJ SUSP: 20.0000 mL | Freq: Once | INTRAMUSCULAR | Status: DC

## 2021-11-20 MED ORDER — FENTANYL CITRATE (PF) 100 MCG/2ML IJ SOLN
INTRAMUSCULAR | Status: DC | PRN
  Administered 2021-11-20: 50 ug via INTRAVENOUS

## 2021-11-20 MED ORDER — LACTATED RINGERS IV SOLN: INTRAVENOUS | Status: DC

## 2021-11-20 MED ORDER — AMISULPRIDE (ANTIEMETIC) 5 MG/2ML IV SOLN: 10.0000 mg | Freq: Once | INTRAVENOUS | Status: DC | PRN

## 2021-11-20 MED ORDER — ACETAMINOPHEN 500 MG PO TABS
1000.0000 mg | ORAL_TABLET | Freq: Once | ORAL | Status: AC
  Administered 2021-11-20: 1000 mg via ORAL
  Filled 2021-11-20: qty 2

## 2021-11-20 MED ORDER — ONDANSETRON HCL 4 MG/2ML IJ SOLN
INTRAMUSCULAR | Status: DC | PRN
  Administered 2021-11-20: 4 mg via INTRAVENOUS

## 2021-11-20 MED ORDER — TRANEXAMIC ACID 1000 MG/10ML IV SOLN
INTRAVENOUS | Status: DC | PRN
  Administered 2021-11-20: 2000 mg via TOPICAL

## 2021-11-20 MED ORDER — TRANEXAMIC ACID 1000 MG/10ML IV SOLN
2000.0000 mg | INTRAVENOUS | Status: DC
  Filled 2021-11-20: qty 20

## 2021-11-20 MED ORDER — BUPIVACAINE LIPOSOME 1.3 % IJ SUSP
INTRAMUSCULAR | Status: AC
  Filled 2021-11-20: qty 20

## 2021-11-20 MED ORDER — ROPIVACAINE HCL 5 MG/ML IJ SOLN
INTRAMUSCULAR | Status: DC | PRN
  Administered 2021-11-20: 30 mL via PERINEURAL

## 2021-11-20 MED ORDER — CHLORHEXIDINE GLUCONATE 0.12 % MT SOLN
15.0000 mL | Freq: Once | OROMUCOSAL | Status: AC
  Administered 2021-11-20: 15 mL via OROMUCOSAL

## 2021-11-20 MED ORDER — PROPOFOL 500 MG/50ML IV EMUL
INTRAVENOUS | Status: DC | PRN
  Administered 2021-11-20: 70 ug/kg/min via INTRAVENOUS

## 2021-11-20 MED ORDER — SODIUM CHLORIDE 0.9 % IV SOLN
INTRAVENOUS | Status: DC | PRN
  Administered 2021-11-20: 120 mL

## 2021-11-20 MED ORDER — SODIUM CHLORIDE (PF) 0.9 % IJ SOLN
INTRAMUSCULAR | Status: AC
  Filled 2021-11-20: qty 50

## 2021-11-20 MED ORDER — POVIDONE-IODINE 10 % EX SWAB: 2.0000 | Freq: Once | CUTANEOUS | Status: DC

## 2021-11-20 MED ORDER — TRANEXAMIC ACID-NACL 1000-0.7 MG/100ML-% IV SOLN: 1000.0000 mg | Freq: Once | INTRAVENOUS | Status: DC

## 2021-11-20 MED ORDER — HYDROMORPHONE HCL 1 MG/ML IJ SOLN: 0.2500 mg | INTRAMUSCULAR | Status: DC | PRN

## 2021-11-20 MED ORDER — PROPOFOL 10 MG/ML IV BOLUS
INTRAVENOUS | Status: DC | PRN
  Administered 2021-11-20: 50 mg via INTRAVENOUS
  Administered 2021-11-20: 20 mg via INTRAVENOUS
  Administered 2021-11-20 (×2): 50 mg via INTRAVENOUS
  Administered 2021-11-20: 20 mg via INTRAVENOUS

## 2021-11-20 MED ORDER — ORAL CARE MOUTH RINSE: 15.0000 mL | Freq: Once | OROMUCOSAL | Status: AC

## 2021-11-20 MED ORDER — 0.9 % SODIUM CHLORIDE (POUR BTL) OPTIME
TOPICAL | Status: DC | PRN
  Administered 2021-11-20: 1000 mL

## 2021-11-20 MED ORDER — OXYCODONE HCL 5 MG PO TABS: 5.0000 mg | ORAL_TABLET | Freq: Once | ORAL | Status: DC | PRN

## 2021-11-20 MED ORDER — LACTATED RINGERS IV BOLUS
250.0000 mL | Freq: Once | INTRAVENOUS | Status: AC
  Administered 2021-11-20: 250 mL via INTRAVENOUS

## 2021-11-20 MED ORDER — TRANEXAMIC ACID-NACL 1000-0.7 MG/100ML-% IV SOLN
1000.0000 mg | INTRAVENOUS | Status: AC
  Administered 2021-11-20: 1000 mg via INTRAVENOUS
  Filled 2021-11-20: qty 100

## 2021-11-20 MED ORDER — MIDAZOLAM HCL 2 MG/2ML IJ SOLN
1.0000 mg | Freq: Once | INTRAMUSCULAR | Status: DC
  Filled 2021-11-20: qty 2

## 2021-11-20 MED ORDER — FENTANYL CITRATE (PF) 100 MCG/2ML IJ SOLN
INTRAMUSCULAR | Status: AC
  Filled 2021-11-20: qty 2

## 2021-11-20 MED ORDER — FENTANYL CITRATE PF 50 MCG/ML IJ SOSY
50.0000 ug | PREFILLED_SYRINGE | Freq: Once | INTRAMUSCULAR | Status: AC
  Administered 2021-11-20: 50 ug via INTRAVENOUS
  Filled 2021-11-20: qty 2

## 2021-11-20 MED ORDER — OXYCODONE HCL 5 MG PO TABS: 5.0000 mg | ORAL_TABLET | ORAL | 0 refills | Status: AC | PRN

## 2021-11-20 MED ORDER — BUPIVACAINE-EPINEPHRINE (PF) 0.25% -1:200000 IJ SOLN
INTRAMUSCULAR | Status: AC
  Filled 2021-11-20: qty 30

## 2021-11-20 MED ORDER — DEXMEDETOMIDINE HCL IN NACL 80 MCG/20ML IV SOLN
INTRAVENOUS | Status: DC | PRN
  Administered 2021-11-20: 16 ug via BUCCAL

## 2021-11-20 MED ORDER — EPHEDRINE SULFATE-NACL 50-0.9 MG/10ML-% IV SOSY
PREFILLED_SYRINGE | INTRAVENOUS | Status: DC | PRN
  Administered 2021-11-20: 10 mg via INTRAVENOUS

## 2021-11-20 MED ORDER — BUPIVACAINE IN DEXTROSE 0.75-8.25 % IT SOLN
INTRATHECAL | Status: DC | PRN
  Administered 2021-11-20: 2 mL via INTRATHECAL

## 2021-11-20 MED ORDER — SODIUM CHLORIDE (PF) 0.9 % IJ SOLN
INTRAMUSCULAR | Status: AC
  Filled 2021-11-20: qty 20

## 2021-11-20 MED ORDER — ONDANSETRON HCL 4 MG/2ML IJ SOLN: 4.0000 mg | Freq: Once | INTRAMUSCULAR | Status: DC | PRN

## 2021-11-20 MED ORDER — CEFAZOLIN SODIUM-DEXTROSE 2-4 GM/100ML-% IV SOLN
2.0000 g | INTRAVENOUS | Status: AC
  Administered 2021-11-20: 2 g via INTRAVENOUS
  Filled 2021-11-20: qty 100

## 2021-11-20 SURGICAL SUPPLY — 51 items
ATTUNE MED DOME PAT 41 KNEE (Knees) IMPLANT
ATTUNE PS FEM RT SZ 7 CEM KNEE (Femur) IMPLANT
ATTUNE PSRP INSR SZ7 5 KNEE (Insert) IMPLANT
BAG COUNTER SPONGE SURGICOUNT (BAG) IMPLANT
BAG DECANTER FOR FLEXI CONT (MISCELLANEOUS) ×1 IMPLANT
BAG SPEC THK2 15X12 ZIP CLS (MISCELLANEOUS) ×1
BAG SPNG CNTER NS LX DISP (BAG)
BAG ZIPLOCK 12X15 (MISCELLANEOUS) ×1 IMPLANT
BASE TIBIAL ROT PLAT SZ 7 KNEE (Knees) IMPLANT
BLADE SAG 18X100X1.27 (BLADE) ×1 IMPLANT
BLADE SAW SGTL 11.0X1.19X90.0M (BLADE) ×1 IMPLANT
BLADE SURG SZ10 CARB STEEL (BLADE) ×2 IMPLANT
BNDG CMPR MED 10X6 ELC LF (GAUZE/BANDAGES/DRESSINGS) ×1
BNDG ELASTIC 6X10 VLCR STRL LF (GAUZE/BANDAGES/DRESSINGS) ×1 IMPLANT
BOWL SMART MIX CTS (DISPOSABLE) ×1 IMPLANT
BSPLAT TIB 7 CMNT ROT PLAT STR (Knees) ×1 IMPLANT
CEMENT HV SMART SET (Cement) ×2 IMPLANT
COVER SURGICAL LIGHT HANDLE (MISCELLANEOUS) ×1 IMPLANT
DRAPE INCISE IOBAN 66X45 STRL (DRAPES) ×2 IMPLANT
DRAPE U-SHAPE 47X51 STRL (DRAPES) ×2 IMPLANT
DRESSING AQUACEL AG SP 3.5X10 (GAUZE/BANDAGES/DRESSINGS) IMPLANT
DRSG AQUACEL AG ADV 3.5X10 (GAUZE/BANDAGES/DRESSINGS) ×1 IMPLANT
DRSG AQUACEL AG SP 3.5X10 (GAUZE/BANDAGES/DRESSINGS) ×1
DURAPREP 26ML APPLICATOR (WOUND CARE) ×1 IMPLANT
ELECT REM PT RETURN 15FT ADLT (MISCELLANEOUS) ×1 IMPLANT
GLOVE BIO SURGEON STRL SZ7.5 (GLOVE) ×1 IMPLANT
GLOVE BIO SURGEON STRL SZ8.5 (GLOVE) ×1 IMPLANT
GLOVE BIOGEL PI IND STRL 8 (GLOVE) ×2 IMPLANT
GLOVE BIOGEL PI IND STRL 9 (GLOVE) ×1 IMPLANT
GOWN STRL REUS W/ TWL XL LVL3 (GOWN DISPOSABLE) ×2 IMPLANT
GOWN STRL REUS W/TWL XL LVL3 (GOWN DISPOSABLE) ×2
HANDPIECE INTERPULSE COAX TIP (DISPOSABLE) ×1
HOOD PEEL AWAY FLYTE STAYCOOL (MISCELLANEOUS) ×6 IMPLANT
KIT TURNOVER KIT A (KITS) IMPLANT
NDL HYPO 21X1.5 SAFETY (NEEDLE) ×2 IMPLANT
NEEDLE HYPO 21X1.5 SAFETY (NEEDLE) ×2 IMPLANT
NS IRRIG 1000ML POUR BTL (IV SOLUTION) ×2 IMPLANT
PACK TOTAL KNEE CUSTOM (KITS) ×1 IMPLANT
PROTECTOR NERVE ULNAR (MISCELLANEOUS) ×1 IMPLANT
SET HNDPC FAN SPRY TIP SCT (DISPOSABLE) ×2 IMPLANT
SPIKE FLUID TRANSFER (MISCELLANEOUS) ×3 IMPLANT
SUT VIC AB 1 CTX 36 (SUTURE) ×1
SUT VIC AB 1 CTX36XBRD ANBCTR (SUTURE) ×1 IMPLANT
SUT VIC AB 3-0 CT1 27 (SUTURE) ×3
SUT VIC AB 3-0 CT1 TAPERPNT 27 (SUTURE) ×3 IMPLANT
SYR CONTROL 10ML LL (SYRINGE) ×2 IMPLANT
TIBIAL BASE ROT PLAT SZ 7 KNEE (Knees) ×1 IMPLANT
TRAY CATH INTERMITTENT SS 16FR (CATHETERS) IMPLANT
TRAY FOLEY MTR SLVR 16FR STAT (SET/KITS/TRAYS/PACK) ×1 IMPLANT
WATER STERILE IRR 1000ML POUR (IV SOLUTION) ×4 IMPLANT
WRAP KNEE MAXI GEL POST OP (GAUZE/BANDAGES/DRESSINGS) ×1 IMPLANT

## 2021-11-20 NOTE — Interval H&P Note (Signed)
History and Physical Interval Note:  11/20/2021 8:45 AM  Kyle Gully Sr.  has presented today for surgery, with the diagnosis of RIGHT KNEE OSTEOARTHRITIS.  The various methods of treatment have been discussed with the patient and family. After consideration of risks, benefits and other options for treatment, the patient has consented to  Procedure(s): RIGHT TOTAL KNEE ARTHROPLASTY (Right) as a surgical intervention.  The patient's history has been reviewed, patient examined, no change in status, stable for surgery.  I have reviewed the patient's chart and labs.  Questions were answered to the patient's satisfaction.     Kerin Salen

## 2021-11-20 NOTE — Discharge Instructions (Signed)

## 2021-11-20 NOTE — Transfer of Care (Signed)
Immediate Anesthesia Transfer of Care Note  Patient: Kyle Gully Sr.  Procedure(s) Performed: RIGHT TOTAL KNEE ARTHROPLASTY (Right: Knee)  Patient Location: PACU  Anesthesia Type:General  Level of Consciousness: awake, alert  and oriented  Airway & Oxygen Therapy: Patient Spontanous Breathing and Patient connected to face mask oxygen  Post-op Assessment: Report given to RN and Post -op Vital signs reviewed and stable  Post vital signs: Reviewed and stable  Last Vitals:  Vitals Value Taken Time  BP 156/109 11/20/21 1124  Temp 36.9 C 11/20/21 1124  Pulse 82 11/20/21 1127  Resp 16 11/20/21 1127  SpO2 99 % 11/20/21 1127  Vitals shown include unvalidated device data.  Last Pain:  Vitals:   11/20/21 0900  TempSrc:   PainSc: 0-No pain         Complications: No notable events documented.

## 2021-11-20 NOTE — Op Note (Signed)
PATIENT ID:      Kyle Gully Sr.  MRN:     [id number] DOB/AGE:    1940-09-07 / 81 y.o.       OPERATIVE REPORT   DATE OF PROCEDURE:  11/20/2021      PREOPERATIVE DIAGNOSIS:   RIGHT KNEE OSTEOARTHRITIS      Estimated body mass index is 29.1 kg/m as calculated from the following:   Height as of this encounter: 5' 10"  (1.778 m).   Weight as of this encounter: 92 kg.                                                       POSTOPERATIVE DIAGNOSIS:   Same                                                                  PROCEDURE:  Procedure(s): RIGHT TOTAL KNEE ARTHROPLASTY Using DepuyAttune RP implants #7R Femur, #7Tibia, 5 mm Attune RP bearing, 41 Patella    SURGEON: Kerin Salen  ASSISTANT:   Kerry Hough. [employer]   (Present and scrubbed throughout the case, critical for assistance with exposure, retraction, instrumentation, and closure.)        ANESTHESIA: Spinal, GLMA, 20cc Exparel, 50cc 0.25% Marcaine EBL: 300 cc FLUID REPLACEMENT: 1500 cc crystaloid TOURNIQUET: DRAINS: None TRANEXAMIC ACID: 1gm IV, 2gm topical COMPLICATIONS:  None         INDICATIONS FOR PROCEDURE: The patient has  RIGHT KNEE OSTEOARTHRITIS, Var deformities, XR shows bone on bone arthritis, lateral subluxation of tibia. Patient has failed all conservative measures including anti-inflammatory medicines, narcotics, attempts at exercise and weight loss, cortisone injections and viscosupplementation.  Risks and benefits of surgery have been discussed, questions answered.   DESCRIPTION OF PROCEDURE: The patient identified by armband, received  IV antibiotics, in the holding area at [facility]. Patient taken to the operating room, appropriate anesthetic monitors were attached, and Spinal, GLMA anesthesia was  induced. IV Tranexamic acid was given.Tourniquet applied high to the operative thigh. Lateral post and foot positioner applied to the table, the lower extremity was then prepped and draped in usual sterile  fashion from the toes to the tourniquet. Time-out procedure was performed. Kerry Hough. [facility] PAC, was present and scrubbed throughout the case, critical for assistance with, positioning, exposure, retraction, instrumentation, and closure.The skin and subcutaneous tissue along the incision was injected with 20 cc of a mixture of Exparel and Marcaine solution, using a 20-gauge by 1-1/2 inch needle. We began the operation, with the knee flexed 130 degrees, by making the anterior midline incision starting at handbreadth above the patella going over the patella 1 cm medial to and 4 cm distal to the tibial tubercle. Small bleeders in the skin and the subcutaneous tissue identified and cauterized. Transverse retinaculum was incised and reflected medially and a medial parapatellar arthrotomy was accomplished. the patella was everted and theprepatellar fat pad resected. The superficial medial collateral ligament was then elevated from anterior to posterior along the proximal flare of the tibia and anterior half of the menisci resected. The knee was hyperflexed exposing bone on  bone arthritis. Peripheral and notch osteophytes as well as the cruciate ligaments were then resected. We continued to work our way around posteriorly along the proximal tibia, and externally rotated the tibia subluxing it out from underneath the femur. A McHale PCL retractor was placed through the notch and a lateral Hohmann retractor placed, and we then entered the proximal tibia in line with the Depuy starter drill in line with the axis of the tibia followed by an intramedullary guide rod and 0-degree posterior slope cutting guide. The tibial cutting guide, 4 degree posterior sloped, was pinned into place allowing resection of 4 mm of bone medially and 11 mm of bone laterally. Satisfied with the tibial resection, we then entered the distal femur 2 mm anterior to the PCL origin with the intramedullary guide rod and applied the distal femoral cutting  guide set at 9 mm, with 5 degrees of valgus. This was pinned along the epicondylar axis. At this point, the distal femoral cut was accomplished without difficulty. We then sized for a #7R femoral component and pinned the guide in 3 degrees of external rotation. The chamfer cutting guide was pinned into place. The anterior, posterior, and chamfer cuts were accomplished without difficulty followed by the Attune RP box cutting guide and the box cut. We also removed posterior osteophytes from the posterior femoral condyles. The posterior capsule was injected with Exparel solution. The knee was brought into full extension. We checked our extension gap and fit a 5 mm bearing. Distracting in extension with a lamina spreader,  bleeders in the posterior capsule, Posterior medial and posterior lateral gutter were cauterized.  The transexamic acid-soaked sponge was then placed in the gap of the knee in extension. The knee was flexed 30. The posterior patella cut was accomplished with the 9.5 mm Attune cutting guide, sized for a 6m dome, and the fixation pegs drilled.The knee was then once again hyperflexed exposing the proximal tibia. We sized for a # 7 tibial base plate, applied the smokestack and the conical reamer followed by the the Delta fin keel punch. We then hammered into place the Attune RP trial femoral component, drilled the lugs, inserted a  5 mm trial bearing, trial patellar button, and took the knee through range of motion from 0-130 degrees. Medial and lateral ligamentous stability was checked. No thumb pressure was required for patellar Tracking. The tourniquet was not used. All trial components were removed, mating surfaces irrigated with pulse lavage, and dried with suction and sponges. 10 cc of the Exparel solution was applied to the cancellus bone of the patella distal femur and proximal tibia.  After waiting 30 seconds, the bony surfaces were again, dried with sponges. A double batch of DePuy HV cement  was mixed and applied to all bony metallic mating surfaces except for the posterior condyles of the femur itself. In order, we hammered into place the tibial tray and removed excess cement, the femoral component and removed excess cement. The final Attune RP bearing was inserted, and the knee brought to full extension with compression. The patellar button was clamped into place, and excess cement removed. The knee was held at 30 flexion with compression, while the cement cured. The wound was irrigated out with normal saline solution pulse lavage. The rest of the Exparel was injected into the parapatellar arthrotomy, subcutaneous tissues, and periosteal tissues. The parapatellar arthrotomy was closed with running #1 Vicryl suture. The subcutaneous tissue with 3-0 undyed Vicryl suture, and the skin with running 3-0 SQ vicryl.  An Aquacil and Ace wrap were applied. The patient was taken to recovery room without difficulty.   Kerin Salen 11/20/2021, 10:48 AM

## 2021-11-20 NOTE — Anesthesia Procedure Notes (Signed)
Procedure Name: LMA Insertion Date/Time: 11/20/2021 9:59 AM  Performed by: [place] Manus Rudd, CRNAPre-anesthesia Checklist: Patient identified, Emergency Drugs available, Suction available and Patient being monitored Patient Re-evaluated:Patient Re-evaluated prior to induction Oxygen Delivery Method: Circle system utilized Preoxygenation: Pre-oxygenation with 100% oxygen Induction Type: IV induction Ventilation: Mask ventilation without difficulty LMA: LMA inserted LMA Size: 4.0 Number of attempts: 1 Airway Equipment and Method: Bite block Placement Confirmation: positive ETCO2 Tube secured with: Tape Dental Injury: Teeth and Oropharynx as per pre-operative assessment

## 2021-11-20 NOTE — Anesthesia Procedure Notes (Signed)
Anesthesia Regional Block: Adductor canal block   Pre-Anesthetic Checklist: , timeout performed,  Correct Patient, Correct Site, Correct Laterality,  Correct Procedure, Correct Position, site marked,  Risks and benefits discussed,  Surgical consent,  Pre-op evaluation,  At surgeon's request and post-op pain management  Laterality: Right  Prep: Maximum Sterile Barrier Precautions used, chloraprep       Needles:  Injection technique: Single-shot  Needle Type: Echogenic Stimulator Needle     Needle Length: 9cm  Needle Gauge: 22     Additional Needles:   Procedures:,,,, ultrasound used (permanent image in chart),,    Narrative:  Start time: 11/20/2021 8:50 AM End time: 11/20/2021 8:55 AM Injection made incrementally with aspirations every 5 mL.  Performed by: Personally  Anesthesiologist: Pervis Hocking, DO  Additional Notes: Monitors applied. No increased pain on injection. No increased resistance to injection. Injection made in 5cc increments. Good needle visualization. Patient tolerated procedure well.

## 2021-11-20 NOTE — Evaluation (Addendum)
Physical Therapy Evaluation Patient Details Name: Kyle STANKOVICH Sr. MRN: [id number] DOB: [birth year] Today's Date: 11/20/2021  History of Present Illness  81 yo male s/p R TKA 11/20/21. Hx of COPD, myasthenia gravis, aortic aneurysm  Clinical Impression  On eval POD 0 in PACU, pt was unable to safely stand at bedside. RN reported pt was "ready" and that he denied numbness. On assessment, pt was able to perform SLR, LAQ, and ankle pumps. However, upon attempting to stand with RW, pt was unable-likely due to block not being worn off yet. He also had a urinary incontinence episode of which he was unaware. Assisted pt back to bed. Made RN aware of events. Will check back later today to see if pt performance has improved. Also, pt had an elevated BP during session: 166/105.      Recommendations for follow up therapy are one component of a multi-disciplinary discharge planning process, led by the attending physician.  Recommendations may be updated based on patient status, additional functional criteria and insurance authorization.  Follow Up Recommendations Follow physician's recommendations for discharge plan and follow up therapies      Assistance Recommended at Discharge Frequent or constant Supervision/Assistance  Patient can return home with the following  A little help with walking and/or transfers;A little help with bathing/dressing/bathroom;Help with stairs or ramp for entrance;Assistance with cooking/housework;Assist for transportation    Equipment Recommendations None recommended by PT  Recommendations for Other Services       Functional Status Assessment Patient has had a recent decline in their functional status and demonstrates the ability to make significant improvements in function in a reasonable and predictable amount of time.     Precautions / Restrictions Precautions Precautions: Fall Restrictions Weight Bearing Restrictions: No      Mobility  Bed Mobility Overal  bed mobility: Needs Assistance Bed Mobility: Supine to Sit     Supine to sit: Min guard, HOB elevated     General bed mobility comments: Min guard for safety. Increased time. Cues provided. BP 166/105    Transfers Overall transfer level: Needs assistance Equipment used: Rolling walker (2 wheels) Transfers: Sit to/from Stand Sit to Stand: From elevated surface, Mod assist           General transfer comment: Pt impulsively stood on 1st attempt-significant difficulty-unable to safely perform. Stood on 2nd attempt with cues for safety, technique, hand/LE placement-Mod A to rise-pt unable to safely stand due to block not yet worn off (although pt denied numbness). Urinary incontinence occurred during 2nd standing attempt-pt unaware. Deferred further standing attempts and assisted pt back to bed for safety reasons.    Ambulation/Gait                  Stairs            Wheelchair Mobility    Modified Rankin (Stroke Patients Only)       Balance Overall balance assessment: Needs assistance Sitting-balance support: Bilateral upper extremity supported, Feet supported Sitting balance-Leahy Scale: Fair     Standing balance support: Bilateral upper extremity supported, During functional activity, Reliant on assistive device for balance Standing balance-Leahy Scale: Poor                               Pertinent Vitals/Pain Pain Assessment Pain Assessment: 0-10 Pain Score: 2  Pain Location: R knee Pain Descriptors / Indicators: Discomfort, Sore Pain Intervention(s): Limited activity within patient's tolerance, Monitored during  session, Repositioned    Home Living Family/patient expects to be discharged to:: Private residence Living Arrangements: Spouse/significant other Available Help at Discharge: Family Type of Home: House Home Access: Stairs to enter Entrance Stairs-Rails: Psychiatric nurse of Steps: [place]: One  level Home Equipment: Conservation officer, nature (2 wheels)      Prior Function Prior Level of Function : Independent/Modified Independent             Mobility Comments: independent       Hand Dominance        Extremity/Trunk Assessment   Upper Extremity Assessment Upper Extremity Assessment: Overall WFL for tasks assessed    Lower Extremity Assessment Lower Extremity Assessment:  (R LE: able to SLR, moves ankle well)    Cervical / Trunk Assessment Cervical / Trunk Assessment: Normal  Communication   Communication: No difficulties  Cognition Arousal/Alertness: Awake/alert Behavior During Therapy: WFL for tasks assessed/performed Overall Cognitive Status: Within Functional Limits for tasks assessed                                          General Comments      Exercises     Assessment/Plan    PT Assessment Patient needs continued PT services  PT Problem List Decreased strength;Decreased mobility;Decreased range of motion;Decreased activity tolerance;Decreased balance;Decreased knowledge of use of DME;Pain       PT Treatment Interventions DME instruction;Therapeutic activities;Gait training;Therapeutic exercise;Functional mobility training;Balance training;Patient/family education    PT Goals (Current goals can be found in the Care Plan section)  Acute Rehab PT Goals Patient Stated Goal: home today PT Goal Formulation: With patient/family Time For Goal Achievement: 12/04/21 Potential to Achieve Goals: Good    Frequency 7X/week     Co-evaluation               AM-PAC PT "6 Clicks" Mobility  Outcome Measure Help needed turning from your back to your side while in a flat bed without using bedrails?: A Little Help needed moving from lying on your back to sitting on the side of a flat bed without using bedrails?: A Little Help needed moving to and from a bed to a chair (including a wheelchair)?: A Lot Help needed standing up from a chair  using your arms (e.g., wheelchair or bedside chair)?: A Lot Help needed to walk in hospital room?: A Lot Help needed climbing 3-5 steps with a railing? : Total 6 Click Score: 13    End of Session Equipment Utilized During Treatment: Gait belt Activity Tolerance: Patient tolerated treatment well Patient left: in bed;with call bell/phone within reach;with family/visitor present        Time: 1353-1405 PT Time Calculation (min) (ACUTE ONLY): 12 min   Charges:   PT Evaluation $PT Eval Low Complexity: [place], PT Acute Rehabilitation  Office: [phone] Pager: [phone]

## 2021-11-20 NOTE — Anesthesia Procedure Notes (Addendum)
Spinal  Patient location during procedure: OR Start time: 11/20/2021 9:36 AM End time: 11/20/2021 9:38 AM Reason for block: surgical anesthesia Staffing Performed: resident/CRNA  Resident/CRNA: [place] Tesneem Dufrane C, CRNA Performed by: [place] Manus Rudd, CRNA Authorized by: Pervis Hocking, DO   Preanesthetic Checklist Completed: patient identified, IV checked, site marked, risks and benefits discussed, surgical consent, monitors and equipment checked, pre-op evaluation and timeout performed Spinal Block Patient position: sitting Prep: DuraPrep and site prepped and draped Patient monitoring: heart rate, cardiac monitor, continuous pulse ox and blood pressure Approach: midline Location: L3-4 Injection technique: single-shot Needle Needle type: Pencan  Needle gauge: 24 G Needle length: 9 cm Assessment Sensory level: T4 Events: CSF return Additional Notes IV functioning, monitors applied to pt. Expiration date of kit checked and confirmed to be in date. Sterile prep and drape, hand hygiene and sterile gloved used. Pt was positioned and spine was prepped in sterile fashion. Skin was anesthetized with lidocaine. Free flow of clear CSF obtained prior to injecting local anesthetic into CSF x 1 attempt. Spinal needle aspirated freely following injection. Needle was carefully withdrawn, and pt tolerated procedure well. Loss of motor and sensory on exam post injection.

## 2021-11-20 NOTE — Progress Notes (Signed)
Physical Therapy Treatment Patient Details Name: Kyle PRIOLA Sr. MRN: [id number] DOB: 1940-09-01 Today's Date: 11/20/2021   History of Present Illness 81 yo male s/p R TKA 11/20/21. Hx of COPD, myasthenia gravis, aortic aneurysm    PT Comments    2nd session in PACU. Pt walked ~75 feet with a RW and ascended/descended portable stairs. Issued and reviewed HEP. Minimal pain with activity.  All education completed.   Recommendations for follow up therapy are one component of a multi-disciplinary discharge planning process, led by the attending physician.  Recommendations may be updated based on patient status, additional functional criteria and insurance authorization.  Follow Up Recommendations  Follow physician's recommendations for discharge plan and follow up therapies     Assistance Recommended at Discharge Frequent or constant Supervision/Assistance  Patient can return home with the following A little help with walking and/or transfers;A little help with bathing/dressing/bathroom;Help with stairs or ramp for entrance;Assistance with cooking/housework;Assist for transportation   Equipment Recommendations  None recommended by PT    Recommendations for Other Services       Precautions / Restrictions Precautions Precautions: Fall Restrictions Weight Bearing Restrictions: No Other Position/Activity Restrictions: WBAT     Mobility  Bed Mobility Overal bed mobility: Needs Assistance Bed Mobility: Supine to Sit     Supine to sit: Supervision, HOB elevated     General bed mobility comments: Supv for safety    Transfers Overall transfer level: Needs assistance Equipment used: Rolling walker (2 wheels) Transfers: Sit to/from Stand Sit to Stand: Min guard           General transfer comment: Min guard for safety. Cues for safety, hand/LE placement.    Ambulation/Gait Ambulation/Gait assistance: Min guard Gait Distance (Feet): 75 Feet Assistive device: Rolling  walker (2 wheels) Gait Pattern/deviations: Step-to pattern       General Gait Details: Min guard for safety. Cues for safety, sequence, pacing and for pt to exercise caution when ambulating (don't rush and to use step to pattern for safety)   Stairs Stairs: Yes Stairs assistance: Min guard Stair Management: Step to pattern, Forwards, Two rails Number of Stairs: 2 General stair comments: Cues for safety, technique, sequence. Min guard for safety.   Wheelchair Mobility    Modified Rankin (Stroke Patients Only)       Balance Overall balance assessment: Needs assistance Sitting-balance support: Bilateral upper extremity supported, Feet supported Sitting balance-Leahy Scale: Fair     Standing balance support: Bilateral upper extremity supported, During functional activity, Reliant on assistive device for balance Standing balance-Leahy Scale: Fair                              Cognition Arousal/Alertness: Awake/alert Behavior During Therapy: WFL for tasks assessed/performed Overall Cognitive Status: Within Functional Limits for tasks assessed                                          Exercises Total Joint Exercises Ankle Circles/Pumps: AROM, Both, 10 reps Quad Sets: AROM, Both, 10 reps Heel Slides: AROM, Right, 10 reps Hip ABduction/ADduction: AROM, Right Straight Leg Raises: AROM, Right, 10 reps    General Comments        Pertinent Vitals/Pain Pain Assessment Pain Assessment: Faces Pain Score: 2  Faces Pain Scale: Hurts a little bit Pain Location: R knee Pain Descriptors / Indicators: Discomfort,  Sore Pain Intervention(s): Monitored during session, Repositioned    Home Living Family/patient expects to be discharged to:: Private residence Living Arrangements: Spouse/significant other Available Help at Discharge: Family Type of Home: House Home Access: Stairs to enter Entrance Stairs-Rails: Psychiatric nurse of  Steps: [place]: One level Home Equipment: Conservation officer, nature (2 wheels)      Prior Function            PT Goals (current goals can now be found in the care plan section) Acute Rehab PT Goals Patient Stated Goal: home today PT Goal Formulation: With patient/family Time For Goal Achievement: 12/04/21 Potential to Achieve Goals: Good Progress towards PT goals: Progressing toward goals    Frequency    7X/week      PT Plan Current plan remains appropriate    Co-evaluation              AM-PAC PT "6 Clicks" Mobility   Outcome Measure  Help needed turning from your back to your side while in a flat bed without using bedrails?: A Little Help needed moving from lying on your back to sitting on the side of a flat bed without using bedrails?: A Little Help needed moving to and from a bed to a chair (including a wheelchair)?: A Little Help needed standing up from a chair using your arms (e.g., wheelchair or bedside chair)?: A Little Help needed to walk in hospital room?: A Little Help needed climbing 3-5 steps with a railing? : A Little 6 Click Score: 18    End of Session Equipment Utilized During Treatment: Gait belt Activity Tolerance: Patient tolerated treatment well Patient left: in chair;with call bell/phone within reach;with family/visitor present   PT Visit Diagnosis: Pain;Other abnormalities of gait and mobility (R26.89) Pain - Right/Left: Right Pain - part of body: Knee     Time: 8453-6468 PT Time Calculation (min) (ACUTE ONLY): 18 min  Charges:                            Doreatha Massed, PT Acute Rehabilitation  Office: [phone] Pager: [phone]

## 2021-11-20 NOTE — Anesthesia Postprocedure Evaluation (Signed)
Anesthesia Post Note  Patient: Kyle DOME Sr.  Procedure(s) Performed: RIGHT TOTAL KNEE ARTHROPLASTY (Right: Knee)     Patient location during evaluation: PACU Anesthesia Type: Regional, Spinal, MAC and General Level of consciousness: awake and alert and oriented Pain management: pain level controlled Vital Signs Assessment: post-procedure vital signs reviewed and stable Respiratory status: spontaneous breathing, nonlabored ventilation and respiratory function stable Cardiovascular status: blood pressure returned to baseline and stable Postop Assessment: no headache, no backache, spinal receding and patient able to bend at knees Anesthetic complications: no Comments: Failed spinal, converted to GA    No notable events documented.  Last Vitals:  Vitals:   11/20/21 1200 11/20/21 1206  BP: (!) 142/87 (!) 137/94  Pulse: 77 77  Resp: 19 18  Temp: 36.7 C   SpO2: 93% 93%    Last Pain:  Vitals:   11/20/21 1200  TempSrc:   PainSc: 0-No pain                 Pervis Hocking

## 2021-11-20 NOTE — Progress Notes (Signed)
Orthopedic Tech Progress Note Patient Details:  Kyle DOLPH Sr. 05-Jan-1941 [id number]  Ortho Devices Type of Ortho Device: Bone foam zero knee Ortho Device/Splint Interventions: Application   Post Interventions Patient Tolerated: Well Instructions Provided: Care of device  [name] 11/20/2021, 11:30 AM

## 2021-11-21 ENCOUNTER — Encounter (HOSPITAL_COMMUNITY): Payer: Self-pay | Admitting: Orthopedic Surgery

## 2021-11-27 ENCOUNTER — Ambulatory Visit: Payer: Medicare Other | Attending: Orthopedic Surgery

## 2021-11-27 DIAGNOSIS — M25661 Stiffness of right knee, not elsewhere classified: Secondary | ICD-10-CM | POA: Insufficient documentation

## 2021-11-27 DIAGNOSIS — G8929 Other chronic pain: Secondary | ICD-10-CM | POA: Diagnosis present

## 2021-11-27 DIAGNOSIS — M25561 Pain in right knee: Secondary | ICD-10-CM | POA: Diagnosis present

## 2021-11-27 DIAGNOSIS — R262 Difficulty in walking, not elsewhere classified: Secondary | ICD-10-CM | POA: Insufficient documentation

## 2021-11-27 NOTE — Therapy (Signed)
[facility] [address], [place], [address] Phone: [phone]   Fax:  [phone]  Physical Therapy Evaluation  Patient Details  Name: Kyle SIEGEL Sr. MRN: [id number] Date of Birth: 1940-04-21 Referring Provider (PT): Frederik Pear, MD   Encounter Date: 11/27/2021   PT End of Session - 11/27/21 0936     Visit Number 1    Number of Visits 17    Date for PT Re-Evaluation 01/25/22    Authorization Type 1    Authorization Time Period 10 progress report    PT Start Time 0937    PT Stop Time 1013    PT Time Calculation (min) 36 min    Activity Tolerance Patient tolerated treatment well    Behavior During Therapy [facility] for tasks assessed/performed             Past Medical History:  Diagnosis Date   Arthritis    COPD (chronic obstructive pulmonary disease) ([place])    Crohn's disease ([place])    Emphysema, unspecified ([place])    Hypertension    Myasthenia gravis ([place])     Past Surgical History:  Procedure Laterality Date   JOINT REPLACEMENT Left    total knee replacement    Dr. Marry Guan   TONSILLECTOMY     TOTAL KNEE ARTHROPLASTY Right 11/20/2021   Procedure: RIGHT TOTAL KNEE ARTHROPLASTY;  Surgeon: Frederik Pear, MD;  Location: [facility];  Service: Orthopedics;  Laterality: Right;   WISDOM TOOTH EXTRACTION      There were no vitals filed for this visit.    Subjective Assessment - 11/27/21 0946     Subjective R knee pain: 3/10 currenty (pt sitting on a chair, 3/10 when walking).    Pertinent History S/P R TKA 11/20/2021. Had home health PT for 4 days. PLOF: independent ambulation but with R knee pain. Has been active all his life. Next follow up appointment with Surgeon is this Thursday 11/30/2021 at 10:30 am. 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    Patient Stated Goals Return to normal daily funciton.    Currently in Pain? Yes    Pain Score 3     Pain Location Knee    Pain Orientation Right    Pain Type  Chronic pain;Acute pain;Surgical pain    Pain Onset More than a month ago    Pain Frequency Occasional                [facility] PT Assessment - 11/27/21 0942       Assessment   Medical Diagnosis S/P R TKA    Referring Provider (PT) Frederik Pear, MD    Onset Date/Surgical Date 11/20/21    Next MD Visit 11/30/2021    Prior Therapy Home health PT      Precautions   Precaution Comments HTN      Restrictions   Other Position/Activity Restrictions no known restrictions      Balance Screen   Has the patient fallen in the past 6 months No    Has the patient had a decrease in activity level because of a fear of falling?  No    Is the patient reluctant to leave their home because of a fear of falling?  No      Home Environment   Additional Comments Lives in a 1 story home with wife. 5 steps at garage door, B rail.      Prior Function   Vocation Retired  Posture/Postural Control   Posture Comments decreased R LE weight bearing, decreased R knee extension, R hip in flexion, R lateral shift      AROM   Right Knee Extension -19    Right Knee Flexion 92   95 degrees AAROM   Left Knee Extension -12    Left Knee Flexion 124      Strength   Right Hip Flexion 3-/5    Right Hip Extension 4/5   seated manually resisted   Right Hip ABduction 4/5   seated manually resisted   Left Hip Flexion 4-/5    Left Hip Extension 4/5   seated manually resisted   Left Hip ABduction 4/5   seated manually resisted   Right Knee Flexion 3+/5    Right Knee Extension 4/5    Left Knee Flexion 5/5    Left Knee Extension 5/5      Ambulation/Gait   Gait Comments with rw, forward flexed, decreased B hip extension, decreased stance R LE, antalgic, R knee in flexion during stance phase, decreased R knee flexion during swing phase, decreased gait speed.                        Objective measurements completed on examination: See above findings.    No latex allergies. Blood pressure is  not controlled.  Start of session: Blood pressure L arm sitting, mechanically taken, normal cuff: 172/93, HR 77 (MAP 115). Asymptomatic  After manual muscle testing: (mid session) 180/97, HR 78 (MAP 119). Asymptomatic  Pt was recommended to contact MD due to elevated levels. Pt verbalized understanding.          Therapeutic exercise   Seated R knee extension stretch, foot propped up on stool 1 minute x 2   Seated R ankle PF/DF with knee flexed to promote blood flow and gentle knee flexion ROM 10x2   Standing with B UE assist   TKE R, no resistance, 10x3 to promote knee extension ROM    Improved exercise technique, movement at target joints, use of target muscles after mod verbal, visual, tactile cues.     Response to treatment Pt tolerated session well without aggravation of symptoms.     Clinical impression Pt is an 81 year old male who came to physical therapy S/P R TKA on 11/20/2021. Pt is currently 1 week post op. He also presents with altered gait pattern and posture, R LE swelling, limited R knee flexion and extension AROM, R LE weakness, and difficulty performing tasks such as ambulation, transfers, and stair negotiation secondary to pain, limited ROM, and decreased strength. Pt will benefit from skilled physical therapy services to address the aforementioned deficits.                     PT Education - 11/27/21 1137     Education Details Ther-ex, POC    Person(s) Educated Patient    Methods Explanation;Demonstration;Tactile cues;Verbal cues    Comprehension Verbalized understanding;Returned demonstration              PT Short Term Goals - 11/27/21 1128       PT SHORT TERM GOAL #1   Title Pt will be independent with his initial HEP to improve ROM, strength and ability to ambulate with less difficulty.    Time 3    Period Weeks    Status New    Target Date 12/21/21  PT Long Term Goals - 11/27/21 1129       PT LONG  TERM GOAL #1   Title Pt will improve R knee extension AROM to -5 degrees or more and R knee flexion AROM to 120 degrees or more to improve ability to ambulate as well as negotiate stairs with less difficulty.    Baseline Seated R knee AROM: -19 degrees extension, 92 degrees flexion (11/27/2021)    Time 8    Period Weeks    Status New    Target Date 01/25/22      PT LONG TERM GOAL #2   Title Pt will improve R LE strength by at least 1/2 MMT grade to promote ability to perform standing tasks, ambulate, and negotiate stairs with less difficulty.    Baseline R LE: 3-/5 hip flexion, 4/5 hip extension (seated manually resisted), 4/5 hip abduction (seated manually resisted), 3+/5 knee flexion, 4/5 knee extension (11/27/2021)    Time 8    Period Weeks    Status New    Target Date 01/25/22      PT LONG TERM GOAL #3   Title Pt will be able to ambulate without AD at least 500 ft, independently to promote mobility.    Baseline PT currently ambulates with rw (11/27/2021)    Time 8    Period Weeks    Status New    Target Date 01/25/22      PT LONG TERM GOAL #4   Title Pt will improve his knee FOTO score by at least 10 points as a demonstration of improved function.    Baseline R knee FOTO 38 (11/27/2021)    Time 8    Period Weeks    Status New    Target Date 01/25/22                    Plan - 11/27/21 1122     Clinical Impression Statement Pt is an 81 year old male who came to physical therapy S/P R TKA on 11/20/2021. Pt is currently 1 week post op. He also presents with altered gait pattern and posture, R LE swelling, limited R knee flexion and extension AROM, R LE weakness, and difficulty performing tasks such as ambulation, transfers, and stair negotiation secondary to pain, limited ROM, and decreased strength. Pt will benefit from skilled physical therapy services to address the aforementioned deficits.    Personal Factors and Comorbidities Age;Comorbidity 3+;Fitness;Past/Current  Experience;Time since onset of injury/illness/exacerbation    Comorbidities HTN, arthritis, COPD    Examination-Activity Limitations Bathing;Stairs;Stand;Lift;Transfers;Carry;Locomotion Level;Squat    Stability/Clinical Decision Making Stable/Uncomplicated    Clinical Decision Making Low    Rehab Potential Fair    PT Frequency 2x / week    PT Duration 8 weeks    PT Treatment/Interventions Therapeutic activities;Therapeutic exercise;Functional mobility training;Balance training;Neuromuscular re-education;Patient/family education;Manual techniques;Aquatic Therapy;Occupational psychologist    PT Next Visit Plan Knee ROM, R LE strengthening, gait training, balance, manual techniques, modalities PRN    Consulted and Agree with Plan of Care Patient             Patient will benefit from skilled therapeutic intervention in order to improve the following deficits and impairments:  Pain, Postural dysfunction, Improper body mechanics, Difficulty walking, Decreased strength, Decreased range of motion, Decreased balance, Abnormal gait, Decreased activity tolerance  Visit Diagnosis: Chronic pain of right knee - Plan: PT plan of care cert/re-cert  Difficulty in walking, not elsewhere classified - Plan: PT plan of care  cert/re-cert  Stiffness of right knee, not elsewhere classified - Plan: PT plan of care cert/re-cert     Problem List Patient Active Problem List   Diagnosis Date Noted   Osteoarthritis of right knee 11/15/2021   Benign essential hypertension 01/07/2018   Crohn's colitis ([place]) 01/07/2018   Hyperlipidemia LDL goal <130 01/07/2018   Myasthenia ([place]) 12/28/2017   Benign non-nodular prostatic hyperplasia with lower urinary tract symptoms 12/28/2013   STRICTURE AND STENOSIS OF ESOPHAGUS 10/20/2007   ACID REFLUX DISEASE 10/20/2007   HIATAL HERNIA 10/20/2007   CROHN'S DISEASE 10/20/2007   DIVERTICULOSIS OF COLON 10/20/2007   SEBORRHEIC DERMATITIS  10/20/2007    Joneen Boers PT, DPT  11/27/2021, 11:46 AM  [facility] [address], [place], [address] Phone: [phone]   Fax:  [phone]  Name: Kyle DRAHEIM Sr. MRN: [id number] Date of Birth: 1940-05-17

## 2021-11-29 ENCOUNTER — Ambulatory Visit: Payer: Medicare Other

## 2021-11-29 DIAGNOSIS — G8929 Other chronic pain: Secondary | ICD-10-CM

## 2021-11-29 DIAGNOSIS — M25561 Pain in right knee: Secondary | ICD-10-CM | POA: Diagnosis not present

## 2021-11-29 DIAGNOSIS — R262 Difficulty in walking, not elsewhere classified: Secondary | ICD-10-CM

## 2021-11-29 DIAGNOSIS — M25661 Stiffness of right knee, not elsewhere classified: Secondary | ICD-10-CM

## 2021-11-29 NOTE — Therapy (Signed)
OUTPATIENT PHYSICAL THERAPY TREATMENT NOTE   Patient Name: Kyle SHREVE Sr. MRN: [id number] DOB:01-26-1941, 81 y.o., male Today's Date: 11/29/2021  PCP: Derinda Late, MD  REFERRING PROVIDER: Frederik Pear, MD   PT End of Session - 11/29/21 0849     Visit Number 2    Number of Visits 17    Date for PT Re-Evaluation 01/25/22    Authorization Type 2    Authorization Time Period 10 progress report    PT Start Time 0849    PT Stop Time 0933    PT Time Calculation (min) 44 min    Activity Tolerance Patient tolerated treatment well    Behavior During Therapy [facility] for tasks assessed/performed             Past Medical History:  Diagnosis Date   Arthritis    COPD (chronic obstructive pulmonary disease) ([place])    Crohn's disease ([place])    Emphysema, unspecified ([place])    Hypertension    Myasthenia gravis ([place])    Past Surgical History:  Procedure Laterality Date   JOINT REPLACEMENT Left    total knee replacement    Dr. Marry Guan   TONSILLECTOMY     TOTAL KNEE ARTHROPLASTY Right 11/20/2021   Procedure: RIGHT TOTAL KNEE ARTHROPLASTY;  Surgeon: Frederik Pear, MD;  [facility]: [facility];  Service: Orthopedics;  Laterality: Right;   WISDOM TOOTH EXTRACTION     Patient Active Problem List   Diagnosis Date Noted   Osteoarthritis of right knee 11/15/2021   Benign essential hypertension 01/07/2018   Crohn's colitis ([place]) 01/07/2018   Hyperlipidemia LDL goal <130 01/07/2018   Myasthenia ([place]) 12/28/2017   Benign non-nodular prostatic hyperplasia with lower urinary tract symptoms 12/28/2013   STRICTURE AND STENOSIS OF ESOPHAGUS 10/20/2007   ACID REFLUX DISEASE 10/20/2007   HIATAL HERNIA 10/20/2007   CROHN'S DISEASE 10/20/2007   DIVERTICULOSIS OF COLON 10/20/2007   SEBORRHEIC DERMATITIS 10/20/2007    REFERRING DIAG: R TKA 11/20/2021  THERAPY DIAG:  Chronic pain of right knee  Difficulty in walking, not elsewhere classified  Stiffness of right knee, not elsewhere  classified  Rationale for Evaluation and Treatment Rehabilitation  PERTINENT HISTORY: S/P R TKA 11/20/2021. Had home health PT for 4 days. PLOF: independent ambulation but with R knee pain. Has been active all his life. Next follow up appointment with Surgeon is this Thursday 11/30/2021 at 10:30 am. Plays guitar at church  PRECAUTIONS: HTN  SUBJECTIVE:   SUBJECTIVE STATEMENT: Doctor put him on atenolol for his blood pressure, took it about 7:30 am this morning. 3/10 R knee pain currently. Has not taken pain medicine  PAIN:  Are you having pain? See subjective   TODAY'S TREATMENT:                                                                                                                              DATE: 11/29/2021           Therapeutic  exercise              Start of session: Blood pressure L arm sitting, mechanically taken, normal cuff: 162 /95, HR 56 (MAP 111). Asymptomatic  Seated R knee extension stretch, foot propped up on stool 2 minutes   Then with quad set 10x5 seconds for 3 sets to promote knee extension    - 18 degrees knee extension in quad set position   Seated R knee flexion stretch 30 seconds x 5   Seated R knee flexion AAROM 90 degrees   Seated Blood pressure, L arm sitting, mechanically taken, normal cuff: 148/84, HR 55 (MAP 100) (blood pressure medicine may be starting to work)  Seated R knee AAROM with PT 10x3   95 degrees seated R knee flexion AAROM   Standing R TKE with B UE assist 10x2 with 5 second holds     Improved exercise technique, movement at target joints, use of target muscles after mod verbal, visual, tactile cues.      Manual therapy Seated with foot propped on stool  STM medial and lateral hamstrings to decrease tension    Sitting with knee in flexion    Gentle STM medial, and lateral knee to decrease fascial restrictions       Response to treatment Pt tolerated session well without aggravation of symptoms.         Clinical impression Worked on gentle STM to decrease hamstring muscle tension and improve knee fascial mobility to promote ROM. Also worked on knee flexion and extension AAROM to decrease stiffness and decrease difficulty with gait. Pt tolerated session well without aggravation of symptoms. Pt will benefit from continued skilled physical therapy services to decrease pain, improve ROM, strength and function.    PATIENT EDUCATION: Education details: there-ex Person educated: Patient Education method: Consulting civil engineer, Demonstration, Tactile cues, and Verbal cues Education comprehension: verbalized understanding and returned demonstration  HOME EXERCISE PROGRAM:         PT Short Term Goals - 11/27/21 1128       PT SHORT TERM GOAL #1   Title Pt will be independent with his initial HEP to improve ROM, strength and ability to ambulate with less difficulty.    Time 3    Period Weeks    Status New    Target Date 12/21/21              PT Long Term Goals - 11/27/21 1129       PT LONG TERM GOAL #1   Title Pt will improve R knee extension AROM to -5 degrees or more and R knee flexion AROM to 120 degrees or more to improve ability to ambulate as well as negotiate stairs with less difficulty.    Baseline Seated R knee AROM: -19 degrees extension, 92 degrees flexion (11/27/2021)    Time 8    Period Weeks    Status New    Target Date 01/25/22      PT LONG TERM GOAL #2   Title Pt will improve R LE strength by at least 1/2 MMT grade to promote ability to perform standing tasks, ambulate, and negotiate stairs with less difficulty.    Baseline R LE: 3-/5 hip flexion, 4/5 hip extension (seated manually resisted), 4/5 hip abduction (seated manually resisted), 3+/5 knee flexion, 4/5 knee extension (11/27/2021)    Time 8    Period Weeks    Status New    Target Date 01/25/22      PT LONG TERM GOAL #  3   Title Pt will be able to ambulate without AD at least 500 ft, independently to promote  mobility.    Baseline PT currently ambulates with rw (11/27/2021)    Time 8    Period Weeks    Status New    Target Date 01/25/22      PT LONG TERM GOAL #4   Title Pt will improve his knee FOTO score by at least 10 points as a demonstration of improved function.    Baseline R knee FOTO 38 (11/27/2021)    Time 8    Period Weeks    Status New    Target Date 01/25/22              Plan - 11/29/21 0847     Clinical Impression Statement Worked on gentle STM to decrease hamstring muscle tension and improve knee fascial mobility to promote ROM. Also worked on knee flexion and extension AAROM to decrease stiffness and decrease difficulty with gait. Pt tolerated session well without aggravation of symptoms. Pt will benefit from continued skilled physical therapy services to decrease pain, improve ROM, strength and function.    Personal Factors and Comorbidities Age;Comorbidity 3+;Fitness;Past/Current Experience;Time since onset of injury/illness/exacerbation    Comorbidities HTN, arthritis, COPD    Examination-Activity Limitations Bathing;Stairs;Stand;Lift;Transfers;Carry;Locomotion Level;Squat    Stability/Clinical Decision Making Stable/Uncomplicated    Clinical Decision Making Low    Rehab Potential Fair    PT Frequency 2x / week    PT Duration 8 weeks    PT Treatment/Interventions Therapeutic activities;Therapeutic exercise;Functional mobility training;Balance training;Neuromuscular re-education;Patient/family education;Manual techniques;Aquatic Therapy;Electrical Stimulation;Gait training;Stair training    PT Next Visit Plan Knee ROM, R LE strengthening, gait training, balance, manual techniques, modalities PRN    Consulted and Agree with Plan of Care Patient              Joneen Boers PT, DPT  11/29/2021, 9:47 AM

## 2021-12-04 ENCOUNTER — Ambulatory Visit: Payer: Medicare Other

## 2021-12-04 DIAGNOSIS — M25561 Pain in right knee: Secondary | ICD-10-CM | POA: Diagnosis not present

## 2021-12-04 DIAGNOSIS — R262 Difficulty in walking, not elsewhere classified: Secondary | ICD-10-CM

## 2021-12-04 DIAGNOSIS — G8929 Other chronic pain: Secondary | ICD-10-CM

## 2021-12-04 DIAGNOSIS — M25661 Stiffness of right knee, not elsewhere classified: Secondary | ICD-10-CM

## 2021-12-04 NOTE — Therapy (Signed)
OUTPATIENT PHYSICAL THERAPY TREATMENT NOTE   Patient Name: Kyle BAIR Sr. MRN: [id number] DOB:[birth year], 81 y.o., male Today's Date: 12/04/2021  PCP: Derinda Late, MD  REFERRING PROVIDER: Frederik Pear, MD   PT End of Session - 12/04/21 0805     Visit Number 3    Number of Visits 17    Date for PT Re-Evaluation 01/25/22    Authorization Type 3    Authorization Time Period 10 progress report    PT Start Time 0805    PT Stop Time 0848    PT Time Calculation (min) 43 min    Activity Tolerance Patient tolerated treatment well    Behavior During Therapy WFL for tasks assessed/performed              Past Medical History:  Diagnosis Date   Arthritis    COPD (chronic obstructive pulmonary disease) ([place])    Crohn's disease ([place])    Emphysema, unspecified ([place])    Hypertension    Myasthenia gravis ([place])    Past Surgical History:  Procedure Laterality Date   JOINT REPLACEMENT Left    total knee replacement    Dr. Marry Guan   TONSILLECTOMY     TOTAL KNEE ARTHROPLASTY Right 11/20/2021   Procedure: RIGHT TOTAL KNEE ARTHROPLASTY;  Surgeon: Frederik Pear, MD;  Location: [facility];  Service: Orthopedics;  Laterality: Right;   WISDOM TOOTH EXTRACTION     Patient Active Problem List   Diagnosis Date Noted   Osteoarthritis of right knee 11/15/2021   Benign essential hypertension 01/07/2018   Crohn's colitis ([place]) 01/07/2018   Hyperlipidemia LDL goal <130 01/07/2018   Myasthenia ([place]) 12/28/2017   Benign non-nodular prostatic hyperplasia with lower urinary tract symptoms 12/28/2013   STRICTURE AND STENOSIS OF ESOPHAGUS 10/20/2007   ACID REFLUX DISEASE 10/20/2007   HIATAL HERNIA 10/20/2007   CROHN'S DISEASE 10/20/2007   DIVERTICULOSIS OF COLON 10/20/2007   SEBORRHEIC DERMATITIS 10/20/2007    REFERRING DIAG: R TKA 11/20/2021  THERAPY DIAG:  Chronic pain of right knee  Difficulty in walking, not elsewhere classified  Stiffness of right knee, not elsewhere  classified  Rationale for Evaluation and Treatment Rehabilitation  PERTINENT HISTORY: S/P R TKA 11/20/2021. Had home health PT for 4 days. PLOF: independent ambulation but with R knee pain. Has been active all his life. Next follow up appointment with Surgeon is this Thursday 11/30/2021 at 10:30 am. Plays guitar at church  PRECAUTIONS: HTN  SUBJECTIVE:   SUBJECTIVE STATEMENT: started using his [facility] Saturday, doing good. R knee has got a little pain but not bad. 3/10 currently. Took his blood pressure medicine just a few minutes ago. Dr. Mayer Camel thought the surgery looked good and took the bandage off.   PAIN:  Are you having pain? See subjective   TODAY'S TREATMENT:  DATE: 12/04/2021           Therapeutic exercise             Gait with SPC on correct side, 30 ft.   Start of session: Blood pressure L arm sitting, mechanically taken, normal cuff: 174/94, HR 65 Asymptomatic  Seated R knee extension stretch, foot propped up on stool 2 minutes   Then with quad set 10x5 seconds for 3 sets to promote knee extension       - 8 degrees knee extension in quad set position   Seated R knee flexion stretch 30 seconds x 5 with foot on furniture slider   Seated R knee flexion AAROM 102 degrees   Seated Blood pressure, L arm sitting, mechanically taken, normal cuff: 166/102, HR 60 (MAP 119). Asymptomatic After 2 minutes rest:  155/85, MAP 102   Gait with SPC 200 ft, cues for knee flexion   Gait from clinic to vehicle with SPC on correct side ( L side)       Improved exercise technique, movement at target joints, use of target muscles after mod verbal, visual, tactile cues.      Manual therapy  Seated with foot propped on stool  STM medial and lateral hamstrings to decrease tension    Sitting with knee in flexion    Gentle STM medial, and lateral knee  to decrease fascial restrictions      Response to treatment Pt tolerated session well without aggravation of symptoms.        Clinical impression Improved R knee extension and flexion AAROM compared to previous session. Pt also currently transitioned to a [facility] (pt was observed to utilize [facility] only, worked on gait training to be able to safely transition to [facility], pt able to perform safely observed).  Continued working on soft tissue techniques to decrease muscle tension and fascial restrictions. Blood pressure was initially elevated but improved with time. Pt was recommended to take his blood pressure medicine earlier. Pt verbalized understanding. Pt tolerated session well without aggravation of symptoms. Pt will benefit from continued skilled physical therapy services to decrease pain, improve ROM, strength and function.    PATIENT EDUCATION: Education details: there-ex Person educated: Patient Education method: Consulting civil engineer, Demonstration, Tactile cues, and Verbal cues Education comprehension: verbalized understanding and returned demonstration  HOME EXERCISE PROGRAM:         PT Short Term Goals - 11/27/21 1128       PT SHORT TERM GOAL #1   Title Pt will be independent with his initial HEP to improve ROM, strength and ability to ambulate with less difficulty.    Time 3    Period Weeks    Status New    Target Date 12/21/21              PT Long Term Goals - 11/27/21 1129       PT LONG TERM GOAL #1   Title Pt will improve R knee extension AROM to -5 degrees or more and R knee flexion AROM to 120 degrees or more to improve ability to ambulate as well as negotiate stairs with less difficulty.    Baseline Seated R knee AROM: -19 degrees extension, 92 degrees flexion (11/27/2021)    Time 8    Period Weeks    Status New    Target Date 01/25/22      PT LONG TERM GOAL #2   Title Pt will improve R LE strength by at least 1/2 MMT grade to  promote ability to perform standing  tasks, ambulate, and negotiate stairs with less difficulty.    Baseline R LE: 3-/5 hip flexion, 4/5 hip extension (seated manually resisted), 4/5 hip abduction (seated manually resisted), 3+/5 knee flexion, 4/5 knee extension (11/27/2021)    Time 8    Period Weeks    Status New    Target Date 01/25/22      PT LONG TERM GOAL #3   Title Pt will be able to ambulate without AD at least 500 ft, independently to promote mobility.    Baseline PT currently ambulates with rw (11/27/2021)    Time 8    Period Weeks    Status New    Target Date 01/25/22      PT LONG TERM GOAL #4   Title Pt will improve his knee FOTO score by at least 10 points as a demonstration of improved function.    Baseline R knee FOTO 38 (11/27/2021)    Time 8    Period Weeks    Status New    Target Date 01/25/22              Plan - 12/04/21 0803     Clinical Impression Statement Improved R knee extension and flexion AAROM compared to previous session. Pt also currently transitioned to a [facility] (pt was observed to utilize [facility] only, worked on gait training to be able to safely transition to [employer], pt able to perform safely observed).  Continued working on soft tissue techniques to decrease muscle tension and fascial restrictions. Blood pressure was initially elevated but improved with time. Pt was recommended to take his blood pressure medicine earlier. Pt verbalized understanding. Pt tolerated session well without aggravation of symptoms. Pt will benefit from continued skilled physical therapy services to decrease pain, improve ROM, strength and function.    Personal Factors and Comorbidities Age;Comorbidity 3+;Fitness;Past/Current Experience;Time since onset of injury/illness/exacerbation    Comorbidities HTN, arthritis, COPD    Examination-Activity Limitations Bathing;Stairs;Stand;Lift;Transfers;Carry;Locomotion Level;Squat    Stability/Clinical Decision Making Stable/Uncomplicated    Rehab Potential Fair    PT Frequency  2x / week    PT Duration 8 weeks    PT Treatment/Interventions Therapeutic activities;Therapeutic exercise;Functional mobility training;Balance training;Neuromuscular re-education;Patient/family education;Manual techniques;Aquatic Therapy;Electrical Stimulation;Gait training;Stair training    PT Next Visit Plan Knee ROM, R LE strengthening, gait training, balance, manual techniques, modalities PRN    Consulted and Agree with Plan of Care Patient               Joneen Boers PT, DPT  12/04/2021, 9:02 AM

## 2021-12-07 ENCOUNTER — Ambulatory Visit: Payer: Medicare Other | Admitting: Physical Therapy

## 2021-12-11 ENCOUNTER — Ambulatory Visit: Payer: Medicare Other | Attending: Orthopedic Surgery

## 2021-12-11 DIAGNOSIS — M25661 Stiffness of right knee, not elsewhere classified: Secondary | ICD-10-CM | POA: Insufficient documentation

## 2021-12-11 DIAGNOSIS — G8929 Other chronic pain: Secondary | ICD-10-CM | POA: Insufficient documentation

## 2021-12-11 DIAGNOSIS — M25561 Pain in right knee: Secondary | ICD-10-CM | POA: Diagnosis not present

## 2021-12-11 DIAGNOSIS — R262 Difficulty in walking, not elsewhere classified: Secondary | ICD-10-CM | POA: Diagnosis present

## 2021-12-11 NOTE — Therapy (Signed)
OUTPATIENT PHYSICAL THERAPY TREATMENT NOTE   Patient Name: Kyle ORLICH Sr. MRN: [id number] DOB:07-Feb-1940, 81 y.o., male Today's Date: 12/11/2021  PCP: Derinda Late, MD  REFERRING PROVIDER: Frederik Pear, MD   PT End of Session - 12/11/21 0934     Visit Number 4    Number of Visits 17    Date for PT Re-Evaluation 01/25/22    Authorization Type 4    Authorization Time Period 10 progress report    PT Start Time 0934    PT Stop Time 1014    PT Time Calculation (min) 40 min    Activity Tolerance Patient tolerated treatment well    Behavior During Therapy WFL for tasks assessed/performed               Past Medical History:  Diagnosis Date   Arthritis    COPD (chronic obstructive pulmonary disease) ([place])    Crohn's disease ([place])    Emphysema, unspecified ([place])    Hypertension    Myasthenia gravis ([place])    Past Surgical History:  Procedure Laterality Date   JOINT REPLACEMENT Left    total knee replacement    Dr. Marry Guan   TONSILLECTOMY     TOTAL KNEE ARTHROPLASTY Right 11/20/2021   Procedure: RIGHT TOTAL KNEE ARTHROPLASTY;  Surgeon: Frederik Pear, MD;  Location: [facility];  Service: Orthopedics;  Laterality: Right;   WISDOM TOOTH EXTRACTION     Patient Active Problem List   Diagnosis Date Noted   Osteoarthritis of right knee 11/15/2021   Benign essential hypertension 01/07/2018   Crohn's colitis ([place]) 01/07/2018   Hyperlipidemia LDL goal <130 01/07/2018   Myasthenia ([place]) 12/28/2017   Benign non-nodular prostatic hyperplasia with lower urinary tract symptoms 12/28/2013   STRICTURE AND STENOSIS OF ESOPHAGUS 10/20/2007   ACID REFLUX DISEASE 10/20/2007   HIATAL HERNIA 10/20/2007   CROHN'S DISEASE 10/20/2007   DIVERTICULOSIS OF COLON 10/20/2007   SEBORRHEIC DERMATITIS 10/20/2007    REFERRING DIAG: R TKA 11/20/2021  THERAPY DIAG:  Chronic pain of right knee  Difficulty in walking, not elsewhere classified  Rationale for Evaluation and Treatment  Rehabilitation  PERTINENT HISTORY: S/P R TKA 11/20/2021. Had home health PT for 4 days. PLOF: independent ambulation but with R knee pain. Has been active all his life. Next follow up appointment with Surgeon is this Thursday 11/30/2021 at 10:30 am. Plays guitar at church  PRECAUTIONS: HTN  SUBJECTIVE:   SUBJECTIVE STATEMENT: 1/10 R knee pain currently. Has been doing his HEP.   PAIN:  Are you having pain? See subjective   TODAY'S TREATMENT:                                                                                                                              DATE: 12/11/2021    Therapeutic exercise             Start of session: Blood pressure L arm sitting, mechanically taken, normal cuff: 145/91, HR 61  Seated R knee AROM   Extension: -12 degrees   Flexion: 110 degrees AAROM (with floor assist)   Seated R knee extension stretch, foot propped up on stool   With STM medial and lateral hamstrings to decrease tension    Then with quad set 10x5 seconds for 3 sets to promote knee extension       - 10 degrees knee extension in quad set position   Standing R knee flexion stretch on 1 regular step/step with one riser) with B UE assist 10x5 seconds for 3 sets  Standing B ankle DF/PF on rocker board with B UE assist 2 minutes to promote knee extension   Sit <> stand from 22 inch high mat table without UE assist, emphasis on proper weight shifting. 5x3  Side stepping with B UE assist 5 ft to the L and 5 ft to the R 10x    Improved exercise technique, movement at target joints, use of target muscles after mod verbal, visual, tactile cues.       Response to treatment Pt tolerated session well without aggravation of symptoms.        Clinical impression  Continued working on improving R knee extension and flexion A/AROM to promote ability to ambulate and negotiate curbs with less difficulty. Also worked on quad and glute strengthening to decrease difficulty with  transfers.  Improving R knee flexion AAROM. Pt tolerated session well without aggravation of symptoms. Pt will benefit from continued skilled physical therapy services to decrease pain, improve ROM, strength and function.    PATIENT EDUCATION: Education details: there-ex Person educated: Patient Education method: Consulting civil engineer, Demonstration, Tactile cues, and Verbal cues Education comprehension: verbalized understanding and returned demonstration  HOME EXERCISE PROGRAM:         PT Short Term Goals - 11/27/21 1128       PT SHORT TERM GOAL #1   Title Pt will be independent with his initial HEP to improve ROM, strength and ability to ambulate with less difficulty.    Time 3    Period Weeks    Status New    Target Date 12/21/21              PT Long Term Goals - 11/27/21 1129       PT LONG TERM GOAL #1   Title Pt will improve R knee extension AROM to -5 degrees or more and R knee flexion AROM to 120 degrees or more to improve ability to ambulate as well as negotiate stairs with less difficulty.    Baseline Seated R knee AROM: -19 degrees extension, 92 degrees flexion (11/27/2021)    Time 8    Period Weeks    Status New    Target Date 01/25/22      PT LONG TERM GOAL #2   Title Pt will improve R LE strength by at least 1/2 MMT grade to promote ability to perform standing tasks, ambulate, and negotiate stairs with less difficulty.    Baseline R LE: 3-/5 hip flexion, 4/5 hip extension (seated manually resisted), 4/5 hip abduction (seated manually resisted), 3+/5 knee flexion, 4/5 knee extension (11/27/2021)    Time 8    Period Weeks    Status New    Target Date 01/25/22      PT LONG TERM GOAL #3   Title Pt will be able to ambulate without AD at least 500 ft, independently to promote mobility.    Baseline PT currently ambulates with rw (11/27/2021)    Time  8    Period Weeks    Status New    Target Date 01/25/22      PT LONG TERM GOAL #4   Title Pt will improve his  knee FOTO score by at least 10 points as a demonstration of improved function.    Baseline R knee FOTO 38 (11/27/2021)    Time 8    Period Weeks    Status New    Target Date 01/25/22              Plan - 12/11/21 0926     Clinical Impression Statement Continued working on improving R knee extension and flexion A/AROM to promote ability to ambulate and negotiate curbs with less difficulty. Also worked on quad and glute strengthening to decrease difficulty with transfers.  Improving R knee flexion AAROM. Pt tolerated session well without aggravation of symptoms. Pt will benefit from continued skilled physical therapy services to decrease pain, improve ROM, strength and function.    Personal Factors and Comorbidities Age;Comorbidity 3+;Fitness;Past/Current Experience;Time since onset of injury/illness/exacerbation    Comorbidities HTN, arthritis, COPD    Examination-Activity Limitations Bathing;Stairs;Stand;Lift;Transfers;Carry;Locomotion Level;Squat    Stability/Clinical Decision Making Stable/Uncomplicated    Clinical Decision Making Low    Rehab Potential Fair    PT Frequency 2x / week    PT Duration 8 weeks    PT Treatment/Interventions Therapeutic activities;Therapeutic exercise;Functional mobility training;Balance training;Neuromuscular re-education;Patient/family education;Manual techniques;Aquatic Therapy;Electrical Stimulation;Gait training;Stair training    PT Next Visit Plan Knee ROM, R LE strengthening, gait training, balance, manual techniques, modalities PRN    Consulted and Agree with Plan of Care Patient                Joneen Boers PT, DPT  12/11/2021, 10:29 AM

## 2021-12-13 ENCOUNTER — Ambulatory Visit: Payer: Medicare Other

## 2021-12-13 DIAGNOSIS — R262 Difficulty in walking, not elsewhere classified: Secondary | ICD-10-CM

## 2021-12-13 DIAGNOSIS — M25661 Stiffness of right knee, not elsewhere classified: Secondary | ICD-10-CM

## 2021-12-13 DIAGNOSIS — M25561 Pain in right knee: Secondary | ICD-10-CM | POA: Diagnosis not present

## 2021-12-13 DIAGNOSIS — G8929 Other chronic pain: Secondary | ICD-10-CM

## 2021-12-13 NOTE — Therapy (Signed)
OUTPATIENT PHYSICAL THERAPY TREATMENT NOTE   Patient Name: Kyle REICHER Sr. MRN: [id number] DOB:03/14/1940, 81 y.o., male Today's Date: 12/13/2021  PCP: Derinda Late, MD  REFERRING PROVIDER: Frederik Pear, MD   PT End of Session - 12/13/21 1011     Visit Number 5    Number of Visits 17    Date for PT Re-Evaluation 01/25/22    Authorization Type 5    Authorization Time Period 10 progress report    PT Start Time 1011    PT Stop Time 1053    PT Time Calculation (min) 42 min    Activity Tolerance Patient tolerated treatment well    Behavior During Therapy WFL for tasks assessed/performed                Past Medical History:  Diagnosis Date   Arthritis    COPD (chronic obstructive pulmonary disease) ([place])    Crohn's disease ([place])    Emphysema, unspecified ([place])    Hypertension    Myasthenia gravis ([place])    Past Surgical History:  Procedure Laterality Date   JOINT REPLACEMENT Left    total knee replacement    Dr. Marry Guan   TONSILLECTOMY     TOTAL KNEE ARTHROPLASTY Right 11/20/2021   Procedure: RIGHT TOTAL KNEE ARTHROPLASTY;  Surgeon: Frederik Pear, MD;  Location: [facility];  Service: Orthopedics;  Laterality: Right;   WISDOM TOOTH EXTRACTION     Patient Active Problem List   Diagnosis Date Noted   Osteoarthritis of right knee 11/15/2021   Benign essential hypertension 01/07/2018   Crohn's colitis ([place]) 01/07/2018   Hyperlipidemia LDL goal <130 01/07/2018   Myasthenia ([place]) 12/28/2017   Benign non-nodular prostatic hyperplasia with lower urinary tract symptoms 12/28/2013   STRICTURE AND STENOSIS OF ESOPHAGUS 10/20/2007   ACID REFLUX DISEASE 10/20/2007   HIATAL HERNIA 10/20/2007   CROHN'S DISEASE 10/20/2007   DIVERTICULOSIS OF COLON 10/20/2007   SEBORRHEIC DERMATITIS 10/20/2007    REFERRING DIAG: R TKA 11/20/2021  THERAPY DIAG:  Chronic pain of right knee  Difficulty in walking, not elsewhere classified  Stiffness of right knee, not elsewhere  classified  Rationale for Evaluation and Treatment Rehabilitation  PERTINENT HISTORY: S/P R TKA 11/20/2021. Had home health PT for 4 days. PLOF: independent ambulation but with R knee pain. Has been active all his life. Next follow up appointment with Surgeon is this Thursday 11/30/2021 at 10:30 am. Plays guitar at church  PRECAUTIONS: HTN  SUBJECTIVE:   SUBJECTIVE STATEMENT: Took his BP medicine this morning around 7:15 am.  Has a little pain in R knee, about a 3/10 currently. There was some pain involved after last session but nothing excruciating. Makes it a little hard at night to sleep.   PAIN:  Are you having pain? See subjective   TODAY'S TREATMENT:  DATE: 12/13/2021    Therapeutic exercise             Start of session: Blood pressure L arm sitting, mechanically taken, normal cuff: 155/88, HR 58  Seated R knee AROM at start of session  Extension: -11 degrees   Flexion: 104 degrees AROM    Seated R knee extension stretch, foot propped up on stool     Then with quad set 10x5 seconds for 3 sets to promote knee extension       - 10 degrees knee extension in quad set position  Seated R knee flexion AAROM with PT 10x3  Forward step up onto Air Ex pad with  B UE assist   R 10x3  Standing R TKE, no resistance 10x5 seconds for 3 sets  Standing R gastroc stretch 30 seconds x 3  Standing L toe tap onto treadmill platform with L UE assist 10x3      Improved exercise technique, movement at target joints, use of target muscles after mod verbal, visual, tactile cues.    Manual therapy STM medial and lateral hamstrings to decrease tension and promote knee extension AROM      Response to treatment Pt tolerated session well without aggravation of symptoms. No R knee pain reported after session.        Clinical impression  Focused  predominantly with R knee flexion and extension ROM today to decrease stiffness. Improved ROM observed with no R knee pain reported after session. Pt will benefit from continued skilled physical therapy services to decrease pain, improve ROM, strength and function.    PATIENT EDUCATION: Education details: there-ex Person educated: Patient Education method: Consulting civil engineer, Demonstration, Tactile cues, and Verbal cues Education comprehension: verbalized understanding and returned demonstration  HOME EXERCISE PROGRAM:         PT Short Term Goals - 11/27/21 1128       PT SHORT TERM GOAL #1   Title Pt will be independent with his initial HEP to improve ROM, strength and ability to ambulate with less difficulty.    Time 3    Period Weeks    Status New    Target Date 12/21/21              PT Long Term Goals - 11/27/21 1129       PT LONG TERM GOAL #1   Title Pt will improve R knee extension AROM to -5 degrees or more and R knee flexion AROM to 120 degrees or more to improve ability to ambulate as well as negotiate stairs with less difficulty.    Baseline Seated R knee AROM: -19 degrees extension, 92 degrees flexion (11/27/2021)    Time 8    Period Weeks    Status New    Target Date 01/25/22      PT LONG TERM GOAL #2   Title Pt will improve R LE strength by at least 1/2 MMT grade to promote ability to perform standing tasks, ambulate, and negotiate stairs with less difficulty.    Baseline R LE: 3-/5 hip flexion, 4/5 hip extension (seated manually resisted), 4/5 hip abduction (seated manually resisted), 3+/5 knee flexion, 4/5 knee extension (11/27/2021)    Time 8    Period Weeks    Status New    Target Date 01/25/22      PT LONG TERM GOAL #3   Title Pt will be able to ambulate without AD at least 500 ft, independently to promote mobility.    Baseline PT currently  ambulates with rw (11/27/2021)    Time 8    Period Weeks    Status New    Target Date 01/25/22      PT LONG  TERM GOAL #4   Title Pt will improve his knee FOTO score by at least 10 points as a demonstration of improved function.    Baseline R knee FOTO 38 (11/27/2021)    Time 8    Period Weeks    Status New    Target Date 01/25/22              Plan - 12/13/21 1009     Clinical Impression Statement Focused predominantly with R knee flexion and extension ROM today to decrease stiffness. Improved ROM observed with no R knee pain reported after session. Pt will benefit from continued skilled physical therapy services to decrease pain, improve ROM, strength and function.    Personal Factors and Comorbidities Age;Comorbidity 3+;Fitness;Past/Current Experience;Time since onset of injury/illness/exacerbation    Comorbidities HTN, arthritis, COPD    Examination-Activity Limitations Bathing;Stairs;Stand;Lift;Transfers;Carry;Locomotion Level;Squat    Stability/Clinical Decision Making Stable/Uncomplicated    Rehab Potential Fair    PT Frequency 2x / week    PT Duration 8 weeks    PT Treatment/Interventions Therapeutic activities;Therapeutic exercise;Functional mobility training;Balance training;Neuromuscular re-education;Patient/family education;Manual techniques;Aquatic Therapy;Electrical Stimulation;Gait training;Stair training    PT Next Visit Plan Knee ROM, R LE strengthening, gait training, balance, manual techniques, modalities PRN    Consulted and Agree with Plan of Care Patient                 Joneen Boers PT, DPT  12/13/2021, 11:02 AM

## 2021-12-19 ENCOUNTER — Ambulatory Visit: Payer: Medicare Other

## 2021-12-19 DIAGNOSIS — M25561 Pain in right knee: Secondary | ICD-10-CM | POA: Diagnosis not present

## 2021-12-19 DIAGNOSIS — M25661 Stiffness of right knee, not elsewhere classified: Secondary | ICD-10-CM

## 2021-12-19 DIAGNOSIS — G8929 Other chronic pain: Secondary | ICD-10-CM

## 2021-12-19 DIAGNOSIS — R262 Difficulty in walking, not elsewhere classified: Secondary | ICD-10-CM

## 2021-12-19 NOTE — Therapy (Signed)
OUTPATIENT PHYSICAL THERAPY TREATMENT NOTE   Patient Name: Kyle Holloway. MRN: [id number] DOB:May 21, 1940, 81 y.o., male Today's Date: 12/19/2021  PCP: Derinda Late, MD  REFERRING PROVIDER: Frederik Pear, MD   PT End of Session - 12/19/21 1546     Visit Number 6    Number of Visits 17    Date for PT Re-Evaluation 01/25/22    Authorization Type 6    Authorization Time Period 10 progress report    PT Start Time 1546    PT Stop Time 1628    PT Time Calculation (min) 42 min    Activity Tolerance Patient tolerated treatment well    Behavior During Therapy WFL for tasks assessed/performed                 Past Medical History:  Diagnosis Date   Arthritis    COPD (chronic obstructive pulmonary disease) ([place])    Crohn's disease ([place])    Emphysema, unspecified ([place])    Hypertension    Myasthenia gravis ([place])    Past Surgical History:  Procedure Laterality Date   JOINT REPLACEMENT Left    total knee replacement    Dr. Marry Guan   TONSILLECTOMY     TOTAL KNEE ARTHROPLASTY Right 11/20/2021   Procedure: RIGHT TOTAL KNEE ARTHROPLASTY;  Surgeon: Frederik Pear, MD;  Location: [facility];  Service: Orthopedics;  Laterality: Right;   WISDOM TOOTH EXTRACTION     Patient Active Problem List   Diagnosis Date Noted   Osteoarthritis of right knee 11/15/2021   Benign essential hypertension 01/07/2018   Crohn's colitis ([place]) 01/07/2018   Hyperlipidemia LDL goal <130 01/07/2018   Myasthenia ([place]) 12/28/2017   Benign non-nodular prostatic hyperplasia with lower urinary tract symptoms 12/28/2013   STRICTURE AND STENOSIS OF ESOPHAGUS 10/20/2007   ACID REFLUX DISEASE 10/20/2007   HIATAL HERNIA 10/20/2007   CROHN'S DISEASE 10/20/2007   DIVERTICULOSIS OF COLON 10/20/2007   SEBORRHEIC DERMATITIS 10/20/2007    REFERRING DIAG: R TKA 11/20/2021  THERAPY DIAG:  Chronic pain of right knee  Difficulty in walking, not elsewhere classified  Stiffness of right knee, not elsewhere  classified  Rationale for Evaluation and Treatment Rehabilitation  PERTINENT HISTORY: S/P R TKA 11/20/2021. Had home health PT for 4 days. PLOF: independent ambulation but with R knee pain. Has been active all his life. Next follow up appointment with Surgeon is this Thursday 11/30/2021 at 10:30 am. Plays guitar at church  PRECAUTIONS: HTN  SUBJECTIVE:   SUBJECTIVE STATEMENT: Got rid of the [facility] Saturday, no problems with the walking.  0.5/10 R knee pain currently. Took his blood pressure medication at 7:30 am today.     PAIN:  Are you having pain? See subjective   TODAY'S TREATMENT:                                                                                                                              DATE: 12/19/2021    Therapeutic  exercise             Start of session: Blood pressure L arm sitting, mechanically taken, normal cuff: 150/93, HR 77  Seated R knee AROM at start of session  Extension: -16 degrees   Flexion: 108 degrees AROM   Reclined   Hooklying    Hip extension isometrics with quad set, leg straight 10x3 with 5 second holds    Standing R gastroc stretch 30 seconds x 3  Forward step up onto 4 inch step with B UE assist  R 10x   Then with L UE assist    R LE 10x    Then onto first regular step with B UE assist  10x   Then with L UE assist 10x   Side stepping 32 ft to the L and 32 ft to the R to promote glute med muscle strengthening.   Forward wedding march 32 ft x 2  Seated R knee flexion AAROM with PT 10x2     Improved exercise technique, movement at target joints, use of target muscles after mod verbal, visual, tactile cues.    Manual therapy  Reclined STM medial and lateral hamstrings to decrease tension and promote knee extension AROM Seated STM R anterior knee to improve fascial mobility     Response to treatment Pt tolerated session well without aggravation of symptoms.         Clinical impression  Pt currently 4 weeks post  op. Observed pt ambulating independently without use of AD, no LOB. Continued working on improving R knee flexion and extension AROM as well as functional strengthening to decrease difficulty with gait as well as curb and stair negotiation. Pt tolerated session well without aggravation of symptoms. Pt will benefit from continued skilled physical therapy services to decrease pain, improve ROM, strength and function.    PATIENT EDUCATION: Education details: there-ex Person educated: Patient Education method: Consulting civil engineer, Demonstration, Tactile cues, and Verbal cues Education comprehension: verbalized understanding and returned demonstration  HOME EXERCISE PROGRAM: Reclined   Hooklying    Hip extension isometrics with quad set, leg straight 10x3 with 5 second holds           PT Short Term Goals - 11/27/21 1128       PT SHORT TERM GOAL #1   Title Pt will be independent with his initial HEP to improve ROM, strength and ability to ambulate with less difficulty.    Time 3    Period Weeks    Status New    Target Date 12/21/21              PT Long Term Goals - 11/27/21 1129       PT LONG TERM GOAL #1   Title Pt will improve R knee extension AROM to -5 degrees or more and R knee flexion AROM to 120 degrees or more to improve ability to ambulate as well as negotiate stairs with less difficulty.    Baseline Seated R knee AROM: -19 degrees extension, 92 degrees flexion (11/27/2021)    Time 8    Period Weeks    Status New    Target Date 01/25/22      PT LONG TERM GOAL #2   Title Pt will improve R LE strength by at least 1/2 MMT grade to promote ability to perform standing tasks, ambulate, and negotiate stairs with less difficulty.    Baseline R LE: 3-/5 hip flexion, 4/5 hip extension (seated manually resisted), 4/5 hip abduction (seated manually  resisted), 3+/5 knee flexion, 4/5 knee extension (11/27/2021)    Time 8    Period Weeks    Status New    Target Date 01/25/22       PT LONG TERM GOAL #3   Title Pt will be able to ambulate without AD at least 500 ft, independently to promote mobility.    Baseline PT currently ambulates with rw (11/27/2021)    Time 8    Period Weeks    Status New    Target Date 01/25/22      PT LONG TERM GOAL #4   Title Pt will improve his knee FOTO score by at least 10 points as a demonstration of improved function.    Baseline R knee FOTO 38 (11/27/2021)    Time 8    Period Weeks    Status New    Target Date 01/25/22              Plan - 12/19/21 1549     Clinical Impression Statement Pt currently 4 weeks post op. Observed pt ambulating independently without use of AD, no LOB. Continued working on improving R knee flexion and extension AROM as well as functional strengthening to decrease difficulty with gait as well as curb and stair negotiation. Pt tolerated session well without aggravation of symptoms. Pt will benefit from continued skilled physical therapy services to decrease pain, improve ROM, strength and function.    Personal Factors and Comorbidities Age;Comorbidity 3+;Fitness;Past/Current Experience;Time since onset of injury/illness/exacerbation    Comorbidities HTN, arthritis, COPD    Examination-Activity Limitations Bathing;Stairs;Stand;Lift;Transfers;Carry;Locomotion Level;Squat    Stability/Clinical Decision Making Stable/Uncomplicated    Clinical Decision Making Low    Rehab Potential Fair    PT Frequency 2x / week    PT Duration 8 weeks    PT Treatment/Interventions Therapeutic activities;Therapeutic exercise;Functional mobility training;Balance training;Neuromuscular re-education;Patient/family education;Manual techniques;Aquatic Therapy;Electrical Stimulation;Gait training;Stair training    PT Next Visit Plan Knee ROM, R LE strengthening, gait training, balance, manual techniques, modalities PRN    Consulted and Agree with Plan of Care Patient                  Joneen Boers PT, DPT  12/19/2021,  5:56 PM

## 2021-12-21 ENCOUNTER — Ambulatory Visit: Payer: Medicare Other

## 2021-12-21 DIAGNOSIS — R262 Difficulty in walking, not elsewhere classified: Secondary | ICD-10-CM

## 2021-12-21 DIAGNOSIS — G8929 Other chronic pain: Secondary | ICD-10-CM

## 2021-12-21 DIAGNOSIS — M25561 Pain in right knee: Secondary | ICD-10-CM | POA: Diagnosis not present

## 2021-12-21 DIAGNOSIS — M25661 Stiffness of right knee, not elsewhere classified: Secondary | ICD-10-CM

## 2021-12-21 NOTE — Therapy (Signed)
[facility] [address] [place], [address] Phone: [phone]   Fax:  [phone]  Physical Therapy Treatment  Patient Details  Name: Kyle MARKEY Sr. MRN: [id number] Date of Birth: [birth year] Referring Provider (PT): Frederik Pear, MD   Encounter Date: 12/21/2021   PT End of Session - 12/21/21 1156     Visit Number 7    Number of Visits 17    Date for PT Re-Evaluation 01/25/22    Authorization Type Medicare primary, BCBS secondary    Authorization Time Period 11/27/21-01/25/22    Progress Note Due on Visit 7    PT Start Time 1150    PT Stop Time 1230    PT Time Calculation (min) 40 min    Equipment Utilized During Treatment Gait belt    Activity Tolerance Patient tolerated treatment well;No increased pain    Behavior During Therapy WFL for tasks assessed/performed             Past Medical History:  Diagnosis Date   Arthritis    COPD (chronic obstructive pulmonary disease) ([place])    Crohn's disease ([place])    Emphysema, unspecified ([place])    Hypertension    Myasthenia gravis ([place])     Past Surgical History:  Procedure Laterality Date   JOINT REPLACEMENT Left    total knee replacement    Dr. Marry Guan   TONSILLECTOMY     TOTAL KNEE ARTHROPLASTY Right 11/20/2021   Procedure: RIGHT TOTAL KNEE ARTHROPLASTY;  Surgeon: Frederik Pear, MD;  Location: [facility];  Service: Orthopedics;  Laterality: Right;   WISDOM TOOTH EXTRACTION      There were no vitals filed for this visit.   Subjective Assessment - 12/21/21 1155     Subjective Pain doing well today, has no major complaints    Pertinent History S/P R TKA 11/20/2021. Had home health PT for 4 days. PLOF: independent ambulation but with R knee pain. Has been active all his life.  Plays guitar at church.    Patient Stated Goals Return to normal daily funciton.    Currently in Pain? No/denies             INTERVENTION THIS DATE: -AMB  overground 868f AMB, no device -ROM assessment 5-120 degrees  -5xSTS: 17.73sec, hands free  -2x10 STS from chair + airex  -LAQ 2x010 -standing knee flexion 2x15 @ 2lb -tandem stance on 2x4 on treadmill 3x10sec bilat -AMB overground, outside, mulch, downhill, then circles CW, CCW around [employer]     PT Education - 12/21/21 1204     Education Details ROM and 5xSTS results    Person(s) Educated Patient    Methods Explanation;Demonstration    Comprehension Verbalized understanding;Returned demonstration              PT Short Term Goals - 11/27/21 1128       PT SHORT TERM GOAL #1   Title Pt will be independent with his initial HEP to improve ROM, strength and ability to ambulate with less difficulty.    Time 3    Period Weeks    Status New    Target Date 12/21/21               PT Long Term Goals - 11/27/21 1129       PT LONG TERM GOAL #1   Title Pt will improve R knee extension AROM to -5 degrees or more and R knee flexion AROM to 120  degrees or more to improve ability to ambulate as well as negotiate stairs with less difficulty.    Baseline Seated R knee AROM: -19 degrees extension, 92 degrees flexion (11/27/2021)    Time 8    Period Weeks    Status New    Target Date 01/25/22      PT LONG TERM GOAL #2   Title Pt will improve R LE strength by at least 1/2 MMT grade to promote ability to perform standing tasks, ambulate, and negotiate stairs with less difficulty.    Baseline R LE: 3-/5 hip flexion, 4/5 hip extension (seated manually resisted), 4/5 hip abduction (seated manually resisted), 3+/5 knee flexion, 4/5 knee extension (11/27/2021)    Time 8    Period Weeks    Status New    Target Date 01/25/22      PT LONG TERM GOAL #3   Title Pt will be able to ambulate without AD at least 500 ft, independently to promote mobility.    Baseline PT currently ambulates with rw (11/27/2021)    Time 8    Period Weeks    Status New    Target Date 01/25/22       PT LONG TERM GOAL #4   Title Pt will improve his knee FOTO score by at least 10 points as a demonstration of improved function.    Baseline R knee FOTO 38 (11/27/2021)    Time 8    Period Weeks    Status New    Target Date 01/25/22                   Plan - 12/21/21 1229     Clinical Impression Statement ROM no an impressive 5-120 degrees without any grimacing or guarding. Pt starts session with 872f AMB without device. Pain is well controlled and at goal today. HEP updated to reflect excellent progress toward goals.    Personal Factors and Comorbidities Age;Comorbidity 3+;Fitness;Past/Current Experience;Time since onset of injury/illness/exacerbation    Comorbidities HTN, arthritis, COPD    Examination-Activity Limitations Bathing;Stairs;Stand;Lift;Transfers;Carry;Locomotion Level;Squat    Stability/Clinical Decision Making Stable/Uncomplicated    Clinical Decision Making Low    Rehab Potential Fair    PT Frequency 2x / week    PT Duration 8 weeks    PT Treatment/Interventions Therapeutic activities;Therapeutic exercise;Functional mobility training;Balance training;Neuromuscular re-education;Patient/family education;Manual techniques;Aquatic Therapy;Electrical Stimulation;Gait training;Stair training    PT Next Visit Plan Advance strength, gait, motor control training.    PT Home Exercise Plan [id number] updated    Consulted and Agree with Plan of Care Patient             Patient will benefit from skilled therapeutic intervention in order to improve the following deficits and impairments:  Pain, Postural dysfunction, Improper body mechanics, Difficulty walking, Decreased strength, Decreased range of motion, Decreased balance, Abnormal gait, Decreased activity tolerance  Visit Diagnosis: Chronic pain of right knee  Difficulty in walking, not elsewhere classified  Stiffness of right knee, not elsewhere classified     Problem List Patient Active Problem List    Diagnosis Date Noted   Osteoarthritis of right knee 11/15/2021   Benign essential hypertension 01/07/2018   [place]'s [place] ([place] 01/07/2018   Hyperlipidemia LDL goal <130 01/07/2018   [place] 12/28/2017   Benign non-nodular prostatic hyperplasia with lower urinary tract symptoms 12/28/2013   STRICTURE AND STENOSIS OF ESOPHAGUS 10/20/2007   ACID REFLUX DISEASE 10/20/2007   HIATAL HERNIA 10/20/2007   CROHN'S DISEASE 10/20/2007   DIVERTICULOSIS OF  COLON 10/20/2007   SEBORRHEIC DERMATITIS 10/20/2007   12:58 PM, 12/21/21 Etta Grandchild, PT, DPT Physical Therapist - [place] [phone] (Office)   [place] C, PT 12/21/2021, 12:42 PM  [facility] [address], [place], [address] Phone: [phone]   Fax:  [phone]  Name: Kyle RODAS Sr. MRN: [id number] Date of Birth: [birth year]

## 2021-12-25 ENCOUNTER — Ambulatory Visit: Payer: Medicare Other

## 2021-12-25 DIAGNOSIS — R262 Difficulty in walking, not elsewhere classified: Secondary | ICD-10-CM

## 2021-12-25 DIAGNOSIS — G8929 Other chronic pain: Secondary | ICD-10-CM

## 2021-12-25 DIAGNOSIS — M25561 Pain in right knee: Secondary | ICD-10-CM | POA: Diagnosis not present

## 2021-12-25 DIAGNOSIS — M25661 Stiffness of right knee, not elsewhere classified: Secondary | ICD-10-CM

## 2021-12-25 NOTE — Therapy (Signed)
OUTPATIENT PHYSICAL THERAPY TREATMENT NOTE    Patient Name: Kyle SIRMON Sr. MRN: [id number] DOB:06-11-1940, 81 y.o., male Today's Date: 12/25/2021  PCP: Derinda Late, MD  REFERRING PROVIDER: Frederik Pear, MD   PT End of Session - 12/25/21 1500     Visit Number 8    Number of Visits 17    Date for PT Re-Evaluation 01/25/22    Authorization Type Medicare primary, BCBS secondary    Authorization Time Period 11/27/21-01/25/22    Progress Note Due on Visit 8    PT Start Time 1500    PT Stop Time 1541    PT Time Calculation (min) 41 min    Equipment Utilized During Treatment Gait belt    Activity Tolerance Patient tolerated treatment well;No increased pain    Behavior During Therapy WFL for tasks assessed/performed                  Past Medical History:  Diagnosis Date   Arthritis    COPD (chronic obstructive pulmonary disease) ([place])    Crohn's disease ([place])    Emphysema, unspecified ([place])    Hypertension    Myasthenia gravis ([place])    Past Surgical History:  Procedure Laterality Date   JOINT REPLACEMENT Left    total knee replacement    Dr. Marry Guan   TONSILLECTOMY     TOTAL KNEE ARTHROPLASTY Right 11/20/2021   Procedure: RIGHT TOTAL KNEE ARTHROPLASTY;  Surgeon: Frederik Pear, MD;  [facility];  Service: Orthopedics;  Laterality: Right;   WISDOM TOOTH EXTRACTION     Patient Active Problem List   Diagnosis Date Noted   Osteoarthritis of right knee 11/15/2021   Benign essential hypertension 01/07/2018   Crohn's colitis ([place]) 01/07/2018   Hyperlipidemia LDL goal <130 01/07/2018   Myasthenia ([place]) 12/28/2017   Benign non-nodular prostatic hyperplasia with lower urinary tract symptoms 12/28/2013   STRICTURE AND STENOSIS OF ESOPHAGUS 10/20/2007   ACID REFLUX DISEASE 10/20/2007   HIATAL HERNIA 10/20/2007   CROHN'S DISEASE 10/20/2007   DIVERTICULOSIS OF COLON 10/20/2007   SEBORRHEIC DERMATITIS 10/20/2007    REFERRING DIAG: R TKA  11/20/2021  THERAPY DIAG:  Chronic pain of right knee  Difficulty in walking, not elsewhere classified  Stiffness of right knee, not elsewhere classified  Rationale for Evaluation and Treatment Rehabilitation  PERTINENT HISTORY: S/P R TKA 11/20/2021. Had home health PT for 4 days. PLOF: independent ambulation but with R knee pain. Has been active all his life. Next follow up appointment with Surgeon is this Thursday 11/30/2021 at 10:30 am. Plays guitar at church  PRECAUTIONS: HTN  SUBJECTIVE:   SUBJECTIVE STATEMENT: No pain currently. Going back to Dr. Mayer Camel tomorrow.     PAIN:  Are you having pain? See subjective   TODAY'S TREATMENT:  DATE: 12/25/2021    Therapeutic exercise             Sit <> stand without UE assist from regular chair   L knee discomfort.     Performed seated medial glide L patella grade 3 to promote mobility, then STM to L lateral knee to improve fascial mobility.   Sit <> stand 5x again. No change in L knee symptoms.    Side stepping 20 ft to the R and 20 ft to the L   SLS R LE with L 2 finger assist 10x3 with 5 seconds   Seated R knee AROM   Seated manually resisted hip flexion, hip extension, hip abductoin, knee flexion, knee extension 1x each way  Reviewed progress/current status with PT towards goals.    Forward step up onto first regular step with R LE  with L UE assist 10x  Ascending and descending 4 regular steps with B UE assist 3x reciprocal pattern   Standing R gastroc stretch 30 seconds x 3  Standing TKE 10x3 with 5 second holds  Standing hip machine, height 4   Hip extension    R 10x3 at plate 40   Static standing mini lunge with L LUE assist   R 10x3  Standing B heel toe raises with B UE assist 10x3       Improved exercise technique, movement at target joints, use of target muscles after mod  verbal, visual, tactile cues.        Response to treatment Pt tolerated session well without aggravation of symptoms.         Clinical impression  Pt demonstrates improved R knee flexion and extension AROM, R LE strength, function and ability to ambulate since initial evaluation. Pt making very good progress with PT towards goals. . Pt tolerated session well without aggravation of symptoms. Pt will benefit from continued skilled physical therapy services to continue to improve ROM, strength and function.    PATIENT EDUCATION: Education details: there-ex Person educated: Patient Education method: Consulting civil engineer, Demonstration, Tactile cues, and Verbal cues Education comprehension: verbalized understanding and returned demonstration  HOME EXERCISE PROGRAM: Reclined   Hooklying    Hip extension isometrics with quad set, leg straight 10x3 with 5 second holds           PT Short Term Goals - 12/25/21 1513       PT SHORT TERM GOAL #1   Title Pt will be independent with his initial HEP to improve ROM, strength and ability to ambulate with less difficulty.    Baseline No questions with home exercises (12/25/2021)    Time 3    Period Weeks    Status Achieved    Target Date 12/21/21              PT Long Term Goals - 12/25/21 1513       PT LONG TERM GOAL #1   Title Pt will improve R knee extension AROM to -5 degrees or more and R knee flexion AROM to 120 degrees or more to improve ability to ambulate as well as negotiate stairs with less difficulty.    Baseline Seated R knee AROM: -19 degrees extension, 92 degrees flexion (11/27/2021); -7 degrees extension, 114 degrees flexion (12/25/2021)    Time 8    Period Weeks    Status Partially Met    Target Date 01/25/22      PT LONG TERM GOAL #2   Title Pt will improve R LE strength by  at least 1/2 MMT grade to promote ability to perform standing tasks, ambulate, and negotiate stairs with less difficulty.    Baseline R LE:  3-/5 hip flexion, 4/5 hip extension (seated manually resisted), 4/5 hip abduction (seated manually resisted), 3+/5 knee flexion, 4/5 knee extension (11/27/2021); 4+/5 hip flexion, 4+/5 hip extension, 5/5 hip abduction (seated manually resisted), 4+/5 knee flexion, 5/5 knee extension (12/25/2021)    Time 8    Period Weeks    Status Achieved    Target Date 01/25/22      PT LONG TERM GOAL #3   Title Pt will be able to ambulate without AD at least 500 ft, independently to promote mobility.    Baseline PT currently ambulates with rw (11/27/2021). Independent ambulation > 500 ft (12/25/2021)    Time 8    Period Weeks    Status Achieved    Target Date 01/25/22      PT LONG TERM GOAL #4   Title Pt will improve his knee FOTO score by at least 10 points as a demonstration of improved function.    Baseline R knee FOTO 38 (11/27/2021); 99 (12/25/2021)    Time 8    Period Weeks    Status Achieved    Target Date 01/25/22              Plan - 12/25/21 1454     Clinical Impression Statement Pt demonstrates improved R knee flexion and extension AROM, R LE strength, function and ability to ambulate since initial evaluation. Pt making very good progress with PT towards goals. . Pt tolerated session well without aggravation of symptoms. Pt will benefit from continued skilled physical therapy services to continue to improve ROM, strength and function.    Personal Factors and Comorbidities Age;Comorbidity 3+;Fitness;Past/Current Experience;Time since onset of injury/illness/exacerbation    Comorbidities HTN, arthritis, COPD    Examination-Activity Limitations Bathing;Stairs;Stand;Lift;Transfers;Carry;Locomotion Level;Squat    Stability/Clinical Decision Making Stable/Uncomplicated    Clinical Decision Making Low    Rehab Potential Fair    PT Frequency 2x / week    PT Duration 8 weeks    PT Treatment/Interventions Therapeutic activities;Therapeutic exercise;Functional mobility training;Balance  training;Neuromuscular re-education;Patient/family education;Manual techniques;Aquatic Therapy;Electrical Stimulation;Gait training;Stair training    PT Next Visit Plan Advance strength, gait, motor control training.    PT Home Exercise Plan [id number], updated    Consulted and Agree with Plan of Care Patient                  Joneen Boers PT, DPT  12/25/2021, 5:58 PM

## 2022-01-01 ENCOUNTER — Ambulatory Visit: Payer: Medicare Other

## 2023-02-13 ENCOUNTER — Other Ambulatory Visit: Payer: Self-pay | Admitting: Family Medicine

## 2023-02-13 DIAGNOSIS — R748 Abnormal levels of other serum enzymes: Secondary | ICD-10-CM

## 2023-02-14 ENCOUNTER — Ambulatory Visit
Admission: RE | Admit: 2023-02-14 | Discharge: 2023-02-14 | Disposition: A | Discharge status: Home / Self Care | Payer: Medicare Other | Source: Ambulatory Visit | Attending: Family Medicine | Admitting: Family Medicine

## 2023-02-14 DIAGNOSIS — R748 Abnormal levels of other serum enzymes: Secondary | ICD-10-CM | POA: Insufficient documentation

## 2023-03-19 ENCOUNTER — Other Ambulatory Visit: Payer: Self-pay

## 2023-03-19 ENCOUNTER — Observation Stay: Payer: Medicare Other | Admitting: Anesthesiology

## 2023-03-19 ENCOUNTER — Observation Stay: Payer: Medicare Other

## 2023-03-19 ENCOUNTER — Encounter
Admission: EM | Disposition: A | Discharge status: Home / Self Care | Payer: Self-pay | Source: Home / Self Care | Attending: Internal Medicine

## 2023-03-19 ENCOUNTER — Inpatient Hospital Stay
Admission: EM | Admit: 2023-03-19 | Discharge: 2023-03-23 | DRG: 982 | Disposition: A | Discharge status: Home / Self Care | Payer: Medicare Other | Attending: Internal Medicine | Admitting: Internal Medicine

## 2023-03-19 DIAGNOSIS — K922 Gastrointestinal hemorrhage, unspecified: Secondary | ICD-10-CM | POA: Diagnosis present

## 2023-03-19 DIAGNOSIS — K5010 Crohn's disease of large intestine without complications: Secondary | ICD-10-CM | POA: Diagnosis present

## 2023-03-19 DIAGNOSIS — K625 Hemorrhage of anus and rectum: Principal | ICD-10-CM

## 2023-03-19 DIAGNOSIS — K5731 Diverticulosis of large intestine without perforation or abscess with bleeding: Secondary | ICD-10-CM | POA: Diagnosis not present

## 2023-03-19 DIAGNOSIS — K921 Melena: Secondary | ICD-10-CM

## 2023-03-19 DIAGNOSIS — K259 Gastric ulcer, unspecified as acute or chronic, without hemorrhage or perforation: Secondary | ICD-10-CM | POA: Diagnosis not present

## 2023-03-19 DIAGNOSIS — I10 Essential (primary) hypertension: Secondary | ICD-10-CM | POA: Diagnosis present

## 2023-03-19 DIAGNOSIS — K449 Diaphragmatic hernia without obstruction or gangrene: Secondary | ICD-10-CM | POA: Diagnosis present

## 2023-03-19 DIAGNOSIS — E785 Hyperlipidemia, unspecified: Secondary | ICD-10-CM | POA: Diagnosis present

## 2023-03-19 DIAGNOSIS — N400 Enlarged prostate without lower urinary tract symptoms: Secondary | ICD-10-CM | POA: Diagnosis present

## 2023-03-19 DIAGNOSIS — E876 Hypokalemia: Secondary | ICD-10-CM | POA: Diagnosis present

## 2023-03-19 DIAGNOSIS — Z515 Encounter for palliative care: Secondary | ICD-10-CM

## 2023-03-19 DIAGNOSIS — Z808 Family history of malignant neoplasm of other organs or systems: Secondary | ICD-10-CM

## 2023-03-19 DIAGNOSIS — K2970 Gastritis, unspecified, without bleeding: Secondary | ICD-10-CM | POA: Diagnosis present

## 2023-03-19 DIAGNOSIS — I493 Ventricular premature depolarization: Secondary | ICD-10-CM | POA: Diagnosis present

## 2023-03-19 DIAGNOSIS — Z87891 Personal history of nicotine dependence: Secondary | ICD-10-CM

## 2023-03-19 DIAGNOSIS — J439 Emphysema, unspecified: Secondary | ICD-10-CM | POA: Diagnosis present

## 2023-03-19 DIAGNOSIS — Z79899 Other long term (current) drug therapy: Secondary | ICD-10-CM

## 2023-03-19 DIAGNOSIS — G7000 Myasthenia gravis without (acute) exacerbation: Secondary | ICD-10-CM | POA: Diagnosis present

## 2023-03-19 DIAGNOSIS — Z7982 Long term (current) use of aspirin: Secondary | ICD-10-CM

## 2023-03-19 DIAGNOSIS — D62 Acute posthemorrhagic anemia: Secondary | ICD-10-CM | POA: Diagnosis present

## 2023-03-19 DIAGNOSIS — Z96651 Presence of right artificial knee joint: Secondary | ICD-10-CM | POA: Diagnosis present

## 2023-03-19 HISTORY — PX: ESOPHAGOGASTRODUODENOSCOPY: SHX5428

## 2023-03-19 LAB — HEPATIC FUNCTION PANEL
ALT: 14 U/L (ref 0–44)
AST: 22 U/L (ref 15–41)
Albumin: 3.3 g/dL — ABNORMAL LOW (ref 3.5–5.0)
Alkaline Phosphatase: 43 U/L (ref 38–126)
Bilirubin, Direct: 0.1 mg/dL (ref 0.0–0.2)
Indirect Bilirubin: 0.6 mg/dL (ref 0.3–0.9)
Total Bilirubin: 0.7 mg/dL (ref 0.0–1.2)
Total Protein: 6.4 g/dL — ABNORMAL LOW (ref 6.5–8.1)

## 2023-03-19 LAB — BASIC METABOLIC PANEL
Anion gap: 13 (ref 5–15)
BUN: 19 mg/dL (ref 8–23)
CO2: 16 mmol/L — ABNORMAL LOW (ref 22–32)
Calcium: 8.3 mg/dL — ABNORMAL LOW (ref 8.9–10.3)
Chloride: 108 mmol/L (ref 98–111)
Creatinine, Ser: 1.04 mg/dL (ref 0.61–1.24)
GFR, Estimated: >60 mL/min (ref >60)
Glucose, Bld: 101 mg/dL — ABNORMAL HIGH (ref 70–99)
Potassium: 3.8 mmol/L (ref 3.5–5.1)
Sodium: 137 mmol/L (ref 135–145)

## 2023-03-19 LAB — CBC WITH DIFFERENTIAL/PLATELET
Abs Immature Granulocytes: 0.03 10*3/uL (ref 0.00–0.07)
Basophils Absolute: 0.1 10*3/uL (ref 0.0–0.1)
Basophils Relative: 1 %
Eosinophils Absolute: 0.3 10*3/uL (ref 0.0–0.5)
Eosinophils Relative: 4 %
HCT: 41.5 % (ref 39.0–52.0)
Hemoglobin: 13.7 g/dL (ref 13.0–17.0)
Immature Granulocytes: 0 %
Lymphocytes Relative: 31 %
Lymphs Abs: 2.5 10*3/uL (ref 0.7–4.0)
MCH: 32.1 pg (ref 26.0–34.0)
MCHC: 33 g/dL (ref 30.0–36.0)
MCV: 97.2 fL (ref 80.0–100.0)
Monocytes Absolute: 1 10*3/uL (ref 0.1–1.0)
Monocytes Relative: 13 %
Neutro Abs: 4.1 10*3/uL (ref 1.7–7.7)
Neutrophils Relative %: 51 %
Platelets: 292 10*3/uL (ref 150–400)
RBC: 4.27 MIL/uL (ref 4.22–5.81)
RDW: 15.2 % (ref 11.5–15.5)
WBC: 8 10*3/uL (ref 4.0–10.5)
nRBC: 0 % (ref 0.0–0.2)

## 2023-03-19 LAB — APTT: aPTT: 34 s (ref 24–36)

## 2023-03-19 LAB — PROTIME-INR
INR: 1.2 (ref 0.8–1.2)
Prothrombin Time: 15.2 s (ref 11.4–15.2)

## 2023-03-19 LAB — HEMOGLOBIN AND HEMATOCRIT, BLOOD
HCT: 32.9 % — ABNORMAL LOW (ref 39.0–52.0)
Hemoglobin: 11 g/dL — ABNORMAL LOW (ref 13.0–17.0)

## 2023-03-19 SURGERY — EGD (ESOPHAGOGASTRODUODENOSCOPY)
Anesthesia: General

## 2023-03-19 MED ORDER — LACTATED RINGERS IV BOLUS
1000.0000 mL | Freq: Once | INTRAVENOUS | Status: AC
  Administered 2023-03-19: 1000 mL via INTRAVENOUS

## 2023-03-19 MED ORDER — HYDRALAZINE HCL 20 MG/ML IJ SOLN: 5.0000 mg | Freq: Four times a day (QID) | INTRAMUSCULAR | Status: DC | PRN

## 2023-03-19 MED ORDER — PHENYLEPHRINE 80 MCG/ML (10ML) SYRINGE FOR IV PUSH (FOR BLOOD PRESSURE SUPPORT)
PREFILLED_SYRINGE | INTRAVENOUS | Status: DC | PRN
  Administered 2023-03-19: 80 ug via INTRAVENOUS

## 2023-03-19 MED ORDER — PEG 3350-KCL-NA BICARB-NACL 420 G PO SOLR
4000.0000 mL | Freq: Once | ORAL | Status: AC
  Administered 2023-03-19: 4000 mL via ORAL
  Filled 2023-03-19: qty 4000

## 2023-03-19 MED ORDER — PANTOPRAZOLE SODIUM 40 MG IV SOLR
80.0000 mg | Freq: Once | INTRAVENOUS | Status: AC
  Administered 2023-03-19: 80 mg via INTRAVENOUS
  Filled 2023-03-19: qty 20

## 2023-03-19 MED ORDER — IOHEXOL 350 MG/ML SOLN
100.0000 mL | Freq: Once | INTRAVENOUS | Status: AC | PRN
  Administered 2023-03-19: 100 mL via INTRAVENOUS

## 2023-03-19 MED ORDER — LIDOCAINE HCL (CARDIAC) PF 100 MG/5ML IV SOSY
PREFILLED_SYRINGE | INTRAVENOUS | Status: DC | PRN
  Administered 2023-03-19: 80 mg via INTRAVENOUS

## 2023-03-19 MED ORDER — PROPOFOL 500 MG/50ML IV EMUL
INTRAVENOUS | Status: DC | PRN
  Administered 2023-03-19: 50 ug/kg/min via INTRAVENOUS

## 2023-03-19 MED ORDER — LACTATED RINGERS IV SOLN: INTRAVENOUS | Status: AC

## 2023-03-19 MED ORDER — SODIUM CHLORIDE 0.9 % IV SOLN: INTRAVENOUS | Status: DC

## 2023-03-19 MED ORDER — GLYCOPYRROLATE 0.2 MG/ML IJ SOLN
INTRAMUSCULAR | Status: AC
  Filled 2023-03-19: qty 1

## 2023-03-19 MED ORDER — PROPOFOL 10 MG/ML IV BOLUS
INTRAVENOUS | Status: DC | PRN
  Administered 2023-03-19: 30 mg via INTRAVENOUS
  Administered 2023-03-19: 10 mg via INTRAVENOUS

## 2023-03-19 MED ORDER — PHENYLEPHRINE 80 MCG/ML (10ML) SYRINGE FOR IV PUSH (FOR BLOOD PRESSURE SUPPORT)
PREFILLED_SYRINGE | INTRAVENOUS | Status: AC
  Filled 2023-03-19: qty 10

## 2023-03-19 MED ORDER — LIDOCAINE HCL (PF) 2 % IJ SOLN
INTRAMUSCULAR | Status: AC
  Filled 2023-03-19: qty 5

## 2023-03-19 MED ORDER — GLYCOPYRROLATE 0.2 MG/ML IJ SOLN
INTRAMUSCULAR | Status: DC | PRN
  Administered 2023-03-19: .2 mg via INTRAVENOUS

## 2023-03-19 MED ORDER — MORPHINE SULFATE (PF) 2 MG/ML IV SOLN: 2.0000 mg | INTRAVENOUS | Status: DC | PRN

## 2023-03-19 NOTE — ED Notes (Addendum)
Pt arrives w/ c/o dark stools since 2 am. States is going ~ every 45 mins. Reported Hx of w/ need for blood transfusion. Denies dizziness/abd pain/N/V.

## 2023-03-19 NOTE — Anesthesia Preprocedure Evaluation (Signed)
Anesthesia Evaluation  Patient identified by MRN, date of birth, ID band Patient awake    Reviewed: Allergy & Precautions, NPO status , Patient's Chart, lab work & pertinent test results  History of Anesthesia Complications Negative for: history of anesthetic complications  Airway Mallampati: III  TM Distance: >3 FB Neck ROM: full    Dental  (+) Missing   Pulmonary shortness of breath and with exertion, COPD, former smoker   Pulmonary exam normal        Cardiovascular Exercise Tolerance: Good hypertension, (-) angina Normal cardiovascular exam     Neuro/Psych  Neuromuscular disease  negative psych ROS   GI/Hepatic Neg liver ROS,GERD  Controlled,,  Endo/Other  negative endocrine ROS    Renal/GU negative Renal ROS  negative genitourinary   Musculoskeletal   Abdominal   Peds  Hematology negative hematology ROS (+)   Anesthesia Other Findings Past Medical History: No date: Arthritis No date: COPD (chronic obstructive pulmonary disease) (HCC) No date: Crohn's disease (HCC) No date: Emphysema, unspecified (HCC) No date: Hypertension No date: Myasthenia gravis (HCC)  Past Surgical History: No date: JOINT REPLACEMENT; Left     Comment:  total knee replacement    Dr. Ernest Pine No date: TONSILLECTOMY 11/20/2021: TOTAL KNEE ARTHROPLASTY; Right     Comment:  Procedure: RIGHT TOTAL KNEE ARTHROPLASTY;  Surgeon:               Gean Birchwood, MD;  Location: [facility];  Service:               Orthopedics;  Laterality: Right; No date: WISDOM TOOTH EXTRACTION  BMI    Body Mass Index: 29.26 kg/m      Reproductive/Obstetrics negative OB ROS                             Anesthesia Physical Anesthesia Plan  ASA: 3  Anesthesia Plan: General   Post-op Pain Management:    Induction: Intravenous  PONV Risk Score and Plan: Propofol infusion and TIVA  Airway Management Planned: Natural Airway and  Nasal Cannula  Additional Equipment:   Intra-op Plan:   Post-operative Plan:   Informed Consent: I have reviewed the patients History and Physical, chart, labs and discussed the procedure including the risks, benefits and alternatives for the proposed anesthesia with the patient or authorized representative who has indicated his/her understanding and acceptance.     Dental Advisory Given  Plan Discussed with: Anesthesiologist, CRNA and Surgeon  Anesthesia Plan Comments: (Patient consented for risks of anesthesia including but not limited to:  - adverse reactions to medications - risk of airway placement if required - damage to eyes, teeth, lips or other oral mucosa - nerve damage due to positioning  - sore throat or hoarseness - Damage to heart, brain, nerves, lungs, other parts of body or loss of life  Patient voiced understanding and assent.)       Anesthesia Quick Evaluation

## 2023-03-19 NOTE — Consult Note (Signed)
Kyle Minium, MD [facility]  [address] [place], [place] [address] Phone: [phone] Fax : [phone]  Consultation  Referring Provider:    Dr. Clide Dales  Primary Care Physician:  Kandyce Rud, MD Primary Gastroenterologist:  Dr. Norma Fredrickson         Reason for Consultation:     Melena  Date of Admission:  03/19/2023 Date of Consultation:  03/19/2023         HPI:   Kyle Holloway. is a 83 y.o. male who comes in today with a history of having Crohn's disease for many years.  The patient states his last colonoscopy was over 10 years ago.  The patient now was admitted with black stools.  The emergency room doctor reported that the patient was having bright red blood per rectum and dark stools but the patient assures me that he only had black tarry stools.  He states that it started in the early morning hours and was happening every 40 minutes.  The patient reports that he did take some ibuprofen recently.  There is no report of any alcohol or tobacco use.  He reports that he did have a history about 7 years ago with similar issues.  He denies taking any anticoagulation medications.  The patient reports that he has been on balsalazide 3 times a day for his Crohn's disease.  He states that he has not had a flare in many years.  The patient also takes methotrexate for his myasthenia gravis.  The patient's labs have shown:  Component     Latest Ref Rng 06/17/2020 11/07/2021 03/19/2023  Hemoglobin     13.0 - 17.0 g/dL 65.7  84.6  96.2   HCT     39.0 - 52.0 % 44.7  44.8  41.5   MCV     80.0 - 100.0 fL 96.1  101.4 (H)  97.2    The patient also had a CT scan of the abdomen that showed:  IMPRESSION: 1. 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 There is subtle pericolonic fat stranding and prominence of vasa recta throughout the colon without definite wall thickening, concerning for subacute/early pancolitis, most likely infective/inflammatory in etiology. No active  extravasation of intravenous contrast to suggest active bleeding. 2. 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 Recommend surveillance ultrasound in 3 years. Reference: Journal of Vascular Surgery 67.1 (2018): 2-77. J Am Coll Radiol 2013;10:789-794. 3. Multiple other nonacute observations, as described above.   Aortic Atherosclerosis (ICD10-I70.0) and Emphysema (ICD10-J43.9).   Aortic aneurysm NOS  Despite these findings the patient reports that he has no abdominal pain or any diarrhea.  He does have a history of Crohn's colitis and was following up with Dr. Norma Fredrickson on a yearly basis.  Past Medical History:  Diagnosis Date   Arthritis    COPD (chronic obstructive pulmonary disease) (HCC)    Crohn's disease (HCC)    Emphysema, unspecified (HCC)    Hypertension    Myasthenia gravis (HCC)     Past Surgical History:  Procedure Laterality Date   JOINT REPLACEMENT Left    total knee replacement    Dr. Ernest Pine   TONSILLECTOMY     TOTAL KNEE ARTHROPLASTY Right 11/20/2021   Procedure: RIGHT TOTAL KNEE ARTHROPLASTY;  Surgeon: Gean Birchwood, MD;  Location: [facility];  Service: Orthopedics;  Laterality: Right;   WISDOM TOOTH EXTRACTION      Prior  to Admission medications   Medication Sig Start Date End Date Taking? Authorizing Provider  acetaminophen (TYLENOL) 325 MG tablet Take 650 mg by mouth 3 (three) times daily as needed (pain).   Yes [provider]  albuterol (PROVENTIL HFA;VENTOLIN HFA) 108 (90 Base) MCG/ACT inhaler Inhale 2 puffs into the lungs every 6 (six) hours as needed for wheezing or shortness of breath. 12/29/17  Yes Salary, Evelena Asa, MD  aspirin EC 81 MG tablet Take 81 mg by mouth daily.    Yes [provider]  atenolol (TENORMIN) 25 MG tablet Take 1 tablet by mouth daily. 04/19/22 04/19/23 Yes [provider]  balsalazide (COLAZAL) 750 MG capsule Take 750 mg by  mouth 3 (three) times daily. 09/24/17  Yes [provider]  Biotin 1 MG CAPS Take 1 mg by mouth daily.   Yes [provider]  finasteride (PROSCAR) 5 MG tablet Take 5 mg by mouth daily. 12/12/17  Yes [provider]  folic acid (FOLVITE) 1 MG tablet Take 1 mg by mouth daily. Every day except Wednesday. 07/06/14  Yes [provider]  methotrexate (RHEUMATREX) 2.5 MG tablet Take 2 tablets (5 mg total) by mouth once a week. Every Wednesday  Caution:Chemotherapy. Protect from light. Patient taking differently: Take 10 mg by mouth once a week. On Wednesday. 12/29/17  Yes Salary, Evelena Asa, MD  Multiple Vitamins-Minerals (MULTIVITAMIN WITH MINERALS) tablet Take 1 tablet by mouth daily.   Yes [provider]  Multiple Vitamins-Minerals (PRESERVISION AREDS PO) Take 1 tablet by mouth in the morning and at bedtime.   Yes [provider]  omeprazole (PRILOSEC) 20 MG capsule Take 20 mg by mouth 2 (two) times daily. 09/06/21  Yes [provider]  prednisoLONE acetate (PRED FORTE) 1 % ophthalmic suspension Place 1 drop into the left eye 2 (two) times daily. 11/02/21  Yes [provider]  pyridostigmine (MESTINON) 60 MG tablet Take 0.5 tablets (30 mg total) by mouth every 8 (eight) hours. 12/29/17  Yes Salary, Evelena Asa, MD  tamsulosin (FLOMAX) 0.4 MG CAPS capsule Take 0.4 mg by mouth daily. 12/12/17  Yes [provider]  valACYclovir (VALTREX) 500 MG tablet Take 500 mg by mouth daily. 11/04/21  Yes [provider]  vitamin B-12 (CYANOCOBALAMIN) 500 MCG tablet Take 500 mcg by mouth daily.    Yes [provider]  Bromfenac Sodium 0.09 % SOLN Apply to eye as directed. Patient not taking: Reported on 03/19/2023 09/28/21   [provider]    Family History  Problem Relation Age of Onset   Brain cancer Father      Social History   Tobacco Use   Smoking status: Former    Current packs/day: 0.00    Average  packs/day: 1.5 packs/day for 95.0 years (142.5 ttl pk-yrs)    Types: Cigarettes    Start date: 64    Quit date: 1995    Years since quitting: 30.1   Smokeless tobacco: Never  Vaping Use   Vaping status: Never Used  Substance Use Topics   Alcohol use: Not Currently   Drug use: Never    Allergies as of 03/19/2023   (No Known Allergies)    Review of Systems:    All systems reviewed and negative except where noted in HPI.   Physical Exam:  Vital signs in last 24 hours: Temp:  [95.3 F (35.2 C)-98.5 F (36.9 C)] 95.3 F (35.2 C) (02/11 1327) Pulse Rate:  [60-90] 90 (02/11 1327) Resp:  [18-20]  18 (02/11 1327) BP: (121-171)/(82-118) 121/93 (02/11 1327) SpO2:  [96 %-100 %] 99 % (02/11 1327) Weight:  [92.5 kg] 92.5 kg (02/11 1327)   General:   Pleasant, cooperative in NAD Head:  Normocephalic and atraumatic. Eyes:   No icterus.   Conjunctiva pink. PERRLA. Ears:  Normal auditory acuity. Neck:  Supple; no masses or thyroidomegaly Lungs: Respirations even and unlabored. Lungs clear to auscultation bilaterally.   No wheezes, crackles, or rhonchi.  Heart:  Regular rate and rhythm;  Without murmur, clicks, rubs or gallops Abdomen:  Soft, nondistended, nontender. Normal bowel sounds. No appreciable masses or hepatomegaly.  No rebound or guarding.  Rectal:  Not performed. Msk:  Symmetrical without gross deformities.    Extremities:  Without edema, cyanosis or clubbing. Neurologic:  Alert and oriented x3;  grossly normal neurologically. Skin:  Intact without significant lesions or rashes. Cervical Nodes:  No significant cervical adenopathy. Psych:  Alert and cooperative. Normal affect.  LAB RESULTS: Recent Labs    03/19/23 0358  WBC 8.0  HGB 13.7  HCT 41.5  PLT 292   BMET Recent Labs    03/19/23 0358  NA 137  K 3.8  CL 108  CO2 16*  GLUCOSE 101*  BUN 19  CREATININE 1.04  CALCIUM 8.3*   LFT Recent Labs    03/19/23 0927  PROT 6.4*  ALBUMIN 3.3*  AST 22  ALT  14  ALKPHOS 43  BILITOT 0.7  BILIDIR 0.1  IBILI 0.6   PT/INR Recent Labs    03/19/23 0744  LABPROT 15.2  INR 1.2    STUDIES: CT ANGIO GI BLEED Result Date: 03/19/2023 CLINICAL DATA:  BRBPR, h/o Crohn's disease and diverticulosis. EXAM: CTA ABDOMEN AND PELVIS WITHOUT AND WITH CONTRAST TECHNIQUE: Multidetector CT imaging of the abdomen and pelvis was performed using the standard protocol during bolus administration of intravenous contrast. Multiplanar reconstructed images and MIPs were obtained and reviewed to evaluate the vascular anatomy. RADIATION DOSE REDUCTION: This exam was performed according to the departmental dose-optimization program which includes automated exposure control, adjustment of the mA and/or kV according to patient size and/or use of iterative reconstruction technique. CONTRAST:  OMNIPAQUE IOHEXOL 350 MG/ML SOLN COMPARISON:  CT scan renal stone protocol from 06/17/2020 and MRI abdomen from 12/12/2020. 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 Recommend surveillance ultrasound in 3 years. Reference: Journal of Vascular Surgery 67.1 (2018): 2-77. J Am Coll Radiol [phone]. No aortic dissection, vasculitis, penetrating atherosclerotic ulcer or significant stenosis. Celiac: Patent without evidence of aneurysm, dissection, vasculitis or significant stenosis. SMA: Patent without evidence of aneurysm, dissection, vasculitis or significant stenosis. Renals: Both renal arteries are patent without evidence of aneurysm, dissection, vasculitis, fibromuscular dysplasia or significant stenosis. Accessory right renal artery noted, which is also patent. IMA: Patent without evidence of aneurysm, dissection, vasculitis or significant stenosis. Inflow: Patent without evidence of aneurysm, dissection, vasculitis or significant stenosis. Proximal Outflow:  Bilateral common femoral and visualized portions of the superficial and profunda femoral arteries are patent without evidence of aneurysm, dissection, vasculitis or significant stenosis. Veins: No obvious venous abnormality within the limitations of this arterial phase study. Review of the MIP images confirms the above findings. NON-VASCULAR Lower chest: There are patchy atelectatic changes in the visualized lung bases. No overt consolidation. No pleural effusion. There are also moderate paraseptal emphysematous changes in the visualized bilateral lungs. The heart is normal in  size. No pericardial effusion. Hepatobiliary: The liver is normal in size. Non-cirrhotic configuration. No suspicious mass. No intrahepatic or extrahepatic bile duct dilation. Small volume dependent gallstones noted without imaging signs of acute cholecystitis. Normal gallbladder wall thickness. No pericholecystic inflammatory changes. Pancreas: Unremarkable. No pancreatic ductal dilatation or surrounding inflammatory changes. Spleen: Within normal limits. No focal lesion. Adrenals/Urinary Tract: Adrenal glands are unremarkable. No suspicious renal mass. There are scattered simple cysts in bilateral kidneys with largest nearly completely exophytic cyst arising from the right kidney lower pole measuring up to 2.3 x 2.4 cm. No hydronephrosis. No renal or ureteric calculi. Unremarkable urinary bladder. Stomach/Bowel: Note is made of moderate-to-large hiatal hernia. No disproportionate dilation of the small or large bowel loops. The appendix was not visualized; however there is no acute inflammatory process in the right lower quadrant. There are innumerable diverticula throughout the colon. There is subtle pericolonic fat stranding and prominence of vasa recta throughout the colon without definite colonic wall thickening, concerning for subacute/early pancolitis, most likely infective/inflammatory in etiology. No active extravasation of  intravenous contrast noted to suggest active bleeding. Vascular/Lymphatic: No ascites or pneumoperitoneum. No abdominal or pelvic lymphadenopathy, by size criteria. There are moderate peripheral atherosclerotic vascular calcifications of the aorta and its major branches. Reproductive: Normal size prostate. Symmetric seminal vesicles. Other: There are fat containing umbilical and bilateral inguinal hernias. There are postsurgical changes in the bilateral inguinal region, from prior hernia repair. Musculoskeletal: No suspicious osseous lesions. There are mild multilevel degenerative changes in the visualized spine. IMPRESSION: 1. 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 There is subtle pericolonic fat stranding and prominence of vasa recta throughout the colon without definite wall thickening, concerning for subacute/early pancolitis, most likely infective/inflammatory in etiology. No active extravasation of intravenous contrast to suggest active bleeding. 2. 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 Recommend surveillance ultrasound in 3 years. Reference: Journal of Vascular Surgery 67.1 (2018): 2-77. J Am Coll Radiol 2013;10:789-794. 3. Multiple other nonacute observations, as described above. Aortic Atherosclerosis (ICD10-I70.0) and Emphysema (ICD10-J43.9). 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 Electronically Signed   By: Jules Schick M.D.   On: 03/19/2023 10:28      Impression / Plan:   Assessment: Principal Problem:   Acute GI bleeding   Kyle Holloway Holloway. is a 83 y.o. y/o male with a report of melanotic stools with a history of NSAIDs recently.  The patient also has a history of Crohn's colitis.  The patient is reporting to me that he has had only dark stools although in the emergency department they reported that the patient was having dark stools with bright red blood.  The patient denies any nausea  vomiting fevers or chills. His hemoglobin has been stable. Plan:  The patient will be set up for a upper endoscopy since he has been n.p.o. today.  If the upper endoscopy does not show a cause of his symptoms and because of the findings on the CT scan then a colonoscopy will be set up with the following day.  The patient and his wife have been explained the plan and agree with it.  The patient will be brought to the endoscopy unit for the EGD today.  Thank you for involving me in the care of this patient.      LOS: 0 days   Kyle Minium, MD, MD. Clementeen Graham 03/19/2023, 2:35 PM,  Pager [phone] 7am-5pm  Check AMION for 5pm -7am coverage and  on weekends   Note: This dictation was prepared with Dragon dictation along with smaller phrase technology. Any transcriptional errors that result from this process are unintentional.

## 2023-03-19 NOTE — Op Note (Signed)
[facility] Patient Name: Kyle Holloway Procedure Date: 03/19/2023 2:08 PM MRN: [id number] Account #: [id number] Date of Birth: 02/17/1940 Admit Type: Inpatient Age: 83 Room: [facility] ENDO ROOM 4 Gender: Male Note Status: Finalized Instrument Name: Upper Endoscope [id number] Procedure:             Upper GI endoscopy Indications:           Melena Providers:             Midge Minium MD, MD Referring MD:          No Local Md, MD (Referring MD) Medicines:             Propofol per Anesthesia Complications:         No immediate complications. Procedure:             Pre-Anesthesia Assessment:                        - Prior to the procedure, a History and Physical was                         performed, and patient medications and allergies were                         reviewed. The patient's tolerance of previous                         anesthesia was also reviewed. The risks and benefits                         of the procedure and the sedation options and risks                         were discussed with the patient. All questions were                         answered, and informed consent was obtained. Prior                         Anticoagulants: The patient has taken no anticoagulant                         or antiplatelet agents. ASA Grade Assessment: II - A                         patient with mild systemic disease. After reviewing                         the risks and benefits, the patient was deemed in                         satisfactory condition to undergo the procedure.                        After obtaining informed consent, the endoscope was                         passed under direct vision. Throughout the procedure,  the patient's blood pressure, pulse, and oxygen                         saturations were monitored continuously. The Endoscope                         was introduced through the mouth, and advanced to the                          second part of duodenum. The upper GI endoscopy was                         accomplished without difficulty. The patient tolerated                         the procedure well. Findings:      A large hiatal hernia with a single Cameron ulcer was found. Coagulation       for hemostasis using argon plasma at 2 liters/minute and 20 watts was       successful.      Mild inflammation characterized by erythema was found in the gastric       antrum.      The examined duodenum was normal. Impression:            - Large hiatal hernia with a single Cameron ulcer.                         Treated with argon plasma coagulation (APC).                        - Gastritis.                        - Normal examined duodenum.                        - No specimens collected. Recommendation:        - Clear liquid diet.                        - Continue present medications. Procedure Code(s):     --- Professional ---                        [phone], Esophagogastroduodenoscopy, flexible,                         transoral; with control of bleeding, any method Diagnosis Code(s):     --- Professional ---                        K92.1, Melena (includes Hematochezia)                        K25.9, Gastric ulcer, unspecified as acute or chronic,                         without hemorrhage or perforation CPT copyright 2022 American Medical Association. All rights reserved. The codes documented in this report are preliminary and upon coder review may  be revised to meet current compliance requirements. Midge Minium MD, MD  03/19/2023 2:33:17 PM This report has been signed electronically. Number of Addenda: 0 Note Initiated On: 03/19/2023 2:08 PM Estimated Blood Loss:  Estimated blood loss: none.      [facility]

## 2023-03-19 NOTE — Plan of Care (Signed)

## 2023-03-19 NOTE — ED Notes (Signed)
Pt used restroom, no difficulties with ambulation. Pt placed back on monitor.

## 2023-03-19 NOTE — ED Provider Notes (Signed)
[facility] Provider Note    Event Date/Time   First MD Initiated Contact with Patient 03/19/23 0701     (approximate)   History   Rectal Bleeding   HPI  Kyle MEDEIROS Sr. is a 83 y.o. male past medical history significant for Crohn's, who presents to the emergency department for bright red blood and dark black stools.  Started having multiple episodes at approximately 3:00 this morning and states that it has been occurring every 45 minutes.  Last episode was approximately 40 minutes ago.  Multiple episodes of bright red and dark red blood per rectum.  Also endorses some black stool.  States that this happened 1 time in the past approximately 7 years ago at [facility].  States that he is uncertain of what they found but he did not need a blood transfusion during that time.  Not on anticoagulation but does take a baby aspirin.  Denies any abdominal pain.  No nausea or vomiting.  Does state that he had a colonoscopy in the past and does not believe that they found anything.  Denies any history of liver issues.  No history of cirrhosis.  No history of alcohol use or significant NSAID use.  Was taking some NSAIDs with ibuprofen but states that his daughter told him to switch to Tylenol.     Physical Exam   Triage Vital Signs: ED Triage Vitals  Encounter Vitals Group     BP 03/19/23 0357 (!) 171/118     Systolic BP Percentile --      Diastolic BP Percentile --      Pulse Rate 03/19/23 0357 82     Resp 03/19/23 0357 20     Temp 03/19/23 0357 98.5 F (36.9 C)     Temp Source 03/19/23 0357 Oral     SpO2 03/19/23 0357 96 %     Weight 03/19/23 0356 204 lb (92.5 kg)     Height 03/19/23 0356 5\' 10"  (1.778 m)     Head Circumference --      Peak Flow --      Pain Score 03/19/23 0356 0     Pain Loc --      Pain Education --      Exclude from Growth Chart --     Most recent vital signs: Vitals:   03/19/23 0615 03/19/23 0630  BP:  131/82  Pulse: 60  73  Resp:  18  Temp:    SpO2: 100% 97%    Physical Exam Exam conducted with a chaperone present.  Constitutional:      Appearance: He is well-developed.  HENT:     Head: Atraumatic.  Eyes:     Conjunctiva/sclera: Conjunctivae normal.     Pupils: Pupils are equal, round, and reactive to light.  Cardiovascular:     Rate and Rhythm: Regular rhythm.  Pulmonary:     Effort: No respiratory distress.  Abdominal:     Tenderness: There is no abdominal tenderness.  Genitourinary:    Comments: Pad in place, dark red blood soaked on the pad, rectal exam with dark red blood mixed with melena Musculoskeletal:     Cervical back: Normal range of motion.  Skin:    General: Skin is warm.     Capillary Refill: Capillary refill takes less than 2 seconds.  Neurological:     Mental Status: He is alert. Mental status is at baseline.  Psychiatric:        Mood and  Affect: Mood normal.     IMPRESSION / MDM / ASSESSMENT AND PLAN / ED COURSE  I reviewed the triage vital signs and the nursing notes.  Differential diagnosis including upper GI bleed, lower GI bleed including AVM, diverticulosis, malignancy.   On chart review patient has a visit with Dr. Norma Fredrickson with gastroenterology in 2022.  During that visit noted that he had a history of Crohn's colitis in clinical remission.  Had no GI complaints at that time.  He was compliant at that time with balsalazide.  Last colonoscopy was 2014 that was consistent with Crohn's and diverticulosis of the entire colon.  Last EGD was in 2015 for dysphagia noted a normal esophagus, stomach and duodenum.  No tachycardic or bradycardic dysrhythmias while on cardiac telemetry.  RADIOLOGY CTA GI bleed ordered  LABS (all labs ordered are listed, but only abnormal results are displayed) Labs interpreted as -    Labs Reviewed  BASIC METABOLIC PANEL - Abnormal; Notable for the following components:      Result Value   CO2 16 (*)    Glucose, Bld 101 (*)     Calcium 8.3 (*)    All other components within normal limits  CBC WITH DIFFERENTIAL/PLATELET  HEPATIC FUNCTION PANEL  PROTIME-INR  APTT  TYPE AND SCREEN     MDM    Patient presents to the emergency department with multiple episodes of GI bleed.  Not on anticoagulation.  Hemodynamically stable.  Patient was typed and screened.  Initial hemoglobin was stable at 13.7, mild drop and appears to have a baseline around 15.  Normal BUN.  CO2 is low at 16 but has a normal anion gap, likely in the setting of multiple episodes of diarrhea.  No recent antibiotic use and have a low suspicion for C. difficile.  Patient did not endorse a history of Crohn's disease but on chart review does have a prior diagnosis of Crohn's and diverticulosis so will obtain CT scan to further evaluate.  Consulted the hospitalist for admission for significant lower GI bleed.  Patient was given IV fluids and IV Protonix.   PROCEDURES:  Critical Care performed: No  Procedures  Patient's presentation is most consistent with acute presentation with potential threat to life or bodily function.   MEDICATIONS ORDERED IN ED: Medications  lactated ringers bolus 1,000 mL (has no administration in time range)  pantoprazole (PROTONIX) injection 80 mg (has no administration in time range)    FINAL CLINICAL IMPRESSION(S) / ED DIAGNOSES   Final diagnoses:  Rectal bleeding  Acute GI bleeding     Rx / DC Orders   ED Discharge Orders     None        Note:  This document was prepared using Dragon voice recognition software and may include unintentional dictation errors.   Corena Herter, MD 03/19/23 [phone]

## 2023-03-19 NOTE — Transfer of Care (Signed)
Immediate Anesthesia Transfer of Care Note  Patient: Wyvonnia Lora Sr.  Procedure(s) Performed: ESOPHAGOGASTRODUODENOSCOPY (EGD)  Patient Location: PACU  Anesthesia Type:General  Level of Consciousness: sedated  Airway & Oxygen Therapy: Patient Spontanous Breathing  Post-op Assessment: Report given to RN and Post -op Vital signs reviewed and stable  Post vital signs: stable  Last Vitals:  Vitals Value Taken Time  BP 91/57 03/19/23 1436  Temp 35.6 C 03/19/23 1436  Pulse 95 03/19/23 1436  Resp 16 03/19/23 1436  SpO2 93 % 03/19/23 1436    Last Pain:  Vitals:   03/19/23 1436  TempSrc: Temporal  PainSc: Asleep         Complications: No notable events documented.

## 2023-03-19 NOTE — ED Triage Notes (Signed)
Pt reports rectal bleeding that began tonight, pt denies abd pain denies blood thinners.

## 2023-03-19 NOTE — ED Notes (Signed)
Pt ambulated to the bathroom with steady gait. Pt back into bed, wife at bedside.

## 2023-03-19 NOTE — H&P (Signed)
History and Physical    Patient: Kyle Holloway WUJ:[id number] DOB: [birth year] DOA: 03/19/2023 DOS: the patient was seen and examined on 03/19/2023 PCP: Kandyce Rud, MD  Patient coming from: Home  Chief Complaint:  Chief Complaint  Patient presents with   Rectal Bleeding   HPI: Kyle GOYER Sr. is a 83 y.o. male with medical history significant of Crohn's disease, COPD, myasthenia gravis presented to the emergency department with chief complaint of rectal bleeding.  Patient reports that he had at least 6 episodes of dark tarry stools since 2 AM this morning.  Patient does not report bright red blood.  Patient denies any chest pain, shortness of breath, palpitations.  Reports taking Motrin a week ago for pain but later started taking Tylenol.  Patient is not any blood thinners.  He reports prior episode of bright red blood attributed to Crohn's flareup several years ago.  Patient follows Dr. Norma Fredrickson and last colonoscopy 2022.  Patient is currently on balsalazide.  Denies any complaints of fever, chills, rigors.  In the emergency department, patient is hemodynamically stable, hemoglobin noted to be 13.7, last 1 more than a year ago 14.9.  CT GI bleed protocol ordered.  ED provider requested hospitalist admission for further management evaluation of GI bleed.  Review of Systems: As mentioned in the history of present illness. All other systems reviewed and are negative. Past Medical History:  Diagnosis Date   Arthritis    COPD (chronic obstructive pulmonary disease) (HCC)    Crohn's disease (HCC)    Emphysema, unspecified (HCC)    Hypertension    Myasthenia gravis (HCC)    Past Surgical History:  Procedure Laterality Date   JOINT REPLACEMENT Left    total knee replacement    Dr. 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 His smoking use included cigarettes. He started smoking about 125 years ago. He has a 142.5 pack-year smoking history. He has never used smokeless tobacco. He reports that he does not currently use alcohol. He reports that he does not use drugs.  No Known Allergies  Family History  Problem Relation Age of Onset   Brain cancer Father     Prior to Admission medications   Medication Sig Start Date End Date Taking? Authorizing Provider  acetaminophen (TYLENOL) 325 MG tablet Take 650 mg by mouth 3 (three) times daily as needed (pain).    [provider]  albuterol (PROVENTIL HFA;VENTOLIN HFA) 108 (90 Base) MCG/ACT inhaler Inhale 2 puffs into the lungs every 6 (six) hours as needed for wheezing or shortness of breath. Patient taking differently: Inhale 2 puffs into the lungs as needed for wheezing or shortness of breath. 12/29/17   Salary, Jetty Duhamel D, MD  aspirin EC 81 MG tablet Take 81 mg by mouth daily.     [provider]  balsalazide (COLAZAL) 750 MG capsule Take 750 mg by mouth 3 (three) times daily. 09/24/17   [provider]  Biotin 1 MG CAPS Take 1 mg by mouth daily.    [provider]  Bromfenac Sodium 0.09 % SOLN Apply to eye as directed. 09/28/21   [provider]  finasteride (PROSCAR) 5 MG tablet Take 5 mg by mouth  daily. 12/12/17   [provider]  folic acid (FOLVITE) 1 MG tablet Take 1 mg by mouth daily.  Patient not taking: Reported on 11/07/2021 07/06/14   [provider]  methotrexate (RHEUMATREX) 2.5 MG tablet Take 2 tablets (5 mg total) by mouth once a week. Every Wednesday  Caution:Chemotherapy. Protect from light. Patient taking differently: Take 10 mg by mouth once a week. 12/29/17   Salary, Evelena Asa, MD  Multiple Vitamins-Minerals (MULTIVITAMIN WITH MINERALS) tablet Take 1 tablet by mouth daily.    [provider]  Multiple Vitamins-Minerals (PRESERVISION AREDS PO) Take 1 tablet by mouth in the morning and at bedtime.    [provider]  omeprazole (PRILOSEC) 20 MG capsule Take 20 mg by mouth 2 (two) times daily. 09/06/21   [provider]  prednisoLONE acetate (PRED FORTE) 1 % ophthalmic suspension Place 1 drop into the left eye 2 (two) times daily. 11/02/21   [provider]  pyridostigmine (MESTINON) 60 MG tablet Take 0.5 tablets (30 mg total) by mouth every 8 (eight) hours. Patient taking differently: Take 60 mg by mouth 3 (three) times daily. 12/29/17   Salary, Jetty Duhamel D, MD  tamsulosin (FLOMAX) 0.4 MG CAPS capsule Take 0.4 mg by mouth daily. 12/12/17   [provider]  valACYclovir (VALTREX) 500 MG tablet Take 500 mg by mouth daily. 11/04/21   [provider]  vitamin B-12 (CYANOCOBALAMIN) 500 MCG tablet Take 500 mcg by mouth daily.     [provider]    Physical Exam: Vitals:   03/19/23 0357 03/19/23 0610 03/19/23 0615 03/19/23 0630  BP: (!) 171/118 (!) 148/86  131/82  Pulse: 82  60 73  Resp: 20   18  Temp: 98.5 F (36.9 C)     TempSrc: Oral     SpO2: 96%  100% 97%  Weight:      Height:       General - Elderly Caucasian male, no apparent distress HEENT - PERRLA, EOMI, atraumatic head, non tender sinuses. Lung - Clear, rales, rhonchi, wheezes. Heart - S1, S2 heard, no murmurs, rubs, no pedal edema. Abdomen-soft, nontender, nondistended, bowel sounds good Neuro - Alert, awake and oriented x 3, non focal exam. Skin - Warm and dry. Data Reviewed:     Latest Ref Rng & Units 03/19/2023    3:58 AM 11/07/2021    2:30 PM 06/17/2020   10:02 AM  CBC  WBC 4.0 - 10.5 K/uL 8.0  6.8  5.7   Hemoglobin 13.0 - 17.0 g/dL 16.1  09.6  04.5   Hematocrit 39.0 - 52.0 % 41.5  44.8  44.7   Platelets 150 - 400 K/uL 292  285  302       Latest Ref Rng & Units 03/19/2023    3:58 AM 11/07/2021    2:30 PM 06/17/2020   10:02 AM  BMP  Glucose 70  - 99 mg/dL 409  811  89   BUN 8 - 23 mg/dL 19  17  13    Creatinine 0.61 - 1.24 mg/dL 9.14  7.82  9.56   Sodium 135 - 145 mmol/L 137  139  138   Potassium 3.5 - 5.1 mmol/L 3.8  4.8  4.2   Chloride 98 - 111 mmol/L 108  109  102   CO2 22 - 32 mmol/L 16  25  25    Calcium 8.9 - 10.3 mg/dL 8.3  9.0  9.3    No results found.   Assessment and Plan:  Wyvonnia Lora Sr. is a 83 y.o. male with medical history significant of Crohn's disease, COPD, myasthenia gravis presented to the emergency department with chief complaint of rectal bleeding.  Patient complained of dark stools, no abdominal discomfort and no bright red blood per rectum. Hemoglobin stable, GI consulted.  Plan: Melena Upper GI bleed Admit the patient to medicine, telemetry under observation service. Gentle IVF fluids. N.p.o. for now. Hold aspirin. Will give IV Protonix. GI evaluation called.  Crohn's disease: Resume balsalazide once able to tolerate diet.  Myasthenia gravis: Patient is on pyridostigmine therapy.  COPD: No exacerbation.  Bronchodilators as needed.  BPH: Continue finasteride, tamsulosin therapy once diet resumed.   Advance Care Planning:   Code Status: Full Code discussed with patient and wife at bedside.    Consults: GI Dr. Servando Snare  Family Communication: Patient's wife at bedside understand agree with the current plan  Severity of Illness: The appropriate patient status for this patient is OBSERVATION. Observation status is judged to be reasonable and necessary in order to provide the required intensity of service to ensure the patient's safety. The patient's presenting symptoms, physical exam findings, and initial radiographic and laboratory data in the context of their medical condition is felt to place them at decreased risk for further clinical deterioration. Furthermore, it is anticipated that the patient will be medically stable for discharge from the hospital within 2 midnights of admission.    Author: Marcelino Duster, MD 03/19/2023 8:09 AM  For on call review [email or web address].

## 2023-03-20 ENCOUNTER — Observation Stay: Payer: Medicare Other | Admitting: Anesthesiology

## 2023-03-20 ENCOUNTER — Encounter: Payer: Self-pay | Admitting: Internal Medicine

## 2023-03-20 ENCOUNTER — Observation Stay: Payer: Medicare Other

## 2023-03-20 ENCOUNTER — Encounter
Admission: EM | Disposition: A | Discharge status: Home / Self Care | Payer: Self-pay | Source: Home / Self Care | Attending: Internal Medicine

## 2023-03-20 DIAGNOSIS — K5731 Diverticulosis of large intestine without perforation or abscess with bleeding: Secondary | ICD-10-CM

## 2023-03-20 DIAGNOSIS — K625 Hemorrhage of anus and rectum: Secondary | ICD-10-CM

## 2023-03-20 DIAGNOSIS — K922 Gastrointestinal hemorrhage, unspecified: Secondary | ICD-10-CM | POA: Diagnosis not present

## 2023-03-20 DIAGNOSIS — Z79899 Other long term (current) drug therapy: Secondary | ICD-10-CM | POA: Diagnosis not present

## 2023-03-20 DIAGNOSIS — Z7982 Long term (current) use of aspirin: Secondary | ICD-10-CM | POA: Diagnosis not present

## 2023-03-20 DIAGNOSIS — J439 Emphysema, unspecified: Secondary | ICD-10-CM | POA: Diagnosis present

## 2023-03-20 DIAGNOSIS — Z515 Encounter for palliative care: Secondary | ICD-10-CM | POA: Diagnosis not present

## 2023-03-20 DIAGNOSIS — K5010 Crohn's disease of large intestine without complications: Secondary | ICD-10-CM | POA: Diagnosis present

## 2023-03-20 DIAGNOSIS — E876 Hypokalemia: Secondary | ICD-10-CM | POA: Diagnosis present

## 2023-03-20 DIAGNOSIS — I493 Ventricular premature depolarization: Secondary | ICD-10-CM | POA: Diagnosis present

## 2023-03-20 DIAGNOSIS — K449 Diaphragmatic hernia without obstruction or gangrene: Secondary | ICD-10-CM | POA: Diagnosis present

## 2023-03-20 DIAGNOSIS — I10 Essential (primary) hypertension: Secondary | ICD-10-CM | POA: Diagnosis present

## 2023-03-20 DIAGNOSIS — J449 Chronic obstructive pulmonary disease, unspecified: Secondary | ICD-10-CM | POA: Diagnosis not present

## 2023-03-20 DIAGNOSIS — Z808 Family history of malignant neoplasm of other organs or systems: Secondary | ICD-10-CM | POA: Diagnosis not present

## 2023-03-20 DIAGNOSIS — K259 Gastric ulcer, unspecified as acute or chronic, without hemorrhage or perforation: Secondary | ICD-10-CM | POA: Diagnosis present

## 2023-03-20 DIAGNOSIS — E785 Hyperlipidemia, unspecified: Secondary | ICD-10-CM

## 2023-03-20 DIAGNOSIS — Z87891 Personal history of nicotine dependence: Secondary | ICD-10-CM | POA: Diagnosis not present

## 2023-03-20 DIAGNOSIS — D500 Iron deficiency anemia secondary to blood loss (chronic): Secondary | ICD-10-CM | POA: Diagnosis not present

## 2023-03-20 DIAGNOSIS — Z96651 Presence of right artificial knee joint: Secondary | ICD-10-CM | POA: Diagnosis present

## 2023-03-20 DIAGNOSIS — D62 Acute posthemorrhagic anemia: Secondary | ICD-10-CM | POA: Diagnosis present

## 2023-03-20 DIAGNOSIS — K921 Melena: Secondary | ICD-10-CM | POA: Diagnosis not present

## 2023-03-20 DIAGNOSIS — K2970 Gastritis, unspecified, without bleeding: Secondary | ICD-10-CM | POA: Diagnosis present

## 2023-03-20 DIAGNOSIS — G7000 Myasthenia gravis without (acute) exacerbation: Secondary | ICD-10-CM | POA: Diagnosis present

## 2023-03-20 DIAGNOSIS — N400 Enlarged prostate without lower urinary tract symptoms: Secondary | ICD-10-CM | POA: Diagnosis present

## 2023-03-20 HISTORY — PX: COLONOSCOPY WITH PROPOFOL: SHX5780

## 2023-03-20 LAB — BASIC METABOLIC PANEL
Anion gap: 7 (ref 5–15)
BUN: 14 mg/dL (ref 8–23)
CO2: 21 mmol/L — ABNORMAL LOW (ref 22–32)
Calcium: 7.8 mg/dL — ABNORMAL LOW (ref 8.9–10.3)
Chloride: 110 mmol/L (ref 98–111)
Creatinine, Ser: 0.85 mg/dL (ref 0.61–1.24)
GFR, Estimated: >60 mL/min (ref >60)
Glucose, Bld: 95 mg/dL (ref 70–99)
Potassium: 3.5 mmol/L (ref 3.5–5.1)
Sodium: 138 mmol/L (ref 135–145)

## 2023-03-20 LAB — HEMOGLOBIN AND HEMATOCRIT, BLOOD
HCT: 26.6 % — ABNORMAL LOW (ref 39.0–52.0)
HCT: 26.3 % — ABNORMAL LOW (ref 39.0–52.0)
HCT: 24.3 % — ABNORMAL LOW (ref 39.0–52.0)
Hemoglobin: 9.3 g/dL — ABNORMAL LOW (ref 13.0–17.0)
Hemoglobin: 9 g/dL — ABNORMAL LOW (ref 13.0–17.0)
Hemoglobin: 8.1 g/dL — ABNORMAL LOW (ref 13.0–17.0)

## 2023-03-20 SURGERY — COLONOSCOPY WITH PROPOFOL
Anesthesia: General

## 2023-03-20 MED ORDER — PANTOPRAZOLE SODIUM 40 MG PO TBEC: 40.0000 mg | DELAYED_RELEASE_TABLET | Freq: Two times a day (BID) | ORAL | Status: DC

## 2023-03-20 MED ORDER — ONDANSETRON HCL 4 MG/2ML IJ SOLN
INTRAMUSCULAR | Status: DC | PRN
  Administered 2023-03-20: 4 mg via INTRAVENOUS

## 2023-03-20 MED ORDER — PROPOFOL 10 MG/ML IV BOLUS
INTRAVENOUS | Status: DC | PRN
  Administered 2023-03-20: 70 mg via INTRAVENOUS
  Administered 2023-03-20: 30 mg via INTRAVENOUS

## 2023-03-20 MED ORDER — MORPHINE SULFATE (PF) 2 MG/ML IV SOLN: 1.0000 mg | INTRAVENOUS | Status: DC | PRN

## 2023-03-20 MED ORDER — TECHNETIUM TC 99M-LABELED RED BLOOD CELLS IV KIT
23.3100 | PACK | Freq: Once | INTRAVENOUS | Status: AC | PRN
  Administered 2023-03-20: 23.31 via INTRAVENOUS

## 2023-03-20 MED ORDER — PROPOFOL 500 MG/50ML IV EMUL
INTRAVENOUS | Status: DC | PRN
  Administered 2023-03-20: 120 ug/kg/min via INTRAVENOUS

## 2023-03-20 MED ORDER — LIDOCAINE 2% (20 MG/ML) 5 ML SYRINGE
INTRAMUSCULAR | Status: DC | PRN
  Administered 2023-03-20: 20 mg via INTRAVENOUS

## 2023-03-20 MED ORDER — PANTOPRAZOLE SODIUM 40 MG IV SOLR
40.0000 mg | Freq: Two times a day (BID) | INTRAVENOUS | Status: DC
  Administered 2023-03-20 – 2023-03-22 (×4): 40 mg via INTRAVENOUS
  Filled 2023-03-20 (×4): qty 10

## 2023-03-20 NOTE — Consult Note (Signed)
MRN : [id number]  Kyle WEHRLY Sr. is a 83 y.o. (April 01, 1940) male who presents with chief complaint of GI bleed.  History of Present Illness:   I am asked to evaluate the patient by Dr. Sherryll Burger.    Patient is an 83 year old gentleman with a history of diverticulosis who presented several days ago to [facility] with bloody bowel movements.  At this time he has had multiple bowel movements but they seem to have tapered off.  Workup has included a CT angiogram which was negative for bleed but did demonstrate pandiverticulosis.  He has also had a colonoscopy which demonstrates significant residual blood in the colon but no active bleeding site again confirming pandiverticulosis.  A nuclear scan obtained today is suggestive of a hepatic flexure bleed.  His initial hemoglobin was approximately 13 subsequently decreased to 11 and was noted to be 9 this morning but then repeat later on today it remained 9.  Patient does have a history approximately 8 to 10 years ago of a diverticular bleed as well which stopped spontaneously.  Currently he is undergoing a capsule endoscopy.  Current Meds  Medication Sig   acetaminophen (TYLENOL) 325 MG tablet Take 650 mg by mouth 3 (three) times daily as needed (pain).   albuterol (PROVENTIL HFA;VENTOLIN HFA) 108 (90 Base) MCG/ACT inhaler Inhale 2 puffs into the lungs every 6 (six) hours as needed for wheezing or shortness of breath.   aspirin EC 81 MG tablet Take 81 mg by mouth daily.    atenolol (TENORMIN) 25 MG tablet Take 1 tablet by mouth daily.   balsalazide (COLAZAL) 750 MG capsule Take 750 mg by mouth 3 (three) times daily.   Biotin 1 MG CAPS Take 1 mg by mouth daily.   cholecalciferol (VITAMIN D3) 25 MCG (1000 UNIT) tablet Take 2,000 Units by mouth daily.   finasteride (PROSCAR) 5 MG tablet Take 5 mg by mouth daily.   folic acid (FOLVITE) 1 MG tablet Take 1 mg by  mouth daily.   methotrexate (RHEUMATREX) 2.5 MG tablet Take 2 tablets (5 mg total) by mouth once a week. Every Wednesday  Caution:Chemotherapy. Protect from light. (Patient taking differently: Take 10 mg by mouth once a week. On Wednesday.)   Multiple Vitamins-Minerals (MULTIVITAMIN WITH MINERALS) tablet Take 1 tablet by mouth daily.   Multiple Vitamins-Minerals (PRESERVISION AREDS PO) Take 2 tablets by mouth daily.   Omega-3 Fatty Acids (FISH OIL) 1000 MG CAPS Take by mouth.   omeprazole (PRILOSEC) 20 MG capsule Take 40 mg by mouth daily.   prednisoLONE acetate (PRED FORTE) 1 % ophthalmic suspension Place 1 drop into the left eye 2 (two) times daily.   pyridostigmine (MESTINON) 60 MG tablet Take 0.5 tablets (30 mg total) by mouth every 8 (eight) hours. (Patient taking differently: Take 60 mg by mouth 3 (three) times daily. See Rx fill hx and office visit note 01/18/23)   tamsulosin (FLOMAX) 0.4 MG CAPS capsule Take 0.4 mg by mouth daily.   vitamin B-12 (CYANOCOBALAMIN) 500 MCG tablet Take 1,000 mcg by mouth daily.    Past Medical History:  Diagnosis Date   Arthritis    COPD (chronic obstructive pulmonary disease) (HCC)    Crohn's disease (HCC)    Emphysema, unspecified (HCC)    Hypertension    Myasthenia gravis (HCC)     Past Surgical History:  Procedure Laterality Date   JOINT REPLACEMENT Left    total knee replacement    Dr. Ernest Pine   TONSILLECTOMY     TOTAL KNEE ARTHROPLASTY Right 11/20/2021   Procedure: RIGHT TOTAL KNEE ARTHROPLASTY;  Surgeon: Gean Birchwood, MD;  Location: [facility];  Service: Orthopedics;  Laterality: Right;   WISDOM TOOTH EXTRACTION      Social History Social History   Tobacco Use   Smoking status: Former    Current packs/day: 0.00    Average packs/day: 1.5 packs/day for 95.0 years (142.5 ttl pk-yrs)    Types: Cigarettes    Start date: 19    Quit date: 73    Years since quitting: 30.1   Smokeless tobacco: Never  Vaping Use   Vaping status: Never  Used  Substance Use Topics   Alcohol use: Not Currently   Drug use: Never    Family History Family History  Problem Relation Age of Onset   Brain cancer Father     No Known Allergies   REVIEW OF SYSTEMS (Negative unless checked)  Constitutional: [] Weight loss  [] Fever  [] Chills Cardiac: [] Chest pain   [] Chest pressure   [] Palpitations   [] Shortness of breath when laying flat   [] Shortness of breath with exertion. Vascular:  [x] Pain in legs with walking   [] Pain in legs at rest  [] History of DVT   [] Phlebitis   [] Swelling in legs   [] Varicose veins   [] Non-healing ulcers Pulmonary:   [] Uses home oxygen   [] Productive cough   [] Hemoptysis   [] Wheeze  [] COPD   [] Asthma Neurologic:  [] Dizziness   [] Seizures   [] History of stroke   [] History of TIA  [] Aphasia   [] Vissual changes   [] Weakness or numbness in arm   [] Weakness or numbness in leg Musculoskeletal:   [] Joint swelling   [] Joint pain   [] Low back pain Hematologic:  [] Easy bruising  [] Easy bleeding   [] Hypercoagulable state   [] Anemic Gastrointestinal:  [] Diarrhea   [] Vomiting  [] Gastroesophageal reflux/heartburn   [] Difficulty swallowing. Genitourinary:  [] Chronic kidney disease   [] Difficult urination  [] Frequent urination   [] Blood in urine Skin:  [] Rashes   [] Ulcers  Psychological:  [] History of anxiety   []  History of major depression.  Physical Examination  Vitals:   03/20/23 1509 03/20/23 1523 03/20/23 1535 03/20/23 1609  BP: (!) 145/97 131/89 133/88 130/78  Pulse:    87  Resp:    16  Temp:    97.6 F (36.4 C)  TempSrc:    Oral  SpO2:    93%  Weight:      Height:       Body mass index is 29.26 kg/m. Gen: WD/WN, NAD Head: [place]/AT, No temporalis wasting.  Ear/Nose/Throat: Hearing grossly intact, nares w/o erythema or drainage Eyes: PER, EOMI, sclera nonicteric.  Neck: Supple, no masses.  No bruit or JVD.  Pulmonary:  Good air movement, no audible wheezing, no use of accessory muscles.  Cardiac: RRR, normal S1,  S2, no Murmurs. Vascular:   Vessel Right Left  Radial Palpable Palpable  Gastrointestinal: soft, non-distended. No guarding/no peritoneal signs.  Musculoskeletal: M/S 5/5 throughout.  No visible deformity.  Neurologic: CN 2-12 intact. Pain and light touch intact in extremities.  Symmetrical.  Speech is fluent. Motor exam as listed above. Psychiatric: Judgment intact, Mood & affect appropriate for pt's clinical situation. Dermatologic: No rashes or ulcers noted.  No changes consistent with cellulitis.   CBC Lab Results  Component Value Date   WBC 8.0 03/19/2023   HGB 9.0 (L) 03/20/2023   HCT 26.3 (L) 03/20/2023   MCV 97.2 03/19/2023   PLT 292 03/19/2023    BMET    Component Value Date/Time   NA 138 03/20/2023 0258   NA 137 06/17/2013 0548   K 3.5 03/20/2023 0258   K 4.3 06/17/2013 0548   CL 110 03/20/2023 0258   CL 110 (H) 06/17/2013 0548   CO2 21 (L) 03/20/2023 0258   CO2 21 06/17/2013 0548   GLUCOSE 95 03/20/2023 0258   GLUCOSE 147 (H) 06/17/2013 0548   BUN 14 03/20/2023 0258   BUN 17 06/17/2013 0548   CREATININE 0.85 03/20/2023 0258   CREATININE 0.94 06/17/2013 0548   CALCIUM 7.8 (L) 03/20/2023 0258   CALCIUM 8.4 (L) 06/17/2013 0548   GFRNONAA >60 03/20/2023 0258   GFRNONAA >60 06/17/2013 0548   GFRAA >60 01/02/2018 1011   GFRAA >60 06/17/2013 0548   Estimated Creatinine Clearance: 76.6 mL/min (by C-G formula based on SCr of 0.85 mg/dL).  COAG Lab Results  Component Value Date   INR 1.2 03/19/2023   INR 1.1 06/16/2013    Radiology NM GI Blood Loss Result Date: 03/20/2023 CLINICAL DATA:  Patient presents with bright red blood per rectum and dark stools. Anemia. EXAM: NUCLEAR MEDICINE GASTROINTESTINAL BLEEDING SCAN TECHNIQUE: Sequential abdominal images were obtained following intravenous administration of Tc-7m labeled red blood cells. RADIOPHARMACEUTICALS:  23.21 mCi Tc-31m pertechnetate in-vitro labeled red cells. COMPARISON:  03/19/2023 FINDINGS: During  the first hour of imaging there is a focal area of increased radiotracer uptake localizing to the hepatic flexure. During the second hour of imaging radiotracer activity increases in intensity and exhibits antegrade progression along the transverse colon up to the sigmoid colon. IMPRESSION: 1. Examination is positive for active colonic bleeding. The site of bleeding originates at the level of the hepatic flexure/distal ascending colon. Electronically Signed   By: Signa Kell M.D.   On: 03/20/2023 15:55   CT ANGIO GI BLEED Result Date: 03/19/2023 CLINICAL DATA:  BRBPR, h/o Crohn's disease and diverticulosis. EXAM: CTA ABDOMEN AND PELVIS WITHOUT AND WITH CONTRAST TECHNIQUE: Multidetector CT imaging of the abdomen and pelvis was performed using the standard protocol during bolus administration of intravenous contrast. Multiplanar reconstructed images and MIPs were obtained and reviewed to evaluate the vascular anatomy. RADIATION DOSE REDUCTION: This exam was performed according to the departmental dose-optimization program which includes automated exposure control, adjustment of the mA and/or kV according to patient size and/or use of iterative reconstruction technique. CONTRAST:  OMNIPAQUE IOHEXOL 350 MG/ML SOLN COMPARISON:  CT scan renal stone protocol from 06/17/2020 and MRI abdomen from 12/12/2020. 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 Recommend surveillance ultrasound in 3 years. Reference: Journal of Vascular Surgery 67.1 (2018): 2-77. J Am Coll Radiol [phone]. No aortic dissection, vasculitis, penetrating atherosclerotic ulcer or significant stenosis. Celiac: Patent without evidence of aneurysm, dissection, vasculitis or significant stenosis. SMA: Patent without evidence of aneurysm, dissection, vasculitis or significant stenosis. Renals: Both renal arteries are  patent without evidence of aneurysm, dissection, vasculitis, fibromuscular dysplasia or significant stenosis. Accessory right renal artery noted, which is also  patent. IMA: Patent without evidence of aneurysm, dissection, vasculitis or significant stenosis. Inflow: Patent without evidence of aneurysm, dissection, vasculitis or significant stenosis. Proximal Outflow: Bilateral common femoral and visualized portions of the superficial and profunda femoral arteries are patent without evidence of aneurysm, dissection, vasculitis or significant stenosis. Veins: No obvious venous abnormality within the limitations of this arterial phase study. Review of the MIP images confirms the above findings. NON-VASCULAR Lower chest: There are patchy atelectatic changes in the visualized lung bases. No overt consolidation. No pleural effusion. There are also moderate paraseptal emphysematous changes in the visualized bilateral lungs. The heart is normal in size. No pericardial effusion. Hepatobiliary: The liver is normal in size. Non-cirrhotic configuration. No suspicious mass. No intrahepatic or extrahepatic bile duct dilation. Small volume dependent gallstones noted without imaging signs of acute cholecystitis. Normal gallbladder wall thickness. No pericholecystic inflammatory changes. Pancreas: Unremarkable. No pancreatic ductal dilatation or surrounding inflammatory changes. Spleen: Within normal limits. No focal lesion. Adrenals/Urinary Tract: Adrenal glands are unremarkable. No suspicious renal mass. There are scattered simple cysts in bilateral kidneys with largest nearly completely exophytic cyst arising from the right kidney lower pole measuring up to 2.3 x 2.4 cm. No hydronephrosis. No renal or ureteric calculi. Unremarkable urinary bladder. Stomach/Bowel: Note is made of moderate-to-large hiatal hernia. No disproportionate dilation of the small or large bowel loops. The appendix was not visualized; however there is no  acute inflammatory process in the right lower quadrant. There are innumerable diverticula throughout the colon. There is subtle pericolonic fat stranding and prominence of vasa recta throughout the colon without definite colonic wall thickening, concerning for subacute/early pancolitis, most likely infective/inflammatory in etiology. No active extravasation of intravenous contrast noted to suggest active bleeding. Vascular/Lymphatic: No ascites or pneumoperitoneum. No abdominal or pelvic lymphadenopathy, by size criteria. There are moderate peripheral atherosclerotic vascular calcifications of the aorta and its major branches. Reproductive: Normal size prostate. Symmetric seminal vesicles. Other: There are fat containing umbilical and bilateral inguinal hernias. There are postsurgical changes in the bilateral inguinal region, from prior hernia repair. Musculoskeletal: No suspicious osseous lesions. There are mild multilevel degenerative changes in the visualized spine. IMPRESSION: 1. 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 There is subtle pericolonic fat stranding and prominence of vasa recta throughout the colon without definite wall thickening, concerning for subacute/early pancolitis, most likely infective/inflammatory in etiology. No active extravasation of intravenous contrast to suggest active bleeding. 2. 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 Recommend surveillance ultrasound in 3 years. Reference: Journal of Vascular Surgery 67.1 (2018): 2-77. J Am Coll Radiol 2013;10:789-794. 3. Multiple other nonacute observations, as described above. Aortic Atherosclerosis (ICD10-I70.0) and Emphysema (ICD10-J43.9). 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 Electronically Signed   By: Jules Schick M.D.   On: 03/19/2023 10:28     Assessment/Plan GI bleed: I had a long discussion with the patient and several family members  that are present.  Given the current findings he may actually have stopped bleeding.  This would be the natural course for a diverticular bleed.  I will hold off on embolization at this time.  We will continue to trend his H&H.  If tomorrow he has dropped significantly then proceeding with embolization of the hepatic flexure would be recommended.  I discussed this with the patient including the risks and benefits.  He agrees with this plan.  He can have clear liquids this evening n.p.o. after midnight and we will plan to  reassess his bleeding status in the morning and possible embolization.  2.  COPD: Continue pulmonary medications and aerosols as already ordered, these medications have been reviewed and there are no changes at this time.    3.  Essential hypertension:  Continue antihypertensive medications as already ordered, these medications have been reviewed and there are no changes at this time.   4.  Hyperlipidemia: Continue statin as ordered and reviewed, no changes at this time   Levora Dredge, MD  03/20/2023 6:44 PM

## 2023-03-20 NOTE — Plan of Care (Signed)
Problem: Clinical Measurements: Goal: Will remain free from infection Outcome: Progressing   Problem: Clinical Measurements: Goal: Respiratory complications will improve Outcome: Progressing   Problem: Clinical Measurements: Goal: Cardiovascular complication will be avoided Outcome: Progressing   Problem: Activity: Goal: Risk for activity intolerance will decrease Outcome: Progressing   Problem: Coping: Goal: Level of anxiety will decrease Outcome: Progressing

## 2023-03-20 NOTE — Anesthesia Postprocedure Evaluation (Signed)
Anesthesia Post Note  Patient: Kyle SILGUERO Sr.  Procedure(s) Performed: ESOPHAGOGASTRODUODENOSCOPY (EGD)  Patient location during evaluation: Endoscopy Anesthesia Type: General Level of consciousness: awake and alert Pain management: pain level controlled Vital Signs Assessment: post-procedure vital signs reviewed and stable Respiratory status: spontaneous breathing, nonlabored ventilation, respiratory function stable and patient connected to nasal cannula oxygen Cardiovascular status: blood pressure returned to baseline and stable Postop Assessment: no apparent nausea or vomiting Anesthetic complications: no   No notable events documented.   Last Vitals:  Vitals:   03/20/23 1418 03/20/23 1441  BP: (!) 140/87 99/69  Pulse:    Resp:    Temp:    SpO2:      Last Pain:  Vitals:   03/20/23 1202  TempSrc: Oral  PainSc:                  Cleda Mccreedy Cristopher Ciccarelli

## 2023-03-20 NOTE — Op Note (Signed)
[facility] Gastroenterology Patient Name: Kyle Holloway Procedure Date: 03/20/2023 8:57 AM MRN: [id number] Account #: [id number] Date of Birth: 03-08-1940 Admit Type: Inpatient Age: 83 Room: [facility] ENDO ROOM 1 Gender: Male Note Status: Finalized Instrument Name: Prentice Docker [id number] Procedure:             Colonoscopy Indications:           Hematochezia Providers:             Midge Minium MD, MD Referring MD:          Hassell Halim MD (Referring MD) Medicines:             Propofol per Anesthesia Complications:         No immediate complications. Procedure:             Pre-Anesthesia Assessment:                        - Prior to the procedure, a History and Physical was                         performed, and patient medications and allergies were                         reviewed. The patient's tolerance of previous                         anesthesia was also reviewed. The risks and benefits                         of the procedure and the sedation options and risks                         were discussed with the patient. All questions were                         answered, and informed consent was obtained. Prior                         Anticoagulants: The patient has taken no anticoagulant                         or antiplatelet agents. ASA Grade Assessment: II - A                         patient with mild systemic disease. After reviewing                         the risks and benefits, the patient was deemed in                         satisfactory condition to undergo the procedure.                        After obtaining informed consent, the colonoscope was                         passed under direct vision. Throughout the procedure,  the patient's blood pressure, pulse, and oxygen                         saturations were monitored continuously. The                         Colonoscope was introduced through the anus and                          advanced to the the cecum, identified by appendiceal                         orifice and ileocecal valve. The colonoscopy was                         performed without difficulty. The patient tolerated                         the procedure well. The quality of the bowel                         preparation was excellent. Findings:      The perianal and digital rectal examinations were normal.      Red blood was found in the entire colon.      No colitis seen anywhere in the colon. Impression:            - Blood in the entire examined colon.                        - No specimens collected.                        - The blood was more fresh on the left but seen                         throughout. Recommendation:        - Return patient to hospital ward for ongoing care.                        - Resume previous diet.                        - CT angio vs red ceel scan if further bleeding.                        IR vs vasular surgery for embolization if rebleeding                         occurs. Procedure Code(s):     --- Professional ---                        [phone], Colonoscopy, flexible; diagnostic, including                         collection of specimen(s) by brushing or washing, when                         performed (separate procedure) Diagnosis Code(s):     ---  Professional ---                        K92.1, Melena (includes Hematochezia) CPT copyright 2022 American Medical Association. All rights reserved. The codes documented in this report are preliminary and upon coder review may  be revised to meet current compliance requirements. Midge Minium MD, MD 03/20/2023 11:07:07 AM This report has been signed electronically. Number of Addenda: 0 Note Initiated On: 03/20/2023 8:57 AM Scope Withdrawal Time: 0 hours 15 minutes 54 seconds  Total Procedure Duration: 0 hours 23 minutes 59 seconds  Estimated Blood Loss:  Estimated blood loss: none.      [facility]

## 2023-03-20 NOTE — Progress Notes (Signed)
This patient had a colonoscopy today with innumerable diverticuli seen throughout the entire colon.  The colon was also filled with blood with more fresh blood appearing on the left side.  There was no sign of any colitis and the bleeding appeared to be a diverticular bleed. Due to the patient's continued bleeding I have had him swallow a capsule to make sure there is no sign of any small intestinal bleeding.  I have recommended interventional radiology versus vascular surgery if any further bleeding occurs for embolization.  A repeat colonoscopy is not recommended as a mode to stop this patient's likely diverticular bleeding.

## 2023-03-20 NOTE — OR Nursing (Signed)
Went to pt room to reapply capsule study belt. Pt left the floor for a procedure, came back, belt had been removed and sitting in pt bed. Unsure how long the belt has been off the pt. Reapplied approx 1610.

## 2023-03-20 NOTE — Progress Notes (Signed)
1      PROGRESS NOTE    Kyle Holloway  UEA:[id number] DOB: 06-Dec-1940 DOA: 03/19/2023 PCP: Kandyce Rud, MD    Brief Narrative:   83 y.o. male with medical history significant of Crohn's disease, COPD, myasthenia gravis admitted for GI bleed  2/11: GI consult, EGD showed large hiatal hernia with single Cameron ulcer status post APC, gastritis 2/12: C-scope showed multiple diverticuli, colon filled with blood with more fresh blood on the left side pending capsule study.  If further bleeding may need GI bleeding scan   Assessment & Plan:   Principal Problem:   Acute GI bleeding Active Problems:   Rectal bleeding   GI bleeding   Melena Lower/upper GI bleed Continue IV fluids N.p.o. for now. Hold aspirin. Continue IV Protonix EGD showed large hiatal hernia with single Cameron ulcer status post APC, gastritis C-scope showed multiple diverticuli, colon filled with blood with more fresh blood on the left side pending capsule study.  If further bleeding may need GI bleeding scan  Acute blood loss anemia due to GI bleed Will transfuse if hemoglobin drops less than 7 Monitor H&H Avoid aspirin or any blood thinners    Latest Ref Rng & Units 03/20/2023   10:00 AM 03/20/2023    2:58 AM 03/19/2023    6:39 PM  CBC  Hemoglobin 13.0 - 17.0 g/dL 9.0  9.3  47.8   Hematocrit 39.0 - 52.0 % 26.3  26.6  32.9      Crohn's disease: Resume balsalazide once able to tolerate diet.   Myasthenia gravis: Resume pyridostigmine therapy once able to take p.o.   COPD: No exacerbation.  Bronchodilators as needed.   BPH: Continue finasteride, tamsulosin therapy once diet resumed.   DVT prophylaxis: SCD's SCDs Start: 03/19/23 1806     Code Status: (Full code Family Communication: Updated daughter and wife at bedside Disposition Plan: Possible discharge to home in next 1 to 2 days depending on clinical condition and GI workup for bleed   Consultants:  GI  Procedures:  EGD and  colonoscopy    Subjective:  Had another large rectal bleed this morning.  Waiting for colonoscopy.  Daughter and wife at bedside.  Patient is hard of hearing  Objective: Vitals:   03/20/23 1202 03/20/23 1321 03/20/23 1336 03/20/23 1355  BP: 121/76 117/79 118/77 130/70  Pulse: 69     Resp: 16     Temp: (!) 97.5 F (36.4 C)     TempSrc: Oral     SpO2: 94%     Weight:      Height:        Intake/Output Summary (Last 24 hours) at 03/20/2023 1404 Last data filed at 03/20/2023 1054 Gross per 24 hour  Intake 1602.78 ml  Output 0 ml  Net 1602.78 ml   Filed Weights   03/19/23 0356 03/19/23 1327  Weight: 92.5 kg 92.5 kg    Examination:  General exam: Appears calm and comfortable  Respiratory system: Clear to auscultation. Respiratory effort normal. Cardiovascular system: S1 & S2 heard, RRR. No JVD, murmurs, rubs, gallops or clicks. No pedal edema. Gastrointestinal system: Abdomen is soft, benign Central nervous system: Alert and oriented. No focal neurological deficits. Extremities: Symmetric 5 x 5 power. Skin: No rashes, lesions or ulcers Psychiatry: Judgement and insight appear normal. Mood & affect appropriate.     Data Reviewed: I have personally reviewed following labs and imaging studies  CBC: Recent Labs  Lab 03/19/23 0358 03/19/23 1839 03/20/23 0258 03/20/23  1000  WBC 8.0  --   --   --   NEUTROABS 4.1  --   --   --   HGB 13.7 11.0* 9.3* 9.0*  HCT 41.5 32.9* 26.6* 26.3*  MCV 97.2  --   --   --   PLT 292  --   --   --    Basic Metabolic Panel: Recent Labs  Lab 03/19/23 0358 03/20/23 0258  NA 137 138  K 3.8 3.5  CL 108 110  CO2 16* 21*  GLUCOSE 101* 95  BUN 19 14  CREATININE 1.04 0.85  CALCIUM 8.3* 7.8*   GFR: Estimated Creatinine Clearance: 76.6 mL/min (by C-G formula based on SCr of 0.85 mg/dL). Liver Function Tests: Recent Labs  Lab 03/19/23 0927  AST 22  ALT 14  ALKPHOS 43  BILITOT 0.7  PROT 6.4*  ALBUMIN 3.3*   No results for  input(s): "LIPASE", "AMYLASE" in the last 168 hours. No results for input(s): "AMMONIA" in the last 168 hours. Coagulation Profile: Recent Labs  Lab 03/19/23 0744  INR 1.2     Radiology Studies: CT ANGIO GI BLEED Result Date: 03/19/2023 CLINICAL DATA:  BRBPR, h/o Crohn's disease and diverticulosis. EXAM: CTA ABDOMEN AND PELVIS WITHOUT AND WITH CONTRAST TECHNIQUE: Multidetector CT imaging of the abdomen and pelvis was performed using the standard protocol during bolus administration of intravenous contrast. Multiplanar reconstructed images and MIPs were obtained and reviewed to evaluate the vascular anatomy. RADIATION DOSE REDUCTION: This exam was performed according to the departmental dose-optimization program which includes automated exposure control, adjustment of the mA and/or kV according to patient size and/or use of iterative reconstruction technique. CONTRAST:  OMNIPAQUE IOHEXOL 350 MG/ML SOLN COMPARISON:  CT scan renal stone protocol from 06/17/2020 and MRI abdomen from 12/12/2020. 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 Recommend surveillance ultrasound in 3 years. Reference: Journal of Vascular Surgery 67.1 (2018): 2-77. J Am Coll Radiol [phone]. No aortic dissection, vasculitis, penetrating atherosclerotic ulcer or significant stenosis. Celiac: Patent without evidence of aneurysm, dissection, vasculitis or significant stenosis. SMA: Patent without evidence of aneurysm, dissection, vasculitis or significant stenosis. Renals: Both renal arteries are patent without evidence of aneurysm, dissection, vasculitis, fibromuscular dysplasia or significant stenosis. Accessory right renal artery noted, which is also patent. IMA: Patent without evidence of aneurysm, dissection, vasculitis or significant stenosis. Inflow: Patent without evidence of aneurysm,  dissection, vasculitis or significant stenosis. Proximal Outflow: Bilateral common femoral and visualized portions of the superficial and profunda femoral arteries are patent without evidence of aneurysm, dissection, vasculitis or significant stenosis. Veins: No obvious venous abnormality within the limitations of this arterial phase study. Review of the MIP images confirms the above findings. NON-VASCULAR Lower chest: There are patchy atelectatic changes in the visualized lung bases. No overt consolidation. No pleural effusion. There are also moderate paraseptal emphysematous changes in the visualized bilateral lungs. The heart is normal in size. No pericardial effusion. Hepatobiliary: The liver is normal in size. Non-cirrhotic configuration. No suspicious mass. No intrahepatic or extrahepatic bile duct dilation. Small volume dependent gallstones noted without imaging signs of acute cholecystitis. Normal gallbladder wall thickness. No pericholecystic inflammatory changes. Pancreas: Unremarkable. No pancreatic ductal dilatation or surrounding inflammatory changes. Spleen: Within normal limits. No focal lesion. Adrenals/Urinary Tract: Adrenal glands are unremarkable. No suspicious renal mass. There are scattered simple cysts in bilateral kidneys with largest nearly completely exophytic cyst  arising from the right kidney lower pole measuring up to 2.3 x 2.4 cm. No hydronephrosis. No renal or ureteric calculi. Unremarkable urinary bladder. Stomach/Bowel: Note is made of moderate-to-large hiatal hernia. No disproportionate dilation of the small or large bowel loops. The appendix was not visualized; however there is no acute inflammatory process in the right lower quadrant. There are innumerable diverticula throughout the colon. There is subtle pericolonic fat stranding and prominence of vasa recta throughout the colon without definite colonic wall thickening, concerning for subacute/early pancolitis, most likely  infective/inflammatory in etiology. No active extravasation of intravenous contrast noted to suggest active bleeding. Vascular/Lymphatic: No ascites or pneumoperitoneum. No abdominal or pelvic lymphadenopathy, by size criteria. There are moderate peripheral atherosclerotic vascular calcifications of the aorta and its major branches. Reproductive: Normal size prostate. Symmetric seminal vesicles. Other: There are fat containing umbilical and bilateral inguinal hernias. There are postsurgical changes in the bilateral inguinal region, from prior hernia repair. Musculoskeletal: No suspicious osseous lesions. There are mild multilevel degenerative changes in the visualized spine. IMPRESSION: 1. 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 There is subtle pericolonic fat stranding and prominence of vasa recta throughout the colon without definite wall thickening, concerning for subacute/early pancolitis, most likely infective/inflammatory in etiology. No active extravasation of intravenous contrast to suggest active bleeding. 2. 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 Recommend surveillance ultrasound in 3 years. Reference: Journal of Vascular Surgery 67.1 (2018): 2-77. J Am Coll Radiol 2013;10:789-794. 3. Multiple other nonacute observations, as described above. Aortic Atherosclerosis (ICD10-I70.0) and Emphysema (ICD10-J43.9). 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 Electronically Signed   By: Jules Schick M.D.   On: 03/19/2023 10:28        Scheduled Meds:  pantoprazole (PROTONIX) IV  40 mg Intravenous Q12H   Continuous Infusions:  lactated ringers 75 mL/hr at 03/20/23 1025     LOS: 0 days    Time spent: 35 minutes    Lilyana Lippman Sherryll Burger, MD [facility] Pager [phone]  If 7PM-7AM, please contact night-coverage [email or web address] Password [facility] 03/20/2023, 2:04 PM

## 2023-03-20 NOTE — Progress Notes (Signed)
                                                     Palliative Care Progress Note, Assessment & Plan   Patient Name: Kyle Holloway.       Date: 03/20/2023 DOB: [birth year]  Age: 83 y.o. MRN#: [id number] Attending Physician: Delfino Lovett, MD Primary Care Physician: Kandyce Rud, MD Admit Date: 03/19/2023  Extensive chart review completed patient including labs, vital signs, imaging, progress notes, orders, and available advanced directive documents from current and previous encounters.   After reviewing the patient's chart, I attempted to assess patient at bedside.  He was off the floor for procedure.  Patient's wife and stepdaughter were in patient's room.  Space and opportunity provided for family to ask their questions and share their concerns reguarding patient's current medical situation.  Wife shared stories of how she met the patient and how she wants him to get better.    Education provided on Crohn's disease and diverticulosis.    Patient has created a living will.  Requested that family bring copy of living well to hospital for review.  Wife shares to review it with patient before giving it as an official document.  Therapeutic silence and active listening provided for wife and daughter to share their thoughts and emotions.  Emotional support provided.  UTA patient. Will return tomorrow when he not off of the unit for procedures to continue GOC discussions.   Samara Deist L. Bonita Quin, DNP, FNP-BC Palliative Medicine Team  No charge

## 2023-03-20 NOTE — TOC CM/SW Note (Signed)
Transition of Care [facility]) - Inpatient Brief Assessment   Patient Details  Name: Kyle BASTIN Sr. MRN: [id number] Date of Birth: 03-16-1940  Transition of Care [facility]) CM/SW Contact:    Chapman Fitch, RN Phone Number: 03/20/2023, 10:22 AM   Clinical Narrative:    Transition of Care [facility]) Screening Note   Patient Details  Name: Kyle KYSER Sr. Date of Birth: Jun 30, 1940   Transition of Care [facility]) CM/SW Contact:    Chapman Fitch, RN Phone Number: 03/20/2023, 10:22 AM    Transition of Care Department [facility]) has reviewed patient and no TOC needs have been identified at this time. If new patient transition needs arise, please place a TOC consult.   Transition of Care Asessment: Insurance and Status: Insurance coverage has been reviewed Patient has primary care physician: Yes     Prior/Current Home Services: No current home services Social Drivers of Health Review: SDOH reviewed no interventions necessary Readmission risk has been reviewed: Yes Transition of care needs: no transition of care needs at this time

## 2023-03-20 NOTE — Transfer of Care (Signed)
Immediate Anesthesia Transfer of Care Note  Patient: Wyvonnia Lora Sr.  Procedure(s) Performed: COLONOSCOPY WITH PROPOFOL  Patient Location: Endoscopy Unit  Anesthesia Type:General  Level of Consciousness: drowsy  Airway & Oxygen Therapy: Patient Spontanous Breathing  Post-op Assessment: Report given to RN and Post -op Vital signs reviewed and stable  Post vital signs: Reviewed  Last Vitals:  Vitals Value Taken Time  BP 100/52 03/20/23 1101  Temp    Pulse 103 03/20/23 1102  Resp 17 03/20/23 1102  SpO2 96 % 03/20/23 1102  Vitals shown include unfiled device data.  Last Pain:  Vitals:   03/20/23 1101  TempSrc:   PainSc: Asleep         Complications: No notable events documented.

## 2023-03-20 NOTE — Anesthesia Postprocedure Evaluation (Signed)
Anesthesia Post Note  Patient: Kyle KOPPEN Sr.  Procedure(s) Performed: COLONOSCOPY WITH PROPOFOL  Patient location during evaluation: Endoscopy Anesthesia Type: General Level of consciousness: awake and alert Pain management: pain level controlled Vital Signs Assessment: post-procedure vital signs reviewed and stable Respiratory status: spontaneous breathing, nonlabored ventilation, respiratory function stable and patient connected to nasal cannula oxygen Cardiovascular status: blood pressure returned to baseline and stable Postop Assessment: no apparent nausea or vomiting Anesthetic complications: no   No notable events documented.   Last Vitals:  Vitals:   03/20/23 1111 03/20/23 1202  BP: (!) 94/55 121/76  Pulse: 85 69  Resp: 15 16  Temp:  (!) 36.4 C  SpO2: 95% 94%    Last Pain:  Vitals:   03/20/23 1202  TempSrc: Oral  PainSc:                  Corinda Gubler

## 2023-03-20 NOTE — Anesthesia Preprocedure Evaluation (Signed)
Anesthesia Evaluation  Patient identified by MRN, date of birth, ID band Patient awake    Reviewed: Allergy & Precautions, NPO status , Patient's Chart, lab work & pertinent test results  History of Anesthesia Complications Negative for: history of anesthetic complications  Airway Mallampati: III  TM Distance: >3 FB Neck ROM: full    Dental  (+) Missing   Pulmonary shortness of breath and with exertion, COPD, former smoker   Pulmonary exam normal        Cardiovascular Exercise Tolerance: Good hypertension, Pt. on medications (-) angina Normal cardiovascular exam     Neuro/Psych  Neuromuscular disease  negative psych ROS   GI/Hepatic Neg liver ROS,GERD  Controlled,,  Endo/Other  negative endocrine ROS    Renal/GU negative Renal ROS  negative genitourinary   Musculoskeletal   Abdominal   Peds  Hematology negative hematology ROS (+)   Anesthesia Other Findings Past Medical History: No date: Arthritis No date: COPD (chronic obstructive pulmonary disease) (HCC) No date: Crohn's disease (HCC) No date: Emphysema, unspecified (HCC) No date: Hypertension No date: Myasthenia gravis (HCC)  Past Surgical History: No date: JOINT REPLACEMENT; Left     Comment:  total knee replacement    Dr. Ernest Pine No date: TONSILLECTOMY 11/20/2021: TOTAL KNEE ARTHROPLASTY; Right     Comment:  Procedure: RIGHT TOTAL KNEE ARTHROPLASTY;  Surgeon:               Gean Birchwood, MD;  Location: [facility]S;  Service:               Orthopedics;  Laterality: Right; No date: WISDOM TOOTH EXTRACTION  BMI    Body Mass Index: 29.26 kg/m      Reproductive/Obstetrics negative OB ROS                              Anesthesia Physical Anesthesia Plan  ASA: 3  Anesthesia Plan: General   Post-op Pain Management:    Induction: Intravenous  PONV Risk Score and Plan: Propofol infusion and TIVA  Airway Management Planned:  Natural Airway and Nasal Cannula  Additional Equipment:   Intra-op Plan:   Post-operative Plan:   Informed Consent: I have reviewed the patients History and Physical, chart, labs and discussed the procedure including the risks, benefits and alternatives for the proposed anesthesia with the patient or authorized representative who has indicated his/her understanding and acceptance.     Dental Advisory Given  Plan Discussed with: Anesthesiologist, CRNA and Surgeon  Anesthesia Plan Comments: (Patient consented for risks of anesthesia including but not limited to:  - adverse reactions to medications - risk of airway placement if required - damage to eyes, teeth, lips or other oral mucosa - nerve damage due to positioning  - sore throat or hoarseness - Damage to heart, brain, nerves, lungs, other parts of body or loss of life  Patient voiced understanding and assent.)        Anesthesia Quick Evaluation

## 2023-03-21 ENCOUNTER — Encounter
Admission: EM | Disposition: A | Discharge status: Home / Self Care | Payer: Self-pay | Source: Home / Self Care | Attending: Internal Medicine

## 2023-03-21 ENCOUNTER — Encounter: Payer: Self-pay | Admitting: Gastroenterology

## 2023-03-21 DIAGNOSIS — K922 Gastrointestinal hemorrhage, unspecified: Secondary | ICD-10-CM | POA: Diagnosis not present

## 2023-03-21 DIAGNOSIS — D500 Iron deficiency anemia secondary to blood loss (chronic): Secondary | ICD-10-CM

## 2023-03-21 DIAGNOSIS — D62 Acute posthemorrhagic anemia: Secondary | ICD-10-CM

## 2023-03-21 DIAGNOSIS — Z515 Encounter for palliative care: Secondary | ICD-10-CM | POA: Diagnosis not present

## 2023-03-21 DIAGNOSIS — K625 Hemorrhage of anus and rectum: Secondary | ICD-10-CM | POA: Diagnosis not present

## 2023-03-21 DIAGNOSIS — K5731 Diverticulosis of large intestine without perforation or abscess with bleeding: Secondary | ICD-10-CM | POA: Diagnosis not present

## 2023-03-21 HISTORY — PX: EMBOLIZATION (CATH LAB): CATH118239

## 2023-03-21 LAB — CBC
HCT: 21.6 % — ABNORMAL LOW (ref 39.0–52.0)
Hemoglobin: 7.4 g/dL — ABNORMAL LOW (ref 13.0–17.0)
MCH: 32.6 pg (ref 26.0–34.0)
MCHC: 34.3 g/dL (ref 30.0–36.0)
MCV: 95.2 fL (ref 80.0–100.0)
Platelets: 194 10*3/uL (ref 150–400)
RBC: 2.27 MIL/uL — ABNORMAL LOW (ref 4.22–5.81)
RDW: 15.6 % — ABNORMAL HIGH (ref 11.5–15.5)
WBC: 6.8 10*3/uL (ref 4.0–10.5)
nRBC: 0 % (ref 0.0–0.2)

## 2023-03-21 LAB — BASIC METABOLIC PANEL
Anion gap: 6 (ref 5–15)
BUN: 10 mg/dL (ref 8–23)
CO2: 22 mmol/L (ref 22–32)
Calcium: 7.7 mg/dL — ABNORMAL LOW (ref 8.9–10.3)
Chloride: 110 mmol/L (ref 98–111)
Creatinine, Ser: 1.02 mg/dL (ref 0.61–1.24)
GFR, Estimated: >60 mL/min (ref >60)
Glucose, Bld: 91 mg/dL (ref 70–99)
Potassium: 3.8 mmol/L (ref 3.5–5.1)
Sodium: 138 mmol/L (ref 135–145)

## 2023-03-21 LAB — HEMOGLOBIN AND HEMATOCRIT, BLOOD
HCT: 21.2 % — ABNORMAL LOW (ref 39.0–52.0)
HCT: 25.3 % — ABNORMAL LOW (ref 39.0–52.0)
HCT: 25.9 % — ABNORMAL LOW (ref 39.0–52.0)
Hemoglobin: 7.3 g/dL — ABNORMAL LOW (ref 13.0–17.0)
Hemoglobin: 8.6 g/dL — ABNORMAL LOW (ref 13.0–17.0)
Hemoglobin: 8.8 g/dL — ABNORMAL LOW (ref 13.0–17.0)

## 2023-03-21 LAB — PREPARE RBC (CROSSMATCH)

## 2023-03-21 LAB — ABO/RH: ABO/RH(D): A POS

## 2023-03-21 SURGERY — EMBOLIZATION
Anesthesia: Moderate Sedation

## 2023-03-21 SURGERY — VISCERAL ANGIOGRAPHY
Anesthesia: Moderate Sedation

## 2023-03-21 MED ORDER — SODIUM CHLORIDE 0.9% IV SOLUTION: Freq: Once | INTRAVENOUS | Status: AC

## 2023-03-21 MED ORDER — SODIUM CHLORIDE 0.9% FLUSH
3.0000 mL | Freq: Two times a day (BID) | INTRAVENOUS | Status: DC
  Administered 2023-03-21 – 2023-03-22 (×3): 3 mL via INTRAVENOUS

## 2023-03-21 MED ORDER — FENTANYL CITRATE (PF) 100 MCG/2ML IJ SOLN: 12.5000 ug | Freq: Once | INTRAMUSCULAR | Status: DC | PRN

## 2023-03-21 MED ORDER — HEPARIN SODIUM (PORCINE) 1000 UNIT/ML IJ SOLN
INTRAMUSCULAR | Status: AC
  Filled 2023-03-21: qty 10

## 2023-03-21 MED ORDER — FENTANYL CITRATE PF 50 MCG/ML IJ SOSY
PREFILLED_SYRINGE | INTRAMUSCULAR | Status: AC
  Filled 2023-03-21: qty 1

## 2023-03-21 MED ORDER — SODIUM CHLORIDE 0.9 % IV SOLN: 250.0000 mL | INTRAVENOUS | Status: AC | PRN

## 2023-03-21 MED ORDER — FENTANYL CITRATE (PF) 100 MCG/2ML IJ SOLN
INTRAMUSCULAR | Status: DC | PRN
  Administered 2023-03-21: 25 ug via INTRAVENOUS
  Administered 2023-03-21: 50 ug via INTRAVENOUS

## 2023-03-21 MED ORDER — LIDOCAINE HCL (PF) 1 % IJ SOLN
INTRAMUSCULAR | Status: DC | PRN
  Administered 2023-03-21: 10 mL via INTRADERMAL

## 2023-03-21 MED ORDER — DIPHENHYDRAMINE HCL 50 MG/ML IJ SOLN: 50.0000 mg | Freq: Once | INTRAMUSCULAR | Status: DC | PRN

## 2023-03-21 MED ORDER — MIDAZOLAM HCL 2 MG/2ML IJ SOLN
INTRAMUSCULAR | Status: AC
  Filled 2023-03-21: qty 2

## 2023-03-21 MED ORDER — CEFAZOLIN SODIUM-DEXTROSE 1-4 GM/50ML-% IV SOLN
INTRAVENOUS | Status: AC
  Filled 2023-03-21: qty 50

## 2023-03-21 MED ORDER — MIDAZOLAM HCL 2 MG/2ML IJ SOLN
INTRAMUSCULAR | Status: DC | PRN
  Administered 2023-03-21: 2 mg via INTRAVENOUS
  Administered 2023-03-21: 1 mg via INTRAVENOUS

## 2023-03-21 MED ORDER — CEFAZOLIN SODIUM-DEXTROSE 2-4 GM/100ML-% IV SOLN
2.0000 g | INTRAVENOUS | Status: DC
Start: 2023-03-21 — End: 2023-03-21

## 2023-03-21 MED ORDER — SODIUM CHLORIDE 0.9% FLUSH: 3.0000 mL | INTRAVENOUS | Status: DC | PRN

## 2023-03-21 MED ORDER — IODIXANOL 320 MG/ML IV SOLN
INTRAVENOUS | Status: DC | PRN
  Administered 2023-03-21: 50 mL via INTRA_ARTERIAL

## 2023-03-21 MED ORDER — SODIUM CHLORIDE 0.9 % IV SOLN: INTRAVENOUS | Status: AC

## 2023-03-21 MED ORDER — FAMOTIDINE 20 MG PO TABS: 40.0000 mg | ORAL_TABLET | Freq: Once | ORAL | Status: DC | PRN

## 2023-03-21 MED ORDER — CEFAZOLIN SODIUM-DEXTROSE 1-4 GM/50ML-% IV SOLN
1.0000 g | INTRAVENOUS | Status: AC
  Administered 2023-03-21: 1 g via INTRAVENOUS

## 2023-03-21 MED ORDER — MIDAZOLAM HCL 2 MG/ML PO SYRP: 8.0000 mg | ORAL_SOLUTION | Freq: Once | ORAL | Status: DC | PRN

## 2023-03-21 MED ORDER — HEPARIN (PORCINE) IN NACL 1000-0.9 UT/500ML-% IV SOLN
INTRAVENOUS | Status: DC | PRN
  Administered 2023-03-21: 1000 mL

## 2023-03-21 MED ORDER — SODIUM CHLORIDE 0.9 % IV SOLN: INTRAVENOUS | Status: DC

## 2023-03-21 SURGICAL SUPPLY — 18 items
CATH ANGIO 5F PIGTAIL 65CM (CATHETERS) IMPLANT
CATH MICROCATH PRGRT 2.8F 110 (CATHETERS) IMPLANT
CATH VS1 5X80 (CATHETERS) IMPLANT
COVER PROBE ULTRASOUND 5X96 (MISCELLANEOUS) IMPLANT
DEVICE STARCLOSE SE CLOSURE (Vascular Products) IMPLANT
DEVICE TORQUE (MISCELLANEOUS) IMPLANT
GLIDEWIRE ANGLED SS 035X260CM (WIRE) IMPLANT
MICROCATH PROGREAT 2.8F 110 CM (CATHETERS) ×1 IMPLANT
MICROSPHERE 600+75UM (Embolic) IMPLANT
NDL ENTRY 21GA 7CM ECHOTIP (NEEDLE) IMPLANT
NEEDLE ENTRY 21GA 7CM ECHOTIP (NEEDLE) ×1 IMPLANT
PACK ANGIOGRAPHY (CUSTOM PROCEDURE TRAY) ×1 IMPLANT
SET INTRO CAPELLA COAXIAL (SET/KITS/TRAYS/PACK) IMPLANT
SHEATH BRITE TIP 5FRX11 (SHEATH) IMPLANT
STOPCOCK 3WAY MALE LL (IV SETS) IMPLANT
SYR MEDRAD MARK 7 150ML (SYRINGE) IMPLANT
TUBING CONTRAST HIGH PRESS 72 (TUBING) IMPLANT
WIRE GUIDERIGHT .035X150 (WIRE) IMPLANT

## 2023-03-21 NOTE — Consult Note (Signed)
Consultation Note Date: 03/21/2023 at 1300  Patient Name: Kyle RESENDE Sr.  DOB: 1940/07/08  MRN: [id number]  Age / Sex: 83 y.o., male  PCP: Kandyce Rud, MD Referring Physician: Delfino Lovett, MD  HPI/Patient Profile: 83 y.o. male  with past medical history of Crohn's disease, COPD, myasthenia gravis  admitted on 03/19/2023 with GI bleed.  GI was consulted.  Course of hospitalization has included EGD which revealed large hiatal hernia and single Cameron ulcer status post APC and gastritis.  C-scope was performed and revealed multiple diverticuli and that colon was filled with blood.  Capsule study was placed.  Additionally, bleeding scan was performed which revealed bleeding is in the colon and not the small bowel.  2/13, patient taken by vascular surgery for GI embolization with the beads.  PMT was consulted to support patient and family with goals of care discussions.   Clinical Assessment and Goals of Care: Extensive chart review completed prior to meeting patient including labs, vital signs, imaging, progress notes, orders, and available advanced directive documents from current and previous encounters. I then met with patient at bedside to discuss diagnosis prognosis, GOC, EOL wishes, disposition and options.  I introduced Palliative Medicine as specialized medical care for people living with serious illness. It focuses on providing relief from the symptoms and stress of a serious illness. The goal is to improve quality of life for both the patient and the family.  We discussed a brief life review of the patient.  I shared I had an extensive discussion with his wife Kyle Holloway yesterday.  She shared their love story and several details of patient's background.  He shares that he enjoys spending time with his wife, playing the Special educational needs teacher and going to his church.  As far as functional and nutritional status  he shares he was "doing just fine at home".  He is proud that he and his wife remain independent (he at age 14 and her at age 52).  He is hopeful that he can return home to independence again.  We discussed patient's current illness-acute GI bleed-and what it means in the larger context of patient's on-going co-morbidities-Crohn's disease and concern for future GI bleeds.  Patient shares he is hopeful that today's procedure (G embolization with beads) has helped stop the current bleeding.  He knows that this could happen again.  He is hopeful that the unit of blood will make him feel better and that he can hopefully go home tomorrow.  Counseled with patient in regards to overall prognosis.  Discussed functional, nutritional, and cognitive status as significant indicators of his overall prognosis.  He shares that his wife is very good to help him with a diet that is amenable to his diverticulosis.    I attempted to elicit values and goals of care important to the patient.  He speaks of his faith in God and knowing that his spiritual life is in God's hands.  He shares that when the [facility] is ready to take him then he is prepared  to die.  I shared that this is not in line with his current medical CODE STATUS.  Discussed difference between full code, DNR with limited interventions, and DNR with full interventions.  Patient endorses that he would never want to live long-term on machines.  He shares that he would be accepting of CPR in an attempt to "save my life".  However, he would never want to live long-term on machines.    Encouraged patient/family to consider DNR/DNI status understanding evidenced based poor outcomes in similar hospitalized patients, as the cause of the arrest is likely associated with chronic/terminal disease rather than a reversible acute cardio-pulmonary event.    He shares he will discuss this with his wife.  I conveyed that he does not have to make up his mind now but that he can notify  medical staff if he would like to make a change in his CODE STATUS.  Additionally, he could speak with his PCP in regards to boundaries of care.  No adjustment to plan of care at this time.  Full code and full scope remain.  Questions and concerns were addressed. Patient was encouraged to call with questions or concerns.  Was appreciative of my visit.  Just before I left, his pastor came bedside to visit with him.  Goals are clear.  No acute palliative needs anticipated.  PMT will step back from daily visits.  Please reengage when appropriate.  Primary Decision Maker PATIENT  Physical Exam Vitals reviewed.  Constitutional:      General: He is not in acute distress.    Appearance: He is normal weight.  HENT:     Head: Normocephalic.     Mouth/Throat:     Mouth: Mucous membranes are moist.  Eyes:     Pupils: Pupils are equal, round, and reactive to light.  Cardiovascular:     Rate and Rhythm: Normal rate.  Pulmonary:     Effort: Pulmonary effort is normal.  Abdominal:     Palpations: Abdomen is soft.  Skin:    General: Skin is warm and dry.  Neurological:     Mental Status: He is alert and oriented to person, place, and time.  Psychiatric:        Mood and Affect: Mood normal.        Behavior: Behavior normal.        Thought Content: Thought content normal.        Judgment: Judgment normal.     Palliative Assessment/Data: 80%     Thank you for this consult. Palliative medicine will continue to follow and assist holistically.   Time Total: 75 minutes  Time spent includes: Detailed review of medical records (labs, imaging, vital signs), medically appropriate exam (mental status, respiratory, cardiac, skin), discussed with treatment team, counseling and educating patient, family and staff, documenting clinical information, medication management and coordination of care.  Signed by: Georgiann Cocker, DNP, FNP-BC Palliative Medicine   Please contact Palliative Medicine  Team providers via [facility] for questions and concerns.

## 2023-03-21 NOTE — Progress Notes (Signed)
This RN went into room and was informed by NT that pt had large amount of blood from rectum while going to restroom. VSS. Pt due for H/H soon. MDs notified

## 2023-03-21 NOTE — Plan of Care (Signed)
Problem: Education: Goal: Knowledge of General Education information will improve Description Including pain rating scale, medication(s)/side effects and non-pharmacologic comfort measures Outcome: Progressing   Problem: Health Behavior/Discharge Planning: Goal: Ability to manage health-related needs will improve Outcome: Progressing

## 2023-03-21 NOTE — Plan of Care (Signed)
Problem: Clinical Measurements: Goal: Will remain free from infection Outcome: Progressing Goal: Diagnostic test results will improve Outcome: Progressing

## 2023-03-21 NOTE — Progress Notes (Signed)
The patient had a colonoscopy with diverticulosis seen and a capsule was placed to make sure the blood was not coming from the small bowel. Subsequent bleeding scan confirmed the bleeding to be in the colon and not the small bowel and showed:  IMPRESSION: 1. Examination is positive for active colonic bleeding. The site of bleeding originates at the level of the hepatic flexure/distal ascending colon.  These diverticular bleeds are not amenable to endoscopic therapy and is being handled by vascular surgery.  I will sign off.  Please call if any further GI concerns or questions.  We would like to thank you for the opportunity to participate in the care of Kyle Holloway.Marland Kitchen

## 2023-03-21 NOTE — Op Note (Signed)
[facility]  Percutaneous Study/Intervention Procedural Note     Surgeon(s): Levora Dredge, MD   Assistants: none  Pre-operative Diagnosis:  1. Lower GI bleed 2.  Anemia of blood loss 3.  Diverticulosis of the colon   Post-operative diagnosis:  Same  Procedure(s) Performed:             1.  Ultrasound guidance for vascular access right femoral artery             2.  Catheter placement into the middle colic artery and the left colic artery as well as the proximal sigmoidal artery             3.  Aortogram and selective angiogram of the middle colic artery and the left colic artery as well as the proximal sigmoidal artery             4.  Microbead embolization of 1.5 cc with medium 500-700  polyvinyl alcohol beads.             5.  StarClose closure device right femoral artery  Anesthesia: Continuous ECG pulse oximetry and cardiopulmonary monitoring was performed throughout the entire procedure by the interventional radiology nurse.  Parenteral Versed and fentanyl were utilized.  Total sedation time was 57 minutes           EBL: Less than 5 cc  Fluoro Time: 9.24 minutes  Contrast: 50 cc              Indications:  Patient is a 82 y.o.male with brisk lower GI bleeding with resultant anemia. The patient has a nuclear medicine study showing bleeding from the region of the distal right colon and hepatic flexure. The patient is brought in for angiography for further evaluation and potential treatment. Risks and benefits are discussed and informed consent is obtained  Procedure:  The patient was identified and appropriate procedural time out was performed.  The patient was then placed supine on the table and prepped and draped in the usual sterile fashion. Moderate conscious sedation was administered during a face to face encounter with the patient throughout the procedure with my supervision of the RN administering medicines and monitoring the patient's vital  signs, pulse oximetry, telemetry and mental status throughout from the start of the procedure until the patient was taken to the recovery room.  Ultrasound was used to evaluate the right common femoral artery.  It was patent .  A digital ultrasound image was acquired.  A Seldinger needle was used to access the right common femoral artery under direct ultrasound guidance and a permanent image was performed.  A 0.035 J wire was advanced without resistance and a 5Fr sheath was placed.  Pigtail catheter was placed into the aorta and an AP aortogram was performed. This demonstrated filling of the SMA and IMA as well as the celiac artery.  We transitioned to the lateral projection and performed a lateral image and proceeded to cannulate the SMA. A V S1 catheter was used to selectively cannulate the SMA.  A prograde catheter was then advanced through the V S1 catheter and selective imaging of the SMA was performed.  I then selected the middle colic artery with the prograde catheter and advanced the prograde out to the region of the hepatic flexure.  Selective imaging was performed.  Based on his continued bleeding and the nuclear medicine study I elected to treat this area with embolization. I utilized the Pro-Great microcatheter out the middle colic and instilled  approximately 1.5 cc of medium sized 500 to 700 m PVC beads in this location. Angiogram following this showed the main vessels to be open with less brisk filling. I then pulled the V S1 back into the aorta and exchanged it for a pigtail catheter positioned in the mid infrarenal aorta and performed an RAO projection.  This demonstrated the origin of the IMA.  I then reintroduced the V S1 catheter and selected the IMA.  Utilizing the prograde catheter through the IMA I then selected the left colic artery where hand-injection of contrast was utilized to demonstrate the distal vasculature.  No blush consistent with ongoing bleeding was identified.  The prograde was  pulled back into the IMA and then advanced more distally and I selected the proximal sigmoid artery and again hand-injection of contrast was performed.  No blush consistent with ongoing bleeding was noted and I elected to terminate the procedure.  The V S1 and prograde catheters were removed and the J-wire readvanced through the 5 French sheath.    The diagnostic catheter was removed. StarClose closure device was deployed in usual fashion with excellent hemostatic result. The patient was taken to the recovery room in stable condition having tolerated the procedure well.     Findings: Initial images of the SMA demonstrated wide patency of the artery.  The region of the hepatic flexure was identified and I subsequently negotiated the prograde out to this area.  Selective injection demonstrated the possibility of a faint blush and based on the patient's ongoing bleeding in association with the positive nuclear medicine scan as noted above I elected to treat.  Beads were  injected as described above.  Follow-up imaging demonstrated its significant less distal filling and I then interrogated the IMA which did not demonstrate any active bleeding in the left colic or sigmoid distribution.  Disposition: Patient was taken to the recovery room in stable condition having tolerated the procedure well.  Complications:  None  Levora Dredge 03/21/2023 8:42 AM   This note was created with Dragon Medical transcription system. Any errors in dictation are purely unintentional.

## 2023-03-21 NOTE — Progress Notes (Signed)
Progress Note    03/21/2023 8:12 AM * Day of Surgery *  Subjective:  Kyle Holloway is an 83 yo male who presented to [facility] ER with GI bleeding. Patient has a history of Crohns disease and Myasthenia Gravis. Recheck on his Hgb this morning shows a decrease to 7.1. Vascular surgery recommends GI embolization at this time.     Vitals:   03/21/23 0748 03/21/23 0756  BP: 124/75   Pulse: (!) 108   Resp: 18   Temp: 97.9 F (36.6 C)   SpO2: 90% 93%   Physical Exam: Cardiac:  RRR. Normal S1, S2. No murmurs Lungs:  Clear throughout on auscultation. Non labored breathing.  Incisions:  None Extremities:  Palpable pulses throughout Abdomen:  Positive high pitched bowel sounds in left lower quadrant. Soft, non tender and non distended.  Neurologic: AAOX3, answers all questions and follows commands appropriately.   CBC    Component Value Date/Time   WBC 6.8 03/21/2023 0458   RBC 2.27 (L) 03/21/2023 0458   HGB 7.4 (L) 03/21/2023 0458   HGB 12.4 (L) 06/18/2013 0438   HCT 21.6 (L) 03/21/2023 0458   HCT 37.5 (L) 06/17/2013 0548   PLT 194 03/21/2023 0458   PLT 272 06/17/2013 0548   MCV 95.2 03/21/2023 0458   MCV 98 06/17/2013 0548   MCH 32.6 03/21/2023 0458   MCHC 34.3 03/21/2023 0458   RDW 15.6 (H) 03/21/2023 0458   RDW 14.8 (H) 06/17/2013 0548   LYMPHSABS 2.5 03/19/2023 0358   LYMPHSABS 0.7 (L) 06/17/2013 0548   MONOABS 1.0 03/19/2023 0358   MONOABS 0.2 06/17/2013 0548   EOSABS 0.3 03/19/2023 0358   EOSABS 0.0 06/17/2013 0548   BASOSABS 0.1 03/19/2023 0358   BASOSABS 0.0 06/17/2013 0548    BMET    Component Value Date/Time   NA 138 03/21/2023 0458   NA 137 06/17/2013 0548   K 3.8 03/21/2023 0458   K 4.3 06/17/2013 0548   CL 110 03/21/2023 0458   CL 110 (H) 06/17/2013 0548   CO2 22 03/21/2023 0458   CO2 21 06/17/2013 0548   GLUCOSE 91 03/21/2023 0458   GLUCOSE 147 (H) 06/17/2013 0548   BUN 10 03/21/2023 0458   BUN 17 06/17/2013 0548   CREATININE 1.02 03/21/2023  0458   CREATININE 0.94 06/17/2013 0548   CALCIUM 7.7 (L) 03/21/2023 0458   CALCIUM 8.4 (L) 06/17/2013 0548   GFRNONAA >60 03/21/2023 0458   GFRNONAA >60 06/17/2013 0548   GFRAA >60 01/02/2018 1011   GFRAA >60 06/17/2013 0548    INR    Component Value Date/Time   INR 1.2 03/19/2023 0744   INR 1.1 06/16/2013 1923     Intake/Output Summary (Last 24 hours) at 03/21/2023 1610 Last data filed at 03/20/2023 1054 Gross per 24 hour  Intake 300 ml  Output --  Net 300 ml     Assessment/Plan:  83 y.o. male with history of Crohn's disease and Myasthenia gravis.  * Day of Surgery *   Plan After a drop in HGB this morning the vascular surgery plans on taking the patient to the vascular lab for GI embolization with beads. I had a long detailed discussion with the patient and the family at the bedside this morning concerning the procedure, benefits, risks and complications. All verbalized there understanding and wish to proceed. I answered all there questions this morning. Patient endorses he has been NPO since midnight last night.   I discussed the plan in detail Dr Tammy Sours  Schnier MD and he agrees with the plan.   DVT prophylaxis:  NONE   Laquetta Racey R Rosea Dory [facility] 03/21/2023 8:12 AM

## 2023-03-21 NOTE — Progress Notes (Signed)
1      PROGRESS NOTE    [facility]  Kyle Holloway DOB: [birth year] DOA: 03/19/2023 PCP: Kandyce Rud, MD    Brief Narrative:   83 y.o. male with medical history significant of Crohn's disease, COPD, myasthenia gravis admitted for GI bleed  2/11: GI consult, EGD showed large hiatal hernia with single Cameron ulcer status post APC, gastritis 2/12: C-scope showed multiple diverticuli, colon filled with blood with more fresh blood on the left side - bleeding scan + 2/13: s/p emobolization by Vascular surgery, Gen Surgery c/s     Assessment & Plan:   Principal Problem:   Acute GI bleeding Active Problems:   Rectal bleeding   GI bleeding   Melena Lower/upper GI bleed Continue IV fluids N.p.o. for now. Hold aspirin. Continue IV Protonix EGD showed large hiatal hernia with single Cameron ulcer status post APC, gastritis C-scope showed multiple diverticuli, colon filled with blood with more fresh blood on the left side NM tagged pRBC scan on 2/12 showed the source to be the hepatic flexure. He underwent embolization today with Dr. Gilda Crease, but then once back in his room he had a lot of dark blood per rectum when going to the bathroom. Gen Surgery c/s - NPO for now. No plans for surgery currently. H & H stable   Acute blood loss anemia due to GI bleed 1 PRBC transfusion ordered for today. Will transfuse more if Hb < 7 H&H Q 8 hrs Avoid aspirin or any blood thinners    Latest Ref Rng & Units 03/21/2023    4:01 PM 03/21/2023    4:58 AM 03/21/2023    2:07 AM  CBC  WBC 4.0 - 10.5 K/uL  6.8    Hemoglobin 13.0 - 17.0 g/dL 8.6  7.4  7.3   Hematocrit 39.0 - 52.0 % 25.3  21.6  21.2   Platelets 150 - 400 K/uL  194       Crohn's disease: Resume balsalazide once able to tolerate diet.   Myasthenia gravis: Resume pyridostigmine therapy once able to take p.o.   COPD: No exacerbation.  Bronchodilators as needed.   BPH: Continue finasteride, tamsulosin therapy once diet  resumed.   DVT prophylaxis: SCD's SCDs Start: 03/19/23 1806     Code Status: Full code Family Communication: Updated daughter and wife at bedside Disposition Plan: Possible discharge to home in next 2-3 days depending on clinical condition and if no further bleed   Consultants:  GI Vascular Surgery Gen Surgery  Procedures:  EGD and colonoscopy GI Embolization    Subjective:  Had another large rectal bleed/ dark blood s/p embolization.not symptomatic, family at bedside.  Objective: Vitals:   03/21/23 1133 03/21/23 1203 03/21/23 1413 03/21/23 1500  BP: 135/80 128/76 116/85 125/81  Pulse: 79 72 88 92  Resp: 17 16 18 18   Temp: 97.9 F (36.6 C) 98 F (36.7 C) 98 F (36.7 C) 98.4 F (36.9 C)  TempSrc: Oral Oral Oral Oral  SpO2: 98% 99% 96% 94%  Weight:      Height:        Intake/Output Summary (Last 24 hours) at 03/21/2023 1731 Last data filed at 03/21/2023 1430 Gross per 24 hour  Intake 391 ml  Output 150 ml  Net 241 ml   Filed Weights   03/19/23 0356 03/19/23 1327  Weight: 92.5 kg 92.5 kg    Examination:  General exam: Appears calm and comfortable  Respiratory system: Clear to auscultation. Respiratory effort normal. Cardiovascular system: S1 &  S2 heard, RRR. No JVD, murmurs, No pedal edema. Gastrointestinal system: Abdomen is soft, benign Central nervous system: Alert and oriented. No focal neurological deficits. Extremities: Symmetric 5 x 5 power. Skin: No rashes, lesions or ulcers Psychiatry: Judgement and insight appear normal. Mood & affect appropriate.     Data Reviewed: I have personally reviewed following labs and imaging studies  CBC: Recent Labs  Lab 03/19/23 0358 03/19/23 1839 03/20/23 1000 03/20/23 1839 03/21/23 0207 03/21/23 0458 03/21/23 1601  WBC 8.0  --   --   --   --  6.8  --   NEUTROABS 4.1  --   --   --   --   --   --   HGB 13.7   < > 9.0* 8.1* 7.3* 7.4* 8.6*  HCT 41.5   < > 26.3* 24.3* 21.2* 21.6* 25.3*  MCV 97.2  --    --   --   --  95.2  --   PLT 292  --   --   --   --  194  --    < > = values in this interval not displayed.   Basic Metabolic Panel: Recent Labs  Lab 03/19/23 0358 03/20/23 0258 03/21/23 0458  NA 137 138 138  K 3.8 3.5 3.8  CL 108 110 110  CO2 16* 21* 22  GLUCOSE 101* 95 91  BUN 19 14 10   CREATININE 1.04 0.85 1.02  CALCIUM 8.3* 7.8* 7.7*   GFR: Estimated Creatinine Clearance: 63.8 mL/min (by C-G formula based on SCr of 1.02 mg/dL). Liver Function Tests: Recent Labs  Lab 03/19/23 0927  AST 22  ALT 14  ALKPHOS 43  BILITOT 0.7  PROT 6.4*  ALBUMIN 3.3*   No results for input(s): "LIPASE", "AMYLASE" in the last 168 hours. No results for input(s): "AMMONIA" in the last 168 hours. Coagulation Profile: Recent Labs  Lab 03/19/23 0744  INR 1.2     Radiology Studies: PERIPHERAL VASCULAR CATHETERIZATION Result Date: 03/21/2023 See surgical note for result.  NM GI Blood Loss Result Date: 03/20/2023 CLINICAL DATA:  Patient presents with bright red blood per rectum and dark stools. Anemia. EXAM: NUCLEAR MEDICINE GASTROINTESTINAL BLEEDING SCAN TECHNIQUE: Sequential abdominal images were obtained following intravenous administration of Tc-47m labeled red blood cells. RADIOPHARMACEUTICALS:  23.21 mCi Tc-79m pertechnetate in-vitro labeled red cells. COMPARISON:  03/19/2023 FINDINGS: During the first hour of imaging there is a focal area of increased radiotracer uptake localizing to the hepatic flexure. During the second hour of imaging radiotracer activity increases in intensity and exhibits antegrade progression along the transverse colon up to the sigmoid colon. IMPRESSION: 1. Examination is positive for active colonic bleeding. The site of bleeding originates at the level of the hepatic flexure/distal ascending colon. Electronically Signed   By: Signa Kell M.D.   On: 03/20/2023 15:55        Scheduled Meds:  pantoprazole (PROTONIX) IV  40 mg Intravenous Q12H   sodium  chloride flush  3 mL Intravenous Q12H   Continuous Infusions:  sodium chloride       LOS: 1 day    Time spent: 35 minutes    Delfino Lovett, MD [facility] Pager [phone]  If 7PM-7AM, please contact night-coverage [email or web address] Password [facility] 03/21/2023, 5:31 PM

## 2023-03-21 NOTE — Consult Note (Addendum)
[facility] SURGICAL CONSULTATION NOTE (initial) - cpt: 16109   HISTORY OF PRESENT ILLNESS (HPI):  83 y.o. male presented to [facility] ED on 02/11 secondary to reports of rectal bleeding. At time of presentation, patient was reporting bright red blood and dark stools which onset that morning. No accompanying abdominal pain, nausea, emesis, fever, chills. He does endorse a history of GI bleeding in the past about 7 years ago but is uncertain of the cause. He is not on anticoagulation aside from 81 mg ASA. Initial Hgb was 13.0 but this has since trended down to 7.4 as of this morning. He was admitted to the hospitalist. GI was consulted and he underwent upper endoscopy on 02/11 which showed small Cameron ulcer which was treated. He would also undergo colonoscopy on 02/12 which showed blood throughout the colon without clear source. He underwent tagged red blood cell scan on 02/12 which showed evidence of bleeding from hepatic flexure. Vascular surgery was consulted and he underwent embolization with Dr Gilda Crease this morning.   This afternoon, he was getting up and noticed bright red blood again from his rectum. He denied any abdominal pain, nausea, emesis, light headedness, nor dizziness. He is currently hemodynamically stable. Repeat H&H is pending.   Surgery is consulted by hospitalist physician Dr. Delfino Lovett, MD in this context for evaluation and management of GI bleeding.  PAST MEDICAL HISTORY (PMH):  Past Medical History:  Diagnosis Date   Arthritis    COPD (chronic obstructive pulmonary disease) (HCC)    Crohn's disease (HCC)    Emphysema, unspecified (HCC)    Hypertension    Myasthenia gravis (HCC)      PAST SURGICAL HISTORY (PSH):  Past Surgical History:  Procedure Laterality Date   COLONOSCOPY WITH PROPOFOL N/A 03/20/2023   Procedure: COLONOSCOPY WITH PROPOFOL;  Surgeon: Midge Minium, MD;  Location: [facility] ENDOSCOPY;  Service: Endoscopy;  Laterality: N/A;   JOINT  REPLACEMENT Left    total knee replacement    Dr. Ernest Pine   TONSILLECTOMY     TOTAL KNEE ARTHROPLASTY Right 11/20/2021   Procedure: RIGHT TOTAL KNEE ARTHROPLASTY;  Surgeon: Gean Birchwood, MD;  Location: [facility] ORS;  Service: Orthopedics;  Laterality: Right;   WISDOM TOOTH EXTRACTION       MEDICATIONS:  Prior to Admission medications   Medication Sig Start Date End Date Taking? Authorizing Provider  acetaminophen (TYLENOL) 325 MG tablet Take 650 mg by mouth 3 (three) times daily as needed (pain).   Yes [provider]  albuterol (PROVENTIL HFA;VENTOLIN HFA) 108 (90 Base) MCG/ACT inhaler Inhale 2 puffs into the lungs every 6 (six) hours as needed for wheezing or shortness of breath. 12/29/17  Yes Salary, Evelena Asa, MD  aspirin EC 81 MG tablet Take 81 mg by mouth daily.    Yes [provider]  atenolol (TENORMIN) 25 MG tablet Take 1 tablet by mouth daily. 04/19/22 04/19/23 Yes [provider]  balsalazide (COLAZAL) 750 MG capsule Take 750 mg by mouth 3 (three) times daily. 09/24/17  Yes [provider]  Biotin 1 MG CAPS Take 1 mg by mouth daily.   Yes [provider]  cholecalciferol (VITAMIN D3) 25 MCG (1000 UNIT) tablet Take 2,000 Units by mouth daily.   Yes [provider]  finasteride (PROSCAR) 5 MG tablet Take 5 mg by mouth daily. 12/12/17  Yes [provider]  folic acid (FOLVITE) 1 MG tablet Take 1 mg by mouth daily. 07/06/14  Yes [provider]  methotrexate (RHEUMATREX) 2.5  MG tablet Take 2 tablets (5 mg total) by mouth once a week. Every Wednesday  Caution:Chemotherapy. Protect from light. Patient taking differently: Take 10 mg by mouth once a week. On Wednesday. 12/29/17  Yes Salary, Evelena Asa, MD  Multiple Vitamins-Minerals (MULTIVITAMIN WITH MINERALS) tablet Take 1 tablet by mouth daily.   Yes [provider]  Multiple Vitamins-Minerals (PRESERVISION AREDS PO) Take 2 tablets by mouth daily.   Yes [provider]  Omega-3 Fatty Acids (FISH OIL) 1000 MG CAPS Take by mouth.   Yes [provider]  omeprazole (PRILOSEC) 20 MG capsule Take 40 mg by mouth daily. 09/06/21  Yes [provider]  prednisoLONE acetate (PRED FORTE) 1 % ophthalmic suspension Place 1 drop into the left eye 2 (two) times daily. 11/02/21  Yes [provider]  pyridostigmine (MESTINON) 60 MG tablet Take 0.5 tablets (30 mg total) by mouth every 8 (eight) hours. Patient taking differently: Take 60 mg by mouth 3 (three) times daily. See Rx fill hx and office visit note 01/18/23 12/29/17  Yes Salary, Jetty Duhamel D, MD  tamsulosin (FLOMAX) 0.4 MG CAPS capsule Take 0.4 mg by mouth daily. 12/12/17  Yes [provider]  vitamin B-12 (CYANOCOBALAMIN) 500 MCG tablet Take 1,000 mcg by mouth daily.   Yes [provider]  Bromfenac Sodium 0.09 % SOLN Apply to eye as directed. Patient not taking: Reported on 03/19/2023 09/28/21   [provider]  valACYclovir (VALTREX) 500 MG tablet Take 500 mg by mouth daily. Patient not taking: Reported on 03/20/2023 11/04/21   [provider]     ALLERGIES:  No Known Allergies   SOCIAL HISTORY:  Social History   Socioeconomic History   Marital status: Married    Spouse name: Not on file   Number of children: Not on file   Years of education: Not on file   Highest education level: Not on file  Occupational History   Not on file  Tobacco Use   Smoking status: Former    Current packs/day: 0.00    Average packs/day: 1.5 packs/day for 95.0 years (142.5 ttl pk-yrs)    Types: Cigarettes    Start date: 74    Quit date: 22    Years since quitting: 30.1   Smokeless tobacco: Never  Vaping Use   Vaping status: Never Used  Substance and Sexual Activity   Alcohol use: Not Currently   Drug use: Never   Sexual activity: Not Currently  Other Topics Concern   Not on file  Social History Narrative   Not on file   Social Drivers of Health    Financial Resource Strain: Patient Declined (02/07/2023)   Received from [facility]   Overall Financial Resource Strain (CARDIA)    Difficulty of Paying Living Expenses: Patient declined  Food Insecurity: No Food Insecurity (03/19/2023)   Hunger Vital Sign    Worried About Running Out of Food in the Last Year: Never true    Ran Out of Food in the Last Year: Never true  Transportation Needs: Patient Declined (02/07/2023)   Received from [facility]   PRAPARE - Transportation    In the past 12 months, has lack of transportation kept you from medical appointments or from getting medications?: Patient declined    Lack of Transportation (Non-Medical): Patient declined  Physical Activity: Not on file  Stress: Not on file  Social Connections: Not on file  Intimate Partner Violence: Not on file  FAMILY HISTORY:  Family History  Problem Relation Age of Onset   Brain cancer Father       REVIEW OF SYSTEMS:  Review of Systems  Constitutional:  Negative for chills and fever.  Respiratory:  Negative for cough and shortness of breath.   Cardiovascular:  Negative for chest pain and palpitations.  Gastrointestinal:  Positive for blood in stool. Negative for abdominal pain, nausea and vomiting.  Genitourinary:  Negative for dysuria and urgency.  All other systems reviewed and are negative.   VITAL SIGNS:  Temp:  [97.6 F (36.4 C)-98.4 F (36.9 C)] 98.4 F (36.9 C) (02/13 1500) Pulse Rate:  [0-108] 92 (02/13 1500) Resp:  [15-18] 18 (02/13 1500) BP: (113-157)/(44-93) 125/81 (02/13 1500) SpO2:  [89 %-100 %] 94 % (02/13 1500)     Height: 5\' 10"  (177.8 cm) Weight: 92.5 kg BMI (Calculated): 29.26   INTAKE/OUTPUT:  02/12 0701 - 02/13 0700 In: 300 [I.V.:300] Out: -   PHYSICAL EXAM:  Physical Exam Vitals and nursing note reviewed. Exam conducted with a chaperone present.  Constitutional:      General: He is not in acute distress.    Appearance:  Normal appearance. He is normal weight. He is not ill-appearing.     Comments: Resting in bed; NAD Family at bedside  HENT:     Head: Normocephalic and atraumatic.  Eyes:     General: No scleral icterus.    Conjunctiva/sclera: Conjunctivae normal.  Cardiovascular:     Rate and Rhythm: Normal rate.     Pulses: Normal pulses.  Pulmonary:     Effort: Pulmonary effort is normal. No respiratory distress.  Abdominal:     General: Abdomen is flat. There is no distension.     Palpations: Abdomen is soft.     Tenderness: There is no abdominal tenderness. There is no guarding or rebound.     Comments: Abdomen is soft, non-tender, non-distended, no rebound/guarding   Genitourinary:    Comments: Deferred Skin:    General: Skin is warm and dry.     Coloration: Skin is not pale.     Findings: No erythema.  Neurological:     General: No focal deficit present.     Mental Status: He is alert and oriented to person, place, and time.  Psychiatric:        Mood and Affect: Mood normal.        Behavior: Behavior normal.      Labs:     Latest Ref Rng & Units 03/21/2023    4:58 AM 03/21/2023    2:07 AM 03/20/2023    6:39 PM  CBC  WBC 4.0 - 10.5 K/uL 6.8     Hemoglobin 13.0 - 17.0 g/dL 7.4  7.3  8.1   Hematocrit 39.0 - 52.0 % 21.6  21.2  24.3   Platelets 150 - 400 K/uL 194         Latest Ref Rng & Units 03/21/2023    4:58 AM 03/20/2023    2:58 AM 03/19/2023    9:27 AM  CMP  Glucose 70 - 99 mg/dL 91  95    BUN 8 - 23 mg/dL 10  14    Creatinine 6.29 - 1.24 mg/dL 5.28  4.13    Sodium 244 - 145 mmol/L 138  138    Potassium 3.5 - 5.1 mmol/L 3.8  3.5    Chloride 98 - 111 mmol/L 110  110    CO2 22 - 32 mmol/L 22  21  Calcium 8.9 - 10.3 mg/dL 7.7  7.8    Total Protein 6.5 - 8.1 g/dL   6.4   Total Bilirubin 0.0 - 1.2 mg/dL   0.7   Alkaline Phos 38 - 126 U/L   43   AST 15 - 41 U/L   22   ALT 0 - 44 U/L   14      Imaging studies:   NM Bleeding Scan (03/20/2023) personally reviewed with  + bleeding in hepatic flexure, and radiologist report reviewed  IMPRESSION: 1. Examination is positive for active colonic bleeding. The site of bleeding originates at the level of the hepatic flexure/distal ascending colon.   Assessment/Plan:  82 y.o. male with GI bleeding from hepatic flexure s/p embolization 02/13.   - Suspect most recent episode of blood per rectum is secondary to residual blood prior to his procedures. He is currently hemodynamically stable and abdominal examination is benign. For now, we will continue to follow clinically, trend his H&H, and monitor hemodynamics. Patient, and his family, understand we will only pursue surgical intervention should he continue to bleed or deteriorate clinically. They understand this will involve right hemicolectomy.    - We will keep him NPO overnight as a precaution - Monitor H&H; repeat pending - Monitor abdominal examination; on-going bowel function    - Mobilize as tolerated - Appreciate vascular and GI efforts   - DVT prophylaxis; hold  - Further management per primary service; we will follow   All of the above findings and recommendations were discussed with the patient and his family, and all of their questions were answered to their expressed satisfaction.  Thank you for the opportunity to participate in this patient's care.   -- Lynden Oxford, PA-C [facility] 03/21/2023, 3:31 PM M-F: 7am - 4pm

## 2023-03-22 ENCOUNTER — Encounter: Payer: Self-pay | Admitting: Vascular Surgery

## 2023-03-22 ENCOUNTER — Other Ambulatory Visit: Payer: Self-pay

## 2023-03-22 DIAGNOSIS — D62 Acute posthemorrhagic anemia: Secondary | ICD-10-CM | POA: Diagnosis not present

## 2023-03-22 DIAGNOSIS — K625 Hemorrhage of anus and rectum: Secondary | ICD-10-CM | POA: Diagnosis not present

## 2023-03-22 DIAGNOSIS — K922 Gastrointestinal hemorrhage, unspecified: Secondary | ICD-10-CM | POA: Diagnosis not present

## 2023-03-22 LAB — BPAM RBC
Blood Product Expiration Date: 202503162359
ISSUE DATE / TIME: 202502131140
Unit Type and Rh: 6200

## 2023-03-22 LAB — BASIC METABOLIC PANEL
Anion gap: 6 (ref 5–15)
BUN: 7 mg/dL — ABNORMAL LOW (ref 8–23)
CO2: 22 mmol/L (ref 22–32)
Calcium: 8 mg/dL — ABNORMAL LOW (ref 8.9–10.3)
Chloride: 108 mmol/L (ref 98–111)
Creatinine, Ser: 0.87 mg/dL (ref 0.61–1.24)
GFR, Estimated: >60 mL/min (ref >60)
Glucose, Bld: 92 mg/dL (ref 70–99)
Potassium: 3.3 mmol/L — ABNORMAL LOW (ref 3.5–5.1)
Sodium: 136 mmol/L (ref 135–145)

## 2023-03-22 LAB — CBC
HCT: 24.1 % — ABNORMAL LOW (ref 39.0–52.0)
Hemoglobin: 8.4 g/dL — ABNORMAL LOW (ref 13.0–17.0)
MCH: 31.9 pg (ref 26.0–34.0)
MCHC: 34.9 g/dL (ref 30.0–36.0)
MCV: 91.6 fL (ref 80.0–100.0)
Platelets: 222 10*3/uL (ref 150–400)
RBC: 2.63 MIL/uL — ABNORMAL LOW (ref 4.22–5.81)
RDW: 15.8 % — ABNORMAL HIGH (ref 11.5–15.5)
WBC: 7.6 10*3/uL (ref 4.0–10.5)
nRBC: 0 % (ref 0.0–0.2)

## 2023-03-22 LAB — HEMOGLOBIN AND HEMATOCRIT, BLOOD
HCT: 24.8 % — ABNORMAL LOW (ref 39.0–52.0)
HCT: 25.4 % — ABNORMAL LOW (ref 39.0–52.0)
HCT: 25.3 % — ABNORMAL LOW (ref 39.0–52.0)
Hemoglobin: 8.3 g/dL — ABNORMAL LOW (ref 13.0–17.0)
Hemoglobin: 8.7 g/dL — ABNORMAL LOW (ref 13.0–17.0)
Hemoglobin: 8.6 g/dL — ABNORMAL LOW (ref 13.0–17.0)

## 2023-03-22 LAB — TYPE AND SCREEN
ABO/RH(D): A POS
Antibody Screen: NEGATIVE
Unit division: 0

## 2023-03-22 MED ORDER — OMEPRAZOLE 40 MG PO CPDR
40.0000 mg | DELAYED_RELEASE_CAPSULE | Freq: Two times a day (BID) | ORAL | 0 refills | Status: AC
  Filled 2023-03-22: qty 60, 30d supply, fill #0

## 2023-03-22 MED ORDER — POTASSIUM CHLORIDE CRYS ER 20 MEQ PO TBCR
20.0000 meq | EXTENDED_RELEASE_TABLET | Freq: Once | ORAL | Status: AC
  Administered 2023-03-22: 20 meq via ORAL
  Filled 2023-03-22: qty 1

## 2023-03-22 MED ORDER — PANTOPRAZOLE SODIUM 40 MG PO TBEC
40.0000 mg | DELAYED_RELEASE_TABLET | Freq: Two times a day (BID) | ORAL | Status: DC
  Administered 2023-03-23: 40 mg via ORAL
  Filled 2023-03-22 (×2): qty 1

## 2023-03-22 NOTE — Plan of Care (Signed)
Problem: Education: Goal: Knowledge of General Education information will improve Description Including pain rating scale, medication(s)/side effects and non-pharmacologic comfort measures Outcome: Progressing   Problem: Health Behavior/Discharge Planning: Goal: Ability to manage health-related needs will improve Outcome: Progressing

## 2023-03-22 NOTE — Progress Notes (Signed)
PHARMACY CONSULT NOTE - ELECTROLYTES  Pharmacy Consult for Electrolyte Monitoring and Replacement   Recent Labs: Height: 5\' 10"  (177.8 cm) Weight: 92.5 kg (203 lb 14.8 oz) IBW/kg (Calculated) : 73 Estimated Creatinine Clearance: 74.8 mL/min (by C-G formula based on SCr of 0.87 mg/dL). Potassium (mmol/L)  Date Value  03/22/2023 3.3 (L)  06/17/2013 4.3   Calcium (mg/dL)  Date Value  16/11/9602 8.0 (L)   Calcium, Total (mg/dL)  Date Value  54/10/8117 8.4 (L)   Albumin (g/dL)  Date Value  14/78/2956 3.3 (L)  06/16/2013 3.7   Sodium (mmol/L)  Date Value  03/22/2023 136  06/17/2013 137   Corrected Ca: 8.6 mg/dL  Assessment  Kyle Lora Sr. is a 83 y.o. male presenting with GIB. PMH significant for Crohn's disease, COPD, myasthenia gravis . Pharmacy has been consulted to monitor and replace electrolytes.  Diet: Clear liquids MIVF: None Pertinent medications: None  Goal of Therapy: Electrolytes WNL  Plan:  Give KCl 20 mEq PO x 1 No new Mg or Phos levels Check BMP, Mg, Phos with AM labs  Thank you for allowing pharmacy to be a part of this patient's care.  Merryl Hacker, PharmD Clinical Pharmacist 03/22/2023 10:51 AM

## 2023-03-22 NOTE — Progress Notes (Signed)
1      PROGRESS NOTE    Kyle Holloway  LZJ:[id number] DOB: 02/15/1940 DOA: 03/19/2023 PCP: Kandyce Rud, MD    Brief Narrative:   83 y.o. male with medical history significant of Crohn's disease, COPD, myasthenia gravis admitted for GI bleed  2/11: GI consult, EGD showed large hiatal hernia with single Cameron ulcer status post APC, gastritis 2/12: C-scope showed multiple diverticuli, colon filled with blood with more fresh blood on the left side - bleeding scan + 2/13: s/p emobolization by Vascular surgery, Gen Surgery c/s   2/14: Diet advanced to soft diet.  H&H has remained stable  Assessment & Plan:   Principal Problem:   Acute GI bleeding Active Problems:   Rectal bleeding   GI bleeding   Melena Lower/upper GI bleed Continue IV fluids N.p.o. for now. Hold aspirin. Continue IV Protonix EGD showed large hiatal hernia with single Cameron ulcer status post APC, gastritis C-scope showed multiple diverticuli, colon filled with blood with more fresh blood on the left side NM tagged pRBC scan on 2/12 showed the source to be the hepatic flexure. He underwent embolization today with Dr. Gilda Crease, but then once back in his room he had a lot of dark blood per rectum when going to the bathroom. Gen Surgery c/s -Tolerated clear and full liquid so advance to soft diet today.  No plans for surgery currently. H & H has remained stable   Acute blood loss anemia due to GI bleed 1 PRBC transfusion ordered for today. Will transfuse more if Hb < 7 H&H remained stable Avoid aspirin or any blood thinners    Latest Ref Rng & Units 03/22/2023    9:35 AM 03/22/2023    5:40 AM 03/22/2023    2:31 AM  CBC  WBC 4.0 - 10.5 K/uL  7.6    Hemoglobin 13.0 - 17.0 g/dL 8.7  8.4  8.3   Hematocrit 39.0 - 52.0 % 25.4  24.1  24.8   Platelets 150 - 400 K/uL  222       Crohn's disease: Resume balsalazide at discharge   Myasthenia gravis: Resume pyridostigmine therapy at discharge   COPD: No  exacerbation.  Bronchodilators as needed.   BPH: Continue finasteride, tamsulosin therapy at discharge   DVT prophylaxis: SCD's SCDs Start: 03/19/23 1806     Code Status: Full code Family Communication: Updated daughter and wife at bedside Disposition Plan: Possible discharge to home tomorrow depending on clinical condition and if no further bleed   Consultants:  GI Vascular Surgery Gen Surgery  Procedures:  EGD and colonoscopy GI Embolization    Subjective:  No further bleed.  H&H has remained stable.  Family at bedside and have been updated  Objective: Vitals:   03/21/23 1500 03/21/23 1932 03/22/23 0439 03/22/23 0746  BP: 125/81 (!) 149/97 129/87 137/85  Pulse: 92 97 99 88  Resp: 18 20 20 18   Temp: 98.4 F (36.9 C) 98 F (36.7 C) (!) 97.4 F (36.3 C) 98 F (36.7 C)  TempSrc: Oral Oral Oral Oral  SpO2: 94% 99% 96% 93%  Weight:      Height:        Intake/Output Summary (Last 24 hours) at 03/22/2023 1559 Last data filed at 03/22/2023 1500 Gross per 24 hour  Intake 360 ml  Output 150 ml  Net 210 ml   Filed Weights   03/19/23 0356 03/19/23 1327  Weight: 92.5 kg 92.5 kg    Examination:  General exam: Appears calm  and comfortable  Respiratory system: Clear to auscultation. Respiratory effort normal. Cardiovascular system: S1 & S2 heard, RRR. No JVD, murmurs, No pedal edema. Gastrointestinal system: Abdomen is soft, benign Central nervous system: Alert and oriented. No focal neurological deficits. Extremities: Symmetric 5 x 5 power. Skin: No rashes, lesions or ulcers Psychiatry: Judgement and insight appear normal. Mood & affect appropriate.     Data Reviewed: I have personally reviewed following labs and imaging studies  CBC: Recent Labs  Lab 03/19/23 0358 03/19/23 1839 03/21/23 0458 03/21/23 1601 03/21/23 1838 03/22/23 0231 03/22/23 0540 03/22/23 0935  WBC 8.0  --  6.8  --   --   --  7.6  --   NEUTROABS 4.1  --   --   --   --   --   --    --   HGB 13.7   < > 7.4* 8.6* 8.8* 8.3* 8.4* 8.7*  HCT 41.5   < > 21.6* 25.3* 25.9* 24.8* 24.1* 25.4*  MCV 97.2  --  95.2  --   --   --  91.6  --   PLT 292  --  194  --   --   --  222  --    < > = values in this interval not displayed.   Basic Metabolic Panel: Recent Labs  Lab 03/19/23 0358 03/20/23 0258 03/21/23 0458 03/22/23 0540  NA 137 138 138 136  K 3.8 3.5 3.8 3.3*  CL 108 110 110 108  CO2 16* 21* 22 22  GLUCOSE 101* 95 91 92  BUN 19 14 10  7*  CREATININE 1.04 0.85 1.02 0.87  CALCIUM 8.3* 7.8* 7.7* 8.0*   GFR: Estimated Creatinine Clearance: 74.8 mL/min (by C-G formula based on SCr of 0.87 mg/dL). Liver Function Tests: Recent Labs  Lab 03/19/23 0927  AST 22  ALT 14  ALKPHOS 43  BILITOT 0.7  PROT 6.4*  ALBUMIN 3.3*   No results for input(s): "LIPASE", "AMYLASE" in the last 168 hours. No results for input(s): "AMMONIA" in the last 168 hours. Coagulation Profile: Recent Labs  Lab 03/19/23 0744  INR 1.2     Radiology Studies: PERIPHERAL VASCULAR CATHETERIZATION Result Date: 03/21/2023 See surgical note for result.       Scheduled Meds:  pantoprazole (PROTONIX) IV  40 mg Intravenous Q12H   sodium chloride flush  3 mL Intravenous Q12H   Continuous Infusions:     LOS: 2 days    Time spent: 35 minutes    Delfino Lovett, MD [facility] Pager [phone]  If 7PM-7AM, please contact night-coverage [email or web address]  03/22/2023, 3:59 PM

## 2023-03-22 NOTE — Progress Notes (Signed)
[facility] SURGICAL PROGRESS NOTE (cpt [id number])  Hospital Day(s): 2.   Interval History: Patient seen and examined, no acute events or new complaints overnight. Patient reports he is doing well. No further episodes of bloody stool. He denies abdominal pain, nausea, emesis. Hgb has remained stable; 8.6 --> 8.8 --> 8.3 --> 8.4. He is without leukocytosis; 7.6K. Renal function normal; sCr - 0.87; UO - 150 ccs + unmeasured. Hypokalemia to 3.3. NPO this morning.  Review of Systems:  Constitutional: denies fever, chills  HEENT: denies cough or congestion  Respiratory: denies any shortness of breath  Cardiovascular: denies chest pain or palpitations  Gastrointestinal: denies abdominal pain, N/V Genitourinary: denies burning with urination or urinary frequency Musculoskeletal: denies pain, decreased motor or sensation  Vital signs in last 24 hours: [min-max] current  Temp:  [97.4 F (36.3 C)-98.4 F (36.9 C)] 97.4 F (36.3 C) (02/14 0439) Pulse Rate:  [0-108] 99 (02/14 0439) Resp:  [15-20] 20 (02/14 0439) BP: (113-157)/(44-97) 129/87 (02/14 0439) SpO2:  [89 %-100 %] 96 % (02/14 0439)     Height: 5\' 10"  (177.8 cm) Weight: 92.5 kg BMI (Calculated): 29.26   Intake/Output last 2 shifts:  02/13 0701 - 02/14 0700 In: 391 [Blood:391] Out: 150 [Urine:150]   Physical Exam:  Constitutional: alert, cooperative and no distress  HENT: normocephalic without obvious abnormality  Eyes: PERRL, EOM's grossly intact and symmetric  Respiratory: breathing non-labored at rest  Cardiovascular: regular rate and sinus rhythm  Gastrointestinal: soft, non-tender, and non-distended Musculoskeletal: no edema or wounds, motor and sensation grossly intact, NT    Labs:     Latest Ref Rng & Units 03/22/2023    5:40 AM 03/22/2023    2:31 AM 03/21/2023    6:38 PM  CBC  WBC 4.0 - 10.5 K/uL 7.6     Hemoglobin 13.0 - 17.0 g/dL 8.4  8.3  8.8   Hematocrit 39.0 - 52.0 % 24.1  24.8  25.9   Platelets  150 - 400 K/uL 222         Latest Ref Rng & Units 03/22/2023    5:40 AM 03/21/2023    4:58 AM 03/20/2023    2:58 AM  CMP  Glucose 70 - 99 mg/dL 92  91  95   BUN 8 - 23 mg/dL 7  10  14    Creatinine 0.61 - 1.24 mg/dL 1.91  4.78  2.95   Sodium 135 - 145 mmol/L 136  138  138   Potassium 3.5 - 5.1 mmol/L 3.3  3.8  3.5   Chloride 98 - 111 mmol/L 108  110  110   CO2 22 - 32 mmol/L 22  22  21    Calcium 8.9 - 10.3 mg/dL 8.0  7.7  7.8     Imaging studies: No new pertinent imaging studies   Assessment/Plan:  83 y.o. male with GI bleeding from hepatic flexure s/p embolization 02/13.               - We can restart diet and advance  - No need for surgical intervention; H&H and hemodynamics has been very stable. No further bleeding  - Monitor H&H; stable - Monitor abdominal examination; on-going bowel function               - Mobilize as tolerated - Appreciate vascular and GI efforts              - Further management per primary service; we will follow   All of the  above findings and recommendations were discussed with the patient, patient's family at bedside, and the medical team, and all of patient's and family's questions were answered to their expressed satisfaction.  -- Lynden Oxford, PA-C [facility] 03/22/2023, 7:18 AM M-F: 7am - 4pm

## 2023-03-22 NOTE — Progress Notes (Signed)
Progress Note    03/22/2023 2:12 PM 1 Day Post-Op  Subjective:  Kyle Holloway is an 83 yo male who presented to [facility] ER with GI bleeding. Patient has a history of Crohns disease and Myasthenia Gravis.  Patient this morning is now postop day 1 from GI embolization.  Patient is resting comfortably in bed.  No complaints overnight.  No bloody bowel movements noted overnight or this morning.  Vitals all remained stable.   Vitals:   03/22/23 0439 03/22/23 0746  BP: 129/87 137/85  Pulse: 99 88  Resp: 20 18  Temp: (!) 97.4 F (36.3 C) 98 F (36.7 C)  SpO2: 96% 93%   Physical Exam: Cardiac:  RRR. Normal S1, S2. No murmurs Lungs:  Clear throughout on auscultation. Non labored breathing.  Incisions:  None Extremities:  Palpable pulses throughout Abdomen:  Positive high pitched bowel sounds in left lower quadrant. Soft, non tender and non distended.  Neurologic: AAOX3, answers all questions and follows commands appropriately.   CBC    Component Value Date/Time   WBC 7.6 03/22/2023 0540   RBC 2.63 (L) 03/22/2023 0540   HGB 8.7 (L) 03/22/2023 0935   HGB 12.4 (L) 06/18/2013 0438   HCT 25.4 (L) 03/22/2023 0935   HCT 37.5 (L) 06/17/2013 0548   PLT 222 03/22/2023 0540   PLT 272 06/17/2013 0548   MCV 91.6 03/22/2023 0540   MCV 98 06/17/2013 0548   MCH 31.9 03/22/2023 0540   MCHC 34.9 03/22/2023 0540   RDW 15.8 (H) 03/22/2023 0540   RDW 14.8 (H) 06/17/2013 0548   LYMPHSABS 2.5 03/19/2023 0358   LYMPHSABS 0.7 (L) 06/17/2013 0548   MONOABS 1.0 03/19/2023 0358   MONOABS 0.2 06/17/2013 0548   EOSABS 0.3 03/19/2023 0358   EOSABS 0.0 06/17/2013 0548   BASOSABS 0.1 03/19/2023 0358   BASOSABS 0.0 06/17/2013 0548    BMET    Component Value Date/Time   NA 136 03/22/2023 0540   NA 137 06/17/2013 0548   K 3.3 (L) 03/22/2023 0540   K 4.3 06/17/2013 0548   CL 108 03/22/2023 0540   CL 110 (H) 06/17/2013 0548   CO2 22 03/22/2023 0540   CO2 21 06/17/2013 0548   GLUCOSE 92  03/22/2023 0540   GLUCOSE 147 (H) 06/17/2013 0548   BUN 7 (L) 03/22/2023 0540   BUN 17 06/17/2013 0548   CREATININE 0.87 03/22/2023 0540   CREATININE 0.94 06/17/2013 0548   CALCIUM 8.0 (L) 03/22/2023 0540   CALCIUM 8.4 (L) 06/17/2013 0548   GFRNONAA >60 03/22/2023 0540   GFRNONAA >60 06/17/2013 0548   GFRAA >60 01/02/2018 1011   GFRAA >60 06/17/2013 0548    INR    Component Value Date/Time   INR 1.2 03/19/2023 0744   INR 1.1 06/16/2013 1923     Intake/Output Summary (Last 24 hours) at 03/22/2023 1412 Last data filed at 03/22/2023 0749 Gross per 24 hour  Intake 391 ml  Output 150 ml  Net 241 ml     Assessment/Plan:  83 y.o. male is s/p GI embolization.  1 Day Post-Op   Plan Per vascular surgery recheck on patient's current hemoglobin has elevated from 8.4 at 5 AM this morning to 8.7 this afternoon.  Therefore vascular surgery is okay with patient discharge as soon as medically stable.  Patient does not appear to have any signs or symptoms of GI bleeding at this time.  Patient endorses a feeling well and has not had a bloody bowel movement today. Vascular  surgery will sign off at this time.  DVT prophylaxis: None due to GI bleeding.   Marcie Bal [facility] 03/22/2023 2:12 PM

## 2023-03-23 DIAGNOSIS — K625 Hemorrhage of anus and rectum: Secondary | ICD-10-CM | POA: Diagnosis not present

## 2023-03-23 LAB — BASIC METABOLIC PANEL
Anion gap: 7 (ref 5–15)
BUN: 8 mg/dL (ref 8–23)
CO2: 22 mmol/L (ref 22–32)
Calcium: 8.4 mg/dL — ABNORMAL LOW (ref 8.9–10.3)
Chloride: 108 mmol/L (ref 98–111)
Creatinine, Ser: 0.95 mg/dL (ref 0.61–1.24)
GFR, Estimated: >60 mL/min (ref >60)
Glucose, Bld: 93 mg/dL (ref 70–99)
Potassium: 3.6 mmol/L (ref 3.5–5.1)
Sodium: 137 mmol/L (ref 135–145)

## 2023-03-23 LAB — CBC
HCT: 24.1 % — ABNORMAL LOW (ref 39.0–52.0)
Hemoglobin: 8.4 g/dL — ABNORMAL LOW (ref 13.0–17.0)
MCH: 31.8 pg (ref 26.0–34.0)
MCHC: 34.9 g/dL (ref 30.0–36.0)
MCV: 91.3 fL (ref 80.0–100.0)
Platelets: 256 10*3/uL (ref 150–400)
RBC: 2.64 MIL/uL — ABNORMAL LOW (ref 4.22–5.81)
RDW: 15.9 % — ABNORMAL HIGH (ref 11.5–15.5)
WBC: 8.1 10*3/uL (ref 4.0–10.5)
nRBC: 0.4 % — ABNORMAL HIGH (ref 0.0–0.2)

## 2023-03-23 LAB — PHOSPHORUS: Phosphorus: 4.1 mg/dL (ref 2.5–4.6)

## 2023-03-23 LAB — MAGNESIUM: Magnesium: 1.9 mg/dL (ref 1.7–2.4)

## 2023-03-23 NOTE — Consult Note (Signed)
Medications delivered to patient.    Paschal Dopp, PharmD, BCPS

## 2023-03-23 NOTE — Progress Notes (Signed)
 PHARMACY CONSULT NOTE - ELECTROLYTES  Pharmacy Consult for Electrolyte Monitoring and Replacement   Recent Labs: Height: 5\' 10"  (177.8 cm) Weight: 92.5 kg (203 lb 14.8 oz) IBW/kg (Calculated) : 73 Estimated Creatinine Clearance: 68.5 mL/min (by C-G formula based on SCr of 0.95 mg/dL). Potassium (mmol/L)  Date Value  03/23/2023 3.6  06/17/2013 4.3   Magnesium (mg/dL)  Date Value  81/19/1478 1.9   Calcium (mg/dL)  Date Value  29/56/2130 8.4 (L)   Calcium, Total (mg/dL)  Date Value  86/57/8469 8.4 (L)   Albumin (g/dL)  Date Value  62/95/2841 3.3 (L)  06/16/2013 3.7   Phosphorus (mg/dL)  Date Value  32/44/0102 4.1   Sodium (mmol/L)  Date Value  03/23/2023 137  06/17/2013 137   Corrected Ca: 8.6 mg/dL  Assessment  Kyle Lora Sr. is a 83 y.o. male presenting with GIB. PMH significant for Crohn's disease, COPD, myasthenia gravis . Pharmacy has been consulted to monitor and replace electrolytes.  Diet: Clear liquids MIVF: None Pertinent medications: None  Goal of Therapy: Electrolytes WNL  Plan:  No replacement needed. Pharmacy to sign off.   Thank you for allowing pharmacy to be a part of this patient's care.  Ronnald Ramp, PharmD Clinical Pharmacist 03/23/2023 8:59 AM

## 2023-03-23 NOTE — Plan of Care (Signed)

## 2023-03-24 NOTE — Discharge Summary (Signed)
 Physician Discharge Summary   Patient: Kyle FLEISCHER Sr. MRN: [id number] DOB: 04/18/1940  Admit date:     03/19/2023  Discharge date: 03/23/2023  Discharge Physician: Delfino Lovett   PCP: Kandyce Rud, MD   Recommendations at discharge:   Follow-up with outpatient providers as requested  Discharge Diagnoses: Principal Problem:   Acute GI bleeding Active Problems:   Rectal bleeding   GI bleeding  Hospital Course: Assessment and Plan:  83 y.o. male with medical history significant of Crohn's disease, COPD, myasthenia gravis admitted for GI bleed   2/11: GI consult, EGD showed large hiatal hernia with single Cameron ulcer status post APC, gastritis 2/12: C-scope showed multiple diverticuli, colon filled with blood with more fresh blood on the left side - bleeding scan + 2/13: s/p emobolization by Vascular surgery, Gen Surgery c/s   2/14: Diet advanced to soft diet.  H&H has remained stable   Assessment & Plan:   Principal Problem:   Acute GI bleeding Active Problems:   Rectal bleeding   GI bleeding    Melena Lower/upper GI bleed EGD showed large hiatal hernia with single Sheria Lang ulcer status post APC, gastritis C-scope showed multiple diverticuli, colon filled with blood with more fresh blood on the left side NM tagged pRBC scan on 2/12 showed the source to be the hepatic flexure. He underwent embolization with Dr. Gilda Crease on 2/13, H&H has since remained stable.  Tolerated diet.  No further bleed   Acute blood loss anemia due to GI bleed Status post 1 PRBC transfusion     Crohn's disease: Myasthenia gravis: COPD: BPH: Stable.  Resume home medicine at discharge       Consultants: GI, general surgery, vascular surgery Procedures performed: EGD, colonoscopy, embolization of GI bleed Disposition: Home Diet recommendation:  Discharge Diet Orders (From admission, onward)     Start     Ordered   03/23/23 0000  Diet - low sodium heart healthy        03/23/23  0846           Carb modified diet DISCHARGE MEDICATION: Allergies as of 03/23/2023   No Known Allergies      Medication List     STOP taking these medications    Bromfenac Sodium 0.09 % Soln   valACYclovir 500 MG tablet Commonly known as: VALTREX       TAKE these medications    acetaminophen 325 MG tablet Commonly known as: TYLENOL Take 650 mg by mouth 3 (three) times daily as needed (pain).   albuterol 108 (90 Base) MCG/ACT inhaler Commonly known as: VENTOLIN HFA Inhale 2 puffs into the lungs every 6 (six) hours as needed for wheezing or shortness of breath.   aspirin EC 81 MG tablet Take 81 mg by mouth daily.   atenolol 25 MG tablet Commonly known as: TENORMIN Take 1 tablet by mouth daily.   balsalazide 750 MG capsule Commonly known as: COLAZAL Take 750 mg by mouth 3 (three) times daily.   Biotin 1 MG Caps Take 1 mg by mouth daily.   cholecalciferol 25 MCG (1000 UNIT) tablet Commonly known as: VITAMIN D3 Take 2,000 Units by mouth daily.   finasteride 5 MG tablet Commonly known as: PROSCAR Take 5 mg by mouth daily.   Fish Oil 1000 MG Caps Take by mouth.   folic acid 1 MG tablet Commonly known as: FOLVITE Take 1 mg by mouth daily.   methotrexate 2.5 MG tablet Commonly known as: RHEUMATREX Take 2 tablets (5 mg  total) by mouth once a week. Every Wednesday  Caution:Chemotherapy. Protect from light. What changed:  how much to take additional instructions   multivitamin with minerals tablet Take 1 tablet by mouth daily.   PRESERVISION AREDS PO Take 2 tablets by mouth daily.   omeprazole 40 MG capsule Commonly known as: PRILOSEC Take 1 capsule (40 mg total) by mouth 2 (two) times daily before a meal. What changed:  medication strength when to take this   prednisoLONE acetate 1 % ophthalmic suspension Commonly known as: PRED FORTE Place 1 drop into the left eye 2 (two) times daily.   pyridostigmine 60 MG tablet Commonly known as:  MESTINON Take 0.5 tablets (30 mg total) by mouth every 8 (eight) hours. What changed:  how much to take when to take this additional instructions   tamsulosin 0.4 MG Caps capsule Commonly known as: FLOMAX Take 0.4 mg by mouth daily.   vitamin B-12 500 MCG tablet Commonly known as: CYANOCOBALAMIN Take 1,000 mcg by mouth daily.        Follow-up Information     Kandyce Rud, MD. Schedule an appointment as soon as possible for a visit in 1 week(s).   Specialty: Family Medicine Why: [facility] Discharge F/UP Contact information: [address] [facility] [place] [address] [phone]         Midge Minium, MD. Schedule an appointment as soon as possible for a visit in 4 week(s).   Specialty: Gastroenterology Why: [facility] Discharge F/UP Contact information: [address] [place]  [place] [address] [phone]         Marcie Bal, NP. Schedule an appointment as soon as possible for a visit in 2 week(s).   Specialty: Vascular Surgery Why: [facility] Discharge F/UP Contact information: [address] [place] [address] [phone]                Discharge Exam: Ceasar Mons Weights   03/19/23 0356 03/19/23 1327  Weight: 92.5 kg 92.5 kg   General exam: Appears calm and comfortable  Respiratory system: Clear to auscultation. Respiratory effort normal. Cardiovascular system: S1 & S2 heard, RRR. No JVD, murmurs, No pedal edema. Gastrointestinal system: Abdomen is soft, benign Central nervous system: Alert and oriented. No focal neurological deficits. Extremities: Symmetric 5 x 5 power. Skin: No rashes, lesions or ulcers Psychiatry: Judgement and insight appear normal. Mood & affect appropriate.   Condition at discharge: good  The results of significant diagnostics from this hospitalization (including imaging, microbiology, ancillary and laboratory) are listed below for reference.    Imaging Studies: PERIPHERAL VASCULAR CATHETERIZATION Result Date: 03/21/2023 See surgical note for result.  NM GI Blood Loss Result Date: 03/20/2023 CLINICAL DATA:  Patient presents with bright red blood per rectum and dark stools. Anemia. EXAM: NUCLEAR MEDICINE GASTROINTESTINAL BLEEDING SCAN TECHNIQUE: Sequential abdominal images were obtained following intravenous administration of Tc-45m labeled red blood cells. RADIOPHARMACEUTICALS:  23.21 mCi Tc-72m pertechnetate in-vitro labeled red cells. COMPARISON:  03/19/2023 FINDINGS: During the first hour of imaging there is a focal area of increased radiotracer uptake localizing to the hepatic flexure. During the second hour of imaging radiotracer activity increases in intensity and exhibits antegrade progression along the transverse colon up to the sigmoid colon. IMPRESSION: 1. Examination is positive for active colonic bleeding. The site of bleeding originates at the level of the hepatic flexure/distal ascending colon. Electronically Signed   By: Signa Kell M.D.   On: 03/20/2023 15:55  CT ANGIO GI BLEED Result Date: 03/19/2023 CLINICAL DATA:  BRBPR, h/o Crohn's disease and diverticulosis. EXAM: CTA ABDOMEN AND PELVIS WITHOUT AND WITH CONTRAST TECHNIQUE: Multidetector CT imaging of the abdomen and pelvis was performed using the standard protocol during bolus administration of intravenous contrast. Multiplanar reconstructed images and MIPs were obtained and reviewed to evaluate the vascular anatomy. RADIATION DOSE REDUCTION: This exam was performed according to the departmental dose-optimization program which includes automated exposure control, adjustment of the mA and/or kV according to patient size and/or use of iterative reconstruction technique. CONTRAST:  OMNIPAQUE IOHEXOL 350 MG/ML SOLN COMPARISON:  CT scan renal stone protocol from 06/17/2020 and MRI abdomen from 12/12/2020. FINDINGS: VASCULAR Aorta: There is fusiform aneurysmal dilation  of infrarenal aorta just above the bifurcation measuring up to 2.5 x 3.5 cm orthogonally on axial plane. Recommend surveillance ultrasound in 3 years. Reference: Journal of Vascular Surgery 67.1 (2018): 2-77. J Am Coll Radiol [id number] [id number]. No aortic dissection, vasculitis, penetrating atherosclerotic ulcer or significant stenosis. Celiac: Patent without evidence of aneurysm, dissection, vasculitis or significant stenosis. SMA: Patent without evidence of aneurysm, dissection, vasculitis or significant stenosis. Renals: Both renal arteries are patent without evidence of aneurysm, dissection, vasculitis, fibromuscular dysplasia or significant stenosis. Accessory right renal artery noted, which is also patent. IMA: Patent without evidence of aneurysm, dissection, vasculitis or significant stenosis. Inflow: Patent without evidence of aneurysm, dissection, vasculitis or significant stenosis. Proximal Outflow: Bilateral common femoral and visualized portions of the superficial and profunda femoral arteries are patent without evidence of aneurysm, dissection, vasculitis or significant stenosis. Veins: No obvious venous abnormality within the limitations of this arterial phase study. Review of the MIP images confirms the above findings. NON-VASCULAR Lower chest: There are patchy atelectatic changes in the visualized lung bases. No overt consolidation. No pleural effusion. There are also moderate paraseptal emphysematous changes in the visualized bilateral lungs. The heart is normal in size. No pericardial effusion. Hepatobiliary: The liver is normal in size. Non-cirrhotic configuration. No suspicious mass. No intrahepatic or extrahepatic bile duct dilation. Small volume dependent gallstones noted without imaging signs of acute cholecystitis. Normal gallbladder wall thickness. No pericholecystic inflammatory changes. Pancreas: Unremarkable. No pancreatic ductal dilatation or surrounding inflammatory changes. Spleen: Within  normal limits. No focal lesion. Adrenals/Urinary Tract: Adrenal glands are unremarkable. No suspicious renal mass. There are scattered simple cysts in bilateral kidneys with largest nearly completely exophytic cyst arising from the right kidney lower pole measuring up to 2.3 x 2.4 cm. No hydronephrosis. No renal or ureteric calculi. Unremarkable urinary bladder. Stomach/Bowel: Note is made of moderate-to-large hiatal hernia. No disproportionate dilation of the small or large bowel loops. The appendix was not visualized; however there is no acute inflammatory process in the right lower quadrant. There are innumerable diverticula throughout the colon. There is subtle pericolonic fat stranding and prominence of vasa recta throughout the colon without definite colonic wall thickening, concerning for subacute/early pancolitis, most likely infective/inflammatory in etiology. No active extravasation of intravenous contrast noted to suggest active bleeding. Vascular/Lymphatic: No ascites or pneumoperitoneum. No abdominal or pelvic lymphadenopathy, by size criteria. There are moderate peripheral atherosclerotic vascular calcifications of the aorta and its major branches. Reproductive: Normal size prostate. Symmetric seminal vesicles. Other: There are fat containing umbilical and bilateral inguinal hernias. There are postsurgical changes in the bilateral inguinal region, from prior hernia repair. Musculoskeletal: No suspicious osseous lesions. There are mild multilevel degenerative changes in the visualized spine. IMPRESSION: 1. 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 There is subtle pericolonic fat stranding  and prominence of vasa recta throughout the colon without definite wall thickening, concerning for subacute/early pancolitis, most likely infective/inflammatory in etiology. No active extravasation of intravenous contrast to suggest active bleeding. 2. 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 Recommend surveillance ultrasound in 3 years. Reference: Journal of Vascular Surgery 67.1 (2018): 2-77. J Am Coll Radiol 2013;10:789-794. 3. Multiple other nonacute observations, as described above. Aortic Atherosclerosis (ICD10-I70.0) and Emphysema (ICD10-J43.9). 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 Electronically Signed   By: Jules Schick M.D.   On: 03/19/2023 10:28    Microbiology: Results for orders placed or performed during the hospital encounter of 11/07/21  Surgical pcr screen     Status: None   Collection Time: 11/07/21  1:52 PM   Specimen: Nasal Mucosa; Nasal Swab  Result Value Ref Range Status   MRSA, PCR NEGATIVE NEGATIVE Final   Staphylococcus aureus NEGATIVE NEGATIVE Final    Comment: (NOTE) The Xpert SA Assay (FDA approved for NASAL specimens in patients 51 years of age and older), is one component of a comprehensive surveillance program. It is not intended to diagnose infection nor to guide or monitor treatment. Performed at [facility], [address] [place], [place] [address]     Labs: CBC: Recent Labs  Lab 03/19/23 0358 03/19/23 1839 03/21/23 0458 03/21/23 1601 03/22/23 0231 03/22/23 0540 03/22/23 0935 03/22/23 1909 03/23/23 0432  WBC 8.0  --  6.8  --   --  7.6  --   --  8.1  NEUTROABS 4.1  --   --   --   --   --   --   --   --   HGB 13.7   < > 7.4*   < > 8.3* 8.4* 8.7* 8.6* 8.4*  HCT 41.5   < > 21.6*   < > 24.8* 24.1* 25.4* 25.3* 24.1*  MCV 97.2  --  95.2  --   --  91.6  --   --  91.3  PLT 292  --  194  --   --  222  --   --  256   < > = values in this interval not displayed.   Basic Metabolic Panel: Recent Labs  Lab 03/19/23 0358 03/20/23 0258 03/21/23 0458 03/22/23 0540 03/23/23 0432  NA 137 138 138 136 137  K 3.8 3.5 3.8 3.3* 3.6  CL 108 110 110 108 108  CO2 16* 21* 22 22 22   GLUCOSE 101* 95 91 92 93  BUN 19 14 10  7* 8  CREATININE 1.04 0.85 1.02  0.87 0.95  CALCIUM 8.3* 7.8* 7.7* 8.0* 8.4*  MG  --   --   --   --  1.9  PHOS  --   --   --   --  4.1   Liver Function Tests: Recent Labs  Lab 03/19/23 0927  AST 22  ALT 14  ALKPHOS 43  BILITOT 0.7  PROT 6.4*  ALBUMIN 3.3*   CBG: No results for input(s): "GLUCAP" in the last 168 hours.  Discharge time spent: greater than 30 minutes.  Signed: Delfino Lovett, MD [facility] 03/24/2023

## 2023-03-25 ENCOUNTER — Other Ambulatory Visit: Payer: Self-pay

## 2023-03-26 ENCOUNTER — Encounter: Payer: Self-pay | Admitting: Gastroenterology

## 2023-04-19 ENCOUNTER — Encounter (INDEPENDENT_AMBULATORY_CARE_PROVIDER_SITE_OTHER): Payer: Self-pay | Admitting: Nurse Practitioner

## 2023-04-19 ENCOUNTER — Ambulatory Visit (INDEPENDENT_AMBULATORY_CARE_PROVIDER_SITE_OTHER): Payer: Medicare Other | Admitting: Nurse Practitioner

## 2023-04-19 VITALS — BP 143/82 | HR 63 | Resp 16 | Wt 211.4 lb

## 2023-04-19 DIAGNOSIS — K922 Gastrointestinal hemorrhage, unspecified: Secondary | ICD-10-CM

## 2023-04-19 DIAGNOSIS — I10 Essential (primary) hypertension: Secondary | ICD-10-CM

## 2023-04-22 ENCOUNTER — Encounter (INDEPENDENT_AMBULATORY_CARE_PROVIDER_SITE_OTHER): Payer: Self-pay | Admitting: Nurse Practitioner

## 2023-04-22 NOTE — Progress Notes (Signed)
 Subjective:    Patient ID: Kyle Course., male    DOB: October 30, 1940, 83 y.o.   MRN: [id number] Chief Complaint  Patient presents with   Follow-up    [facility] 2 week follow up    Today the patient returns for follow-up after recent GI embolization on 03/21/2023.  Since his embolization he has not had any abdominal pain or significant issues with bleeding.  He denies any weakness or fatigue as a sign of continued bleeding.  He does endorse some diarrhea over the overall he is doing well.    Review of Systems  Gastrointestinal:  Negative for blood in stool.  All other systems reviewed and are negative.      Objective:   Physical Exam Vitals reviewed.  HENT:     Head: Normocephalic.  Cardiovascular:     Rate and Rhythm: Normal rate.     Pulses: Normal pulses.  Pulmonary:     Effort: Pulmonary effort is normal.  Skin:    General: Skin is warm and dry.  Neurological:     Mental Status: He is alert and oriented to person, place, and time.  Psychiatric:        Mood and Affect: Mood normal.        Behavior: Behavior normal.        Thought Content: Thought content normal.        Judgment: Judgment normal.     BP (!) 143/82   Pulse 63   Resp 16   Wt 211 lb 6.4 oz (95.9 kg)   BMI 30.33 kg/m   Past Medical History:  Diagnosis Date   Arthritis    COPD (chronic obstructive pulmonary disease) (HCC)    Crohn's disease (HCC)    Emphysema, unspecified (HCC)    Hypertension    Myasthenia gravis (HCC)     Social History   Socioeconomic History   Marital status: Married    Spouse name: Not on file   Number of children: Not on file   Years of education: Not on file   Highest education level: Not on file  Occupational History   Not on file  Tobacco Use   Smoking status: Former    Current packs/day: 0.00    Types: Cigarettes    Quit date: 1995    Years since quitting: 30.2   Smokeless tobacco: Never  Vaping Use   Vaping status: Never Used  Substance and Sexual  Activity   Alcohol use: Not Currently   Drug use: Never   Sexual activity: Not Currently  Other Topics Concern   Not on file  Social History Narrative   Not on file   Social Drivers of Health   Financial Resource Strain: Patient Declined (02/07/2023)   Received from [facility]   Overall Financial Resource Strain (CARDIA)    Difficulty of Paying Living Expenses: Patient declined  Food Insecurity: No Food Insecurity (03/19/2023)   Hunger Vital Sign    Worried About Running Out of Food in the Last Year: Never true    Ran Out of Food in the Last Year: Never true  Transportation Needs: Patient Declined (02/07/2023)   Received [facility]   PRAPARE - Transportation    In the past 12 months, has lack of transportation kept you from medical appointments or from getting medications?: Patient declined    Lack of Transportation (Non-Medical): Patient declined  Physical Activity: Not on file  Stress: Not on file  Social Connections:  Not on file  Intimate Partner Violence: Not on file    Past Surgical History:  Procedure Laterality Date   COLONOSCOPY WITH PROPOFOL N/A 03/20/2023   Procedure: COLONOSCOPY WITH PROPOFOL;  Surgeon: Midge Minium, MD;  Location: [facility] ENDOSCOPY;  Service: Endoscopy;  Laterality: N/A;   EMBOLIZATION (CATH LAB) N/A 03/21/2023   Procedure: EMBOLIZATION;  Surgeon: Renford Dills, MD;  Location: [facility] INVASIVE CV LAB;  Service: Cardiovascular;  Laterality: N/A;   ESOPHAGOGASTRODUODENOSCOPY N/A 03/19/2023   Procedure: ESOPHAGOGASTRODUODENOSCOPY (EGD);  Surgeon: Midge Minium, MD;  Location: [facility] ENDOSCOPY;  Service: Endoscopy;  Laterality: N/A;   JOINT REPLACEMENT Left    total knee replacement    Dr. Ernest Pine   TONSILLECTOMY     TOTAL KNEE ARTHROPLASTY Right 11/20/2021   Procedure: RIGHT TOTAL KNEE ARTHROPLASTY;  Surgeon: Gean Birchwood, MD;  Location: [facility] ORS;  Service: Orthopedics;  Laterality: Right;   WISDOM TOOTH EXTRACTION       Family History  Problem Relation Age of Onset   Brain cancer Father     No Known Allergies     Latest Ref Rng & Units 03/23/2023    4:32 AM 03/22/2023    7:09 PM 03/22/2023    9:35 AM  CBC  WBC 4.0 - 10.5 K/uL 8.1     Hemoglobin 13.0 - 17.0 g/dL 8.4  8.6  8.7   Hematocrit 39.0 - 52.0 % 24.1  25.3  25.4   Platelets 150 - 400 K/uL 256         CMP     Component Value Date/Time   NA 137 03/23/2023 0432   NA 137 06/17/2013 0548   K 3.6 03/23/2023 0432   K 4.3 06/17/2013 0548   CL 108 03/23/2023 0432   CL 110 (H) 06/17/2013 0548   CO2 22 03/23/2023 0432   CO2 21 06/17/2013 0548   GLUCOSE 93 03/23/2023 0432   GLUCOSE 147 (H) 06/17/2013 0548   BUN 8 03/23/2023 0432   BUN 17 06/17/2013 0548   CREATININE 0.95 03/23/2023 0432   CREATININE 0.94 06/17/2013 0548   CALCIUM 8.4 (L) 03/23/2023 0432   CALCIUM 8.4 (L) 06/17/2013 0548   PROT 6.4 (L) 03/19/2023 0927   PROT 8.1 06/16/2013 1923   ALBUMIN 3.3 (L) 03/19/2023 0927   ALBUMIN 3.7 06/16/2013 1923   AST 22 03/19/2023 0927   AST 14 (L) 06/16/2013 1923   ALT 14 03/19/2023 0927   ALT 22 06/16/2013 1923   ALKPHOS 43 03/19/2023 0927   ALKPHOS 62 06/16/2013 1923   BILITOT 0.7 03/19/2023 0927   BILITOT 0.6 06/16/2013 1923   GFRNONAA >60 03/23/2023 0432   GFRNONAA >60 06/17/2013 0548     No results found.     Assessment & Plan:   1. Gastrointestinal hemorrhage, unspecified gastrointestinal hemorrhage type (Primary) Currently the patient has had no further episodes of GI bleeding.  No episodes of weakness.  Based on this no further intervention is recommended at this time.  Patient will continue to follow with PCP for ongoing lab management.  2. Benign essential hypertension Continue antihypertensive medications as already ordered, these medications have been reviewed and there are no changes at this time.   Current Outpatient Medications on File Prior to Visit  Medication Sig Dispense Refill   acetaminophen  (TYLENOL) 325 MG tablet Take 650 mg by mouth 3 (three) times daily as needed (pain).     albuterol (PROVENTIL HFA;VENTOLIN HFA) 108 (90 Base) MCG/ACT inhaler Inhale 2 puffs into the lungs every 6 (  six) hours as needed for wheezing or shortness of breath. 1 Inhaler 0   aspirin EC 81 MG tablet Take 81 mg by mouth daily.      atenolol (TENORMIN) 25 MG tablet Take 1 tablet by mouth daily.     balsalazide (COLAZAL) 750 MG capsule Take 750 mg by mouth 3 (three) times daily.  3   Biotin 1 MG CAPS Take 1 mg by mouth daily.     cholecalciferol (VITAMIN D3) 25 MCG (1000 UNIT) tablet Take 2,000 Units by mouth daily.     finasteride (PROSCAR) 5 MG tablet Take 5 mg by mouth daily.  3   folic acid (FOLVITE) 1 MG tablet Take 1 mg by mouth daily.     methotrexate (RHEUMATREX) 2.5 MG tablet Take 2 tablets (5 mg total) by mouth once a week. Every Wednesday  Caution:Chemotherapy. Protect from light. (Patient taking differently: Take 10 mg by mouth once a week. On Wednesday.) 4 tablet 0   Multiple Vitamins-Minerals (MULTIVITAMIN WITH MINERALS) tablet Take 1 tablet by mouth daily.     Multiple Vitamins-Minerals (PRESERVISION AREDS PO) Take 2 tablets by mouth daily.     Omega-3 Fatty Acids (FISH OIL) 1000 MG CAPS Take by mouth.     omeprazole (PRILOSEC) 40 MG capsule Take 1 capsule (40 mg total) by mouth 2 (two) times daily before a meal. 60 capsule 0   prednisoLONE acetate (PRED FORTE) 1 % ophthalmic suspension Place 1 drop into the left eye 2 (two) times daily.     pyridostigmine (MESTINON) 60 MG tablet Take 0.5 tablets (30 mg total) by mouth every 8 (eight) hours. (Patient taking differently: Take 60 mg by mouth 3 (three) times daily. See Rx fill hx and office visit note 01/18/23) 90 tablet 0   tamsulosin (FLOMAX) 0.4 MG CAPS capsule Take 0.4 mg by mouth daily.  3   vitamin B-12 (CYANOCOBALAMIN) 500 MCG tablet Take 1,000 mcg by mouth daily.     Current Facility-Administered Medications on File Prior to Visit   Medication Dose Route Frequency Provider Last Rate Last Admin   albuterol (PROVENTIL) (2.5 MG/3ML) 0.083% nebulizer solution 2.5 mg  2.5 mg Nebulization Once Shane Crutch, MD        There are no Patient Instructions on file for this visit. No follow-ups on file.   Georgiana Spinner, NP

## 2023-05-31 ENCOUNTER — Inpatient Hospital Stay: Attending: Internal Medicine | Admitting: Internal Medicine

## 2023-05-31 ENCOUNTER — Inpatient Hospital Stay

## 2023-05-31 ENCOUNTER — Encounter: Payer: Self-pay | Admitting: Internal Medicine

## 2023-05-31 VITALS — BP 127/72 | HR 72 | Temp 97.7°F | Resp 18 | Wt 207.0 lb

## 2023-05-31 DIAGNOSIS — D500 Iron deficiency anemia secondary to blood loss (chronic): Secondary | ICD-10-CM | POA: Diagnosis present

## 2023-05-31 DIAGNOSIS — D649 Anemia, unspecified: Secondary | ICD-10-CM | POA: Insufficient documentation

## 2023-05-31 DIAGNOSIS — K922 Gastrointestinal hemorrhage, unspecified: Secondary | ICD-10-CM | POA: Diagnosis present

## 2023-05-31 NOTE — Progress Notes (Signed)
 [facility] CONSULT NOTE  Patient Care Team: Nestor Banter, MD as PCP - General (Family Medicine) Gwyn Leos, MD as Consulting Physician (Oncology) Gwyn Leos, MD as Consulting Physician (Hematology and Oncology)  CHIEF COMPLAINTS/PURPOSE OF CONSULTATION: ANEMIA   HEMATOLOGY HISTORY  # ANEMIA[Hb; MCV-platelets- WBC; Iron  sat; ferritin;  GFR- CT/US - ;   HISTORY OF PRESENTING ILLNESS:  Kyle Kotyk Sr. 83 y.o.  male pleasant patient PMH of HTN, HLD, ocular MG, GERD, BPH, and COPD presents to the [facility] for follow-up of Crohn's colitis in clinical  remission referred to us  for further evaluation of anemia.  Of note patient diagnosed with GI bleed-in February 2025.  Needing embolization.   Patient has been on oral iron .  However notes to have worsening shortness of breath.  Extreme fatigue.  Denies any weight loss.  Review of Systems  Constitutional:  Positive for malaise/fatigue. Negative for chills, diaphoresis, fever and weight loss.  HENT:  Negative for nosebleeds and sore throat.   Eyes:  Negative for double vision.  Respiratory:  Positive for shortness of breath. Negative for cough, hemoptysis, sputum production and wheezing.   Cardiovascular:  Negative for chest pain, palpitations, orthopnea and leg swelling.  Gastrointestinal:  Negative for abdominal pain, blood in stool, constipation, diarrhea, heartburn, melena, nausea and vomiting.  Genitourinary:  Negative for dysuria, frequency and urgency.  Musculoskeletal:  Negative for back pain and joint pain.  Skin: Negative.  Negative for itching and rash.  Neurological:  Negative for dizziness, tingling, focal weakness, weakness and headaches.  Endo/Heme/Allergies:  Does not bruise/bleed easily.  Psychiatric/Behavioral:  Negative for depression. The patient is not nervous/anxious and does not have insomnia.      MEDICAL HISTORY:  Past Medical History:  Diagnosis Date    Arthritis    COPD (chronic obstructive pulmonary disease) (HCC)    Crohn's disease (HCC)    Emphysema, unspecified (HCC)    Hypertension    Myasthenia gravis (HCC)     SURGICAL HISTORY: Past Surgical History:  Procedure Laterality Date   COLONOSCOPY WITH PROPOFOL  N/A 03/20/2023   Procedure: COLONOSCOPY WITH PROPOFOL ;  Surgeon: Marnee Sink, MD;  Location: [facility] ENDOSCOPY;  Service: Endoscopy;  Laterality: N/A;   EMBOLIZATION (CATH LAB) N/A 03/21/2023   Procedure: EMBOLIZATION;  Surgeon: Jackquelyn Mass, MD;  Location: [facility] INVASIVE CV LAB;  Service: Cardiovascular;  Laterality: N/A;   ESOPHAGOGASTRODUODENOSCOPY N/A 03/19/2023   Procedure: ESOPHAGOGASTRODUODENOSCOPY (EGD);  Surgeon: Marnee Sink, MD;  Location: [facility] ENDOSCOPY;  Service: Endoscopy;  Laterality: N/A;   JOINT REPLACEMENT Left    total knee replacement    Dr. Aubry Blase   TONSILLECTOMY     TOTAL KNEE ARTHROPLASTY Right 11/20/2021   Procedure: RIGHT TOTAL KNEE ARTHROPLASTY;  Surgeon: Wendolyn Hamburger, MD;  Location: [facility] ORS;  Service: Orthopedics;  Laterality: Right;   WISDOM TOOTH EXTRACTION      SOCIAL HISTORY: Social History   Socioeconomic History   Marital status: Married    Spouse name: Not on file   Number of children: Not on file   Years of education: Not on file   Highest education level: Not on file  Occupational History   Not on file  Tobacco Use   Smoking status: Former    Current packs/day: 1.50    Types: Cigarettes   Smokeless tobacco: Never  Vaping Use   Vaping status: Never Used  Substance and Sexual Activity   Alcohol use: Not Currently   Drug use: Never   Sexual  activity: Not Currently  Other Topics Concern   Not on file  Social History Narrative   Not on file   Social Drivers of Health   Financial Resource Strain: Patient Declined (02/07/2023)   [facility]   Overall Financial Resource Strain (CARDIA)    Difficulty of Paying Living Expenses: Patient declined   Food Insecurity: No Food Insecurity (05/31/2023)   Hunger Vital Sign    Worried About Running Out of Food in the Last Year: Never true    Ran Out of Food in the Last Year: Never true  Transportation Needs: No Transportation Needs (05/31/2023)   PRAPARE - Administrator, Civil Service (Medical): No    Lack of Transportation (Non-Medical): No  Physical Activity: Not on file  Stress: Not on file  Social Connections: Not on file  Intimate Partner Violence: Not At Risk (05/31/2023)   Humiliation, Afraid, Rape, and Kick questionnaire    Fear of Current or Ex-Partner: No    Emotionally Abused: No    Physically Abused: No    Sexually Abused: No    FAMILY HISTORY: Family History  Problem Relation Age of Onset   Brain cancer Father     ALLERGIES:  has no known allergies.  MEDICATIONS:  Current Outpatient Medications  Medication Sig Dispense Refill   acetaminophen  (TYLENOL ) 325 MG tablet Take 650 mg by mouth 3 (three) times daily as needed (pain).     albuterol  (PROVENTIL  HFA;VENTOLIN  HFA) 108 (90 Base) MCG/ACT inhaler Inhale 2 puffs into the lungs every 6 (six) hours as needed for wheezing or shortness of breath. 1 Inhaler 0   aspirin  EC 81 MG tablet Take 81 mg by mouth daily.      balsalazide (COLAZAL ) 750 MG capsule Take 750 mg by mouth 3 (three) times daily.  3   Biotin 1 MG CAPS Take 1 mg by mouth daily.     cholecalciferol (VITAMIN D3) 25 MCG (1000 UNIT) tablet Take 2,000 Units by mouth daily.     finasteride  (PROSCAR ) 5 MG tablet Take 5 mg by mouth daily.  3   folic acid  (FOLVITE ) 1 MG tablet Take 1 mg by mouth daily.     methotrexate  (RHEUMATREX) 2.5 MG tablet Take 2 tablets (5 mg total) by mouth once a week. Every Wednesday  Caution:Chemotherapy. Protect from light. (Patient taking differently: Take 10 mg by mouth once a week. On Wednesday.) 4 tablet 0   Multiple Vitamins-Minerals (MULTIVITAMIN WITH MINERALS) tablet Take 1 tablet by mouth daily.     Omega-3 Fatty  Acids (FISH OIL) 1000 MG CAPS Take by mouth.     omeprazole  (PRILOSEC) 40 MG capsule Take 1 capsule (40 mg total) by mouth 2 (two) times daily before a meal. 60 capsule 0   prednisoLONE acetate (PRED FORTE) 1 % ophthalmic suspension Place 1 drop into the left eye 2 (two) times daily.     pyridostigmine  (MESTINON ) 60 MG tablet Take 0.5 tablets (30 mg total) by mouth every 8 (eight) hours. (Patient taking differently: Take 60 mg by mouth 3 (three) times daily. See Rx fill hx and office visit note 01/18/23) 90 tablet 0   tamsulosin  (FLOMAX ) 0.4 MG CAPS capsule Take 0.4 mg by mouth daily.  3   vitamin B-12 (CYANOCOBALAMIN ) 500 MCG tablet Take 1,000 mcg by mouth daily.     atenolol (TENORMIN) 25 MG tablet Take 1 tablet by mouth daily.     Multiple Vitamins-Minerals (PRESERVISION AREDS PO) Take 2 tablets by  mouth daily.     No current facility-administered medications for this visit.   Facility-Administered Medications Ordered in Other Visits  Medication Dose Route Frequency Provider Last Rate Last Admin   albuterol  (PROVENTIL ) (2.5 MG/3ML) 0.083% nebulizer solution 2.5 mg  2.5 mg Nebulization Once Enos Harts, MD         PHYSICAL EXAMINATION:   Vitals:   05/31/23 1356  BP: 127/72  Pulse: 72  Resp: 18  Temp: 97.7 F (36.5 C)  SpO2: 99%   Filed Weights   05/31/23 1356  Weight: 207 lb (93.9 kg)    Physical Exam Vitals and nursing note reviewed.  HENT:     Head: Normocephalic and atraumatic.     Mouth/Throat:     Pharynx: Oropharynx is clear.  Eyes:     Extraocular Movements: Extraocular movements intact.     Pupils: Pupils are equal, round, and reactive to light.  Cardiovascular:     Rate and Rhythm: Normal rate and regular rhythm.  Pulmonary:     Comments: Decreased breath sounds bilaterally.  Abdominal:     Palpations: Abdomen is soft.  Musculoskeletal:        General: Normal range of motion.     Cervical back: Normal range of motion.  Skin:    General: Skin  is warm.  Neurological:     General: No focal deficit present.     Mental Status: He is alert and oriented to person, place, and time.  Psychiatric:        Behavior: Behavior normal.        Judgment: Judgment normal.      LABORATORY DATA:  I have reviewed the data as listed Lab Results  Component Value Date   WBC 8.1 03/23/2023   HGB 8.4 (L) 03/23/2023   HCT 24.1 (L) 03/23/2023   MCV 91.3 03/23/2023   PLT 256 03/23/2023   Recent Labs    03/19/23 0927 03/20/23 0258 03/21/23 0458 03/22/23 0540 03/23/23 0432  NA  --    < > 138 136 137  K  --    < > 3.8 3.3* 3.6  CL  --    < > 110 108 108  CO2  --    < > 22 22 22   GLUCOSE  --    < > 91 92 93  BUN  --    < > 10 7* 8  CREATININE  --    < > 1.02 0.87 0.95  CALCIUM  --    < > 7.7* 8.0* 8.4*  GFRNONAA  --    < > >60 >60 >60  PROT 6.4*  --   --   --   --   ALBUMIN 3.3*  --   --   --   --   AST 22  --   --   --   --   ALT 14  --   --   --   --   ALKPHOS 43  --   --   --   --   BILITOT 0.7  --   --   --   --   BILIDIR 0.1  --   --   --   --   IBILI 0.6  --   --   --   --    < > = values in this interval not displayed.    No results found.  ASSESSMENT & PLAN:   Symptomatic anemia # Anemia- Hb-symptomatic.  Likely due to iron  deficiency - from etiology GI blood loss-hb-  8.6, but still anemia with evidence of iron-deficiency with ferritin 6, serum iron 14, TIBC 441, and iron sat 3% .    # Discussed regarding IV iron infusion/Venofer. Discussed the potential acute infusion reactions with IV iron; which are quite rare.  Patient understands the risk; will proceed with infusions.   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 He  underwent embolization with Dr. Prescilla Brod on 2/13- stable; on PO iron [black stool]  # Hx of MG [on MXT- pyridostimine]- stable.  Thank you Mr.Croley for allowing me to participate in the care of your pleasant patient. Please do not hesitate to contact me with questions or concerns in the interim.  # DISPOSITION: # NO labs today # weekly venofer x4 - please start ASAP # follow up 2 months- MD; labs- cbc/bmp; iron studies; ferritin-possible venofer- Dr.B    All questions were answered. The patient knows to call the clinic with any problems, questions or concerns.    Gwyn Leos, MD 05/31/2023 3:09 PM

## 2023-05-31 NOTE — Progress Notes (Signed)
 Patient here as new Hem patient Pcp due to low hemoglobin, also concerned with low Iron

## 2023-05-31 NOTE — Assessment & Plan Note (Addendum)
#   Anemia- Hb-symptomatic.  Likely due to iron deficiency - from etiology GI blood loss-hb-  8.6, but still anemia with evidence of iron-deficiency with ferritin 6, serum iron 14, TIBC 441, and iron sat 3% .    # Discussed regarding IV iron infusion/Venofer. Discussed the potential acute infusion reactions with IV iron; which are quite rare.  Patient understands the risk; will proceed with infusions.   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 He underwent embolization with Dr. Prescilla Brod on 2/13- stable; on PO iron [black stool]  # Hx of MG [on MXT- pyridostimine]- stable.  Thank you Mr.Croley for allowing me to participate in the care of your pleasant patient. Please do not hesitate to contact me with questions or concerns in the interim.  # DISPOSITION: # NO labs today # weekly venofer x4 - please start ASAP # follow up 2 months- MD; labs- cbc/bmp; iron studies; ferritin-possible venofer- Dr.B

## 2023-06-04 ENCOUNTER — Inpatient Hospital Stay

## 2023-06-04 VITALS — BP 128/86 | HR 62 | Resp 18

## 2023-06-04 DIAGNOSIS — D649 Anemia, unspecified: Secondary | ICD-10-CM

## 2023-06-04 DIAGNOSIS — D500 Iron deficiency anemia secondary to blood loss (chronic): Secondary | ICD-10-CM | POA: Diagnosis not present

## 2023-06-04 MED ORDER — IRON SUCROSE 20 MG/ML IV SOLN
200.0000 mg | Freq: Once | INTRAVENOUS | Status: AC
  Administered 2023-06-04: 200 mg via INTRAVENOUS

## 2023-06-04 MED ORDER — SODIUM CHLORIDE 0.9% FLUSH
10.0000 mL | Freq: Once | INTRAVENOUS | Status: DC | PRN
  Filled 2023-06-04: qty 10

## 2023-06-04 MED ORDER — ALTEPLASE 2 MG IJ SOLR
2.0000 mg | Freq: Once | INTRAMUSCULAR | Status: DC | PRN
  Filled 2023-06-04: qty 2

## 2023-06-04 MED ORDER — HEPARIN SOD (PORK) LOCK FLUSH 100 UNIT/ML IV SOLN
250.0000 [IU] | Freq: Once | INTRAVENOUS | Status: DC | PRN
  Filled 2023-06-04: qty 5

## 2023-06-04 MED ORDER — HEPARIN SOD (PORK) LOCK FLUSH 100 UNIT/ML IV SOLN
500.0000 [IU] | Freq: Once | INTRAVENOUS | Status: DC | PRN
  Filled 2023-06-04: qty 5

## 2023-06-04 MED ORDER — SODIUM CHLORIDE 0.9% FLUSH
3.0000 mL | Freq: Once | INTRAVENOUS | Status: DC | PRN
  Filled 2023-06-04: qty 3

## 2023-06-04 NOTE — Patient Instructions (Signed)

## 2023-06-11 ENCOUNTER — Inpatient Hospital Stay: Attending: Internal Medicine

## 2023-06-11 VITALS — BP 125/90 | HR 67 | Temp 97.5°F | Resp 18

## 2023-06-11 DIAGNOSIS — D649 Anemia, unspecified: Secondary | ICD-10-CM

## 2023-06-11 DIAGNOSIS — D500 Iron deficiency anemia secondary to blood loss (chronic): Secondary | ICD-10-CM | POA: Diagnosis present

## 2023-06-11 DIAGNOSIS — K922 Gastrointestinal hemorrhage, unspecified: Secondary | ICD-10-CM | POA: Diagnosis present

## 2023-06-11 MED ORDER — IRON SUCROSE 20 MG/ML IV SOLN
200.0000 mg | Freq: Once | INTRAVENOUS | Status: AC
  Administered 2023-06-11: 200 mg via INTRAVENOUS

## 2023-06-11 NOTE — Patient Instructions (Signed)

## 2023-06-18 ENCOUNTER — Inpatient Hospital Stay

## 2023-06-18 VITALS — BP 129/76 | HR 67 | Temp 97.5°F | Resp 18

## 2023-06-18 DIAGNOSIS — D500 Iron deficiency anemia secondary to blood loss (chronic): Secondary | ICD-10-CM | POA: Diagnosis not present

## 2023-06-18 DIAGNOSIS — D649 Anemia, unspecified: Secondary | ICD-10-CM

## 2023-06-18 MED ORDER — SODIUM CHLORIDE 0.9% FLUSH
10.0000 mL | Freq: Once | INTRAVENOUS | Status: AC | PRN
  Administered 2023-06-18: 10 mL
  Filled 2023-06-18: qty 10

## 2023-06-18 MED ORDER — IRON SUCROSE 20 MG/ML IV SOLN
200.0000 mg | Freq: Once | INTRAVENOUS | Status: AC
  Administered 2023-06-18: 200 mg via INTRAVENOUS

## 2023-06-25 ENCOUNTER — Inpatient Hospital Stay

## 2023-06-25 VITALS — BP 120/71 | HR 66 | Temp 98.2°F | Resp 18

## 2023-06-25 DIAGNOSIS — D649 Anemia, unspecified: Secondary | ICD-10-CM

## 2023-06-25 DIAGNOSIS — D500 Iron deficiency anemia secondary to blood loss (chronic): Secondary | ICD-10-CM | POA: Diagnosis not present

## 2023-06-25 MED ORDER — IRON SUCROSE 20 MG/ML IV SOLN
200.0000 mg | Freq: Once | INTRAVENOUS | Status: AC
  Administered 2023-06-25: 200 mg via INTRAVENOUS

## 2023-06-25 MED ORDER — SODIUM CHLORIDE 0.9% FLUSH
10.0000 mL | Freq: Once | INTRAVENOUS | Status: AC | PRN
  Administered 2023-06-25: 10 mL
  Filled 2023-06-25: qty 10

## 2023-07-20 ENCOUNTER — Emergency Department

## 2023-07-20 ENCOUNTER — Encounter (HOSPITAL_COMMUNITY): Payer: Self-pay

## 2023-07-20 ENCOUNTER — Emergency Department
Admission: EM | Admit: 2023-07-20 | Discharge: 2023-07-20 | Disposition: A | Discharge status: Home / Self Care | Attending: Emergency Medicine | Admitting: Emergency Medicine

## 2023-07-20 ENCOUNTER — Inpatient Hospital Stay (HOSPITAL_COMMUNITY)
Admission: EM | Admit: 2023-07-20 | Discharge: 2023-07-26 | DRG: 853 | Disposition: A | Discharge status: Home / Self Care | Source: Other Acute Inpatient Hospital | Attending: Internal Medicine | Admitting: Internal Medicine

## 2023-07-20 DIAGNOSIS — J4489 Other specified chronic obstructive pulmonary disease: Secondary | ICD-10-CM | POA: Diagnosis present

## 2023-07-20 DIAGNOSIS — R7989 Other specified abnormal findings of blood chemistry: Secondary | ICD-10-CM | POA: Diagnosis not present

## 2023-07-20 DIAGNOSIS — Z87891 Personal history of nicotine dependence: Secondary | ICD-10-CM

## 2023-07-20 DIAGNOSIS — K8001 Calculus of gallbladder with acute cholecystitis with obstruction: Secondary | ICD-10-CM | POA: Diagnosis not present

## 2023-07-20 DIAGNOSIS — K831 Obstruction of bile duct: Secondary | ICD-10-CM | POA: Insufficient documentation

## 2023-07-20 DIAGNOSIS — R6520 Severe sepsis without septic shock: Principal | ICD-10-CM | POA: Diagnosis present

## 2023-07-20 DIAGNOSIS — K449 Diaphragmatic hernia without obstruction or gangrene: Secondary | ICD-10-CM | POA: Diagnosis present

## 2023-07-20 DIAGNOSIS — K5090 Crohn's disease, unspecified, without complications: Secondary | ICD-10-CM | POA: Diagnosis present

## 2023-07-20 DIAGNOSIS — K8067 Calculus of gallbladder and bile duct with acute and chronic cholecystitis with obstruction: Secondary | ICD-10-CM | POA: Diagnosis present

## 2023-07-20 DIAGNOSIS — B029 Zoster without complications: Secondary | ICD-10-CM | POA: Diagnosis not present

## 2023-07-20 DIAGNOSIS — K819 Cholecystitis, unspecified: Secondary | ICD-10-CM | POA: Insufficient documentation

## 2023-07-20 DIAGNOSIS — K8010 Calculus of gallbladder with chronic cholecystitis without obstruction: Secondary | ICD-10-CM | POA: Diagnosis not present

## 2023-07-20 DIAGNOSIS — J449 Chronic obstructive pulmonary disease, unspecified: Secondary | ICD-10-CM | POA: Diagnosis not present

## 2023-07-20 DIAGNOSIS — Z79631 Long term (current) use of antimetabolite agent: Secondary | ICD-10-CM

## 2023-07-20 DIAGNOSIS — I7143 Infrarenal abdominal aortic aneurysm, without rupture: Secondary | ICD-10-CM | POA: Diagnosis present

## 2023-07-20 DIAGNOSIS — K861 Other chronic pancreatitis: Secondary | ICD-10-CM | POA: Diagnosis present

## 2023-07-20 DIAGNOSIS — A419 Sepsis, unspecified organism: Secondary | ICD-10-CM | POA: Insufficient documentation

## 2023-07-20 DIAGNOSIS — E876 Hypokalemia: Secondary | ICD-10-CM | POA: Diagnosis present

## 2023-07-20 DIAGNOSIS — K8309 Other cholangitis: Secondary | ICD-10-CM | POA: Insufficient documentation

## 2023-07-20 DIAGNOSIS — Z7982 Long term (current) use of aspirin: Secondary | ICD-10-CM | POA: Diagnosis not present

## 2023-07-20 DIAGNOSIS — K219 Gastro-esophageal reflux disease without esophagitis: Secondary | ICD-10-CM | POA: Diagnosis present

## 2023-07-20 DIAGNOSIS — J439 Emphysema, unspecified: Secondary | ICD-10-CM | POA: Diagnosis present

## 2023-07-20 DIAGNOSIS — R778 Other specified abnormalities of plasma proteins: Secondary | ICD-10-CM | POA: Diagnosis not present

## 2023-07-20 DIAGNOSIS — E871 Hypo-osmolality and hyponatremia: Secondary | ICD-10-CM | POA: Diagnosis present

## 2023-07-20 DIAGNOSIS — K8062 Calculus of gallbladder and bile duct with acute cholecystitis without obstruction: Secondary | ICD-10-CM | POA: Diagnosis not present

## 2023-07-20 DIAGNOSIS — Z79899 Other long term (current) drug therapy: Secondary | ICD-10-CM | POA: Insufficient documentation

## 2023-07-20 DIAGNOSIS — K5010 Crohn's disease of large intestine without complications: Secondary | ICD-10-CM | POA: Diagnosis present

## 2023-07-20 DIAGNOSIS — N400 Enlarged prostate without lower urinary tract symptoms: Secondary | ICD-10-CM | POA: Diagnosis present

## 2023-07-20 DIAGNOSIS — R109 Unspecified abdominal pain: Secondary | ICD-10-CM | POA: Diagnosis present

## 2023-07-20 DIAGNOSIS — I2489 Other forms of acute ischemic heart disease: Secondary | ICD-10-CM | POA: Diagnosis present

## 2023-07-20 DIAGNOSIS — G7000 Myasthenia gravis without (acute) exacerbation: Secondary | ICD-10-CM | POA: Diagnosis present

## 2023-07-20 DIAGNOSIS — Z743 Need for continuous supervision: Secondary | ICD-10-CM | POA: Diagnosis not present

## 2023-07-20 DIAGNOSIS — D72829 Elevated white blood cell count, unspecified: Secondary | ICD-10-CM | POA: Diagnosis not present

## 2023-07-20 DIAGNOSIS — R5383 Other fatigue: Secondary | ICD-10-CM | POA: Diagnosis present

## 2023-07-20 DIAGNOSIS — K851 Biliary acute pancreatitis without necrosis or infection: Secondary | ICD-10-CM | POA: Diagnosis present

## 2023-07-20 DIAGNOSIS — R1010 Upper abdominal pain, unspecified: Secondary | ICD-10-CM | POA: Diagnosis present

## 2023-07-20 DIAGNOSIS — K5730 Diverticulosis of large intestine without perforation or abscess without bleeding: Secondary | ICD-10-CM | POA: Diagnosis present

## 2023-07-20 DIAGNOSIS — K8000 Calculus of gallbladder with acute cholecystitis without obstruction: Secondary | ICD-10-CM | POA: Insufficient documentation

## 2023-07-20 DIAGNOSIS — I10 Essential (primary) hypertension: Secondary | ICD-10-CM | POA: Diagnosis present

## 2023-07-20 DIAGNOSIS — E872 Acidosis, unspecified: Secondary | ICD-10-CM | POA: Diagnosis present

## 2023-07-20 DIAGNOSIS — R531 Weakness: Secondary | ICD-10-CM | POA: Diagnosis not present

## 2023-07-20 LAB — URINALYSIS, ROUTINE W REFLEX MICROSCOPIC
Bilirubin Urine: NEGATIVE
Glucose, UA: NEGATIVE mg/dL
Hgb urine dipstick: NEGATIVE
Ketones, ur: NEGATIVE mg/dL
Leukocytes,Ua: NEGATIVE
Nitrite: NEGATIVE
Protein, ur: 30 mg/dL — AB
Specific Gravity, Urine: 1.019 (ref 1.005–1.030)
pH: 6 (ref 5.0–8.0)

## 2023-07-20 LAB — COMPREHENSIVE METABOLIC PANEL WITH GFR
ALT: 285 U/L — ABNORMAL HIGH (ref 0–44)
ALT: 235 U/L — ABNORMAL HIGH (ref 0–44)
AST: 252 U/L — ABNORMAL HIGH (ref 15–41)
AST: 165 U/L — ABNORMAL HIGH (ref 15–41)
Albumin: 3.4 g/dL — ABNORMAL LOW (ref 3.5–5.0)
Albumin: 2.9 g/dL — ABNORMAL LOW (ref 3.5–5.0)
Alkaline Phosphatase: 223 U/L — ABNORMAL HIGH (ref 38–126)
Alkaline Phosphatase: 191 U/L — ABNORMAL HIGH (ref 38–126)
Anion gap: 14 (ref 5–15)
Anion gap: 10 (ref 5–15)
BUN: 12 mg/dL (ref 8–23)
BUN: 12 mg/dL (ref 8–23)
CO2: 17 mmol/L — ABNORMAL LOW (ref 22–32)
CO2: 22 mmol/L (ref 22–32)
Calcium: 8.5 mg/dL — ABNORMAL LOW (ref 8.9–10.3)
Calcium: 8.5 mg/dL — ABNORMAL LOW (ref 8.9–10.3)
Chloride: 103 mmol/L (ref 98–111)
Chloride: 105 mmol/L (ref 98–111)
Creatinine, Ser: 0.98 mg/dL (ref 0.61–1.24)
Creatinine, Ser: 1.16 mg/dL (ref 0.61–1.24)
GFR, Estimated: >60 mL/min (ref >60)
GFR, Estimated: >60 mL/min (ref >60)
Glucose, Bld: 129 mg/dL — ABNORMAL HIGH (ref 70–99)
Glucose, Bld: 111 mg/dL — ABNORMAL HIGH (ref 70–99)
Potassium: 3.4 mmol/L — ABNORMAL LOW (ref 3.5–5.1)
Potassium: 4.4 mmol/L (ref 3.5–5.1)
Sodium: 134 mmol/L — ABNORMAL LOW (ref 135–145)
Sodium: 137 mmol/L (ref 135–145)
Total Bilirubin: 6.6 mg/dL — ABNORMAL HIGH (ref 0.0–1.2)
Total Bilirubin: 6.3 mg/dL — ABNORMAL HIGH (ref 0.0–1.2)
Total Protein: 6.9 g/dL (ref 6.5–8.1)
Total Protein: 6.5 g/dL (ref 6.5–8.1)

## 2023-07-20 LAB — CBC WITH DIFFERENTIAL/PLATELET
Abs Immature Granulocytes: 0.22 10*3/uL — ABNORMAL HIGH (ref 0.00–0.07)
Abs Immature Granulocytes: 0 10*3/uL (ref 0.00–0.07)
Basophils Absolute: 0 10*3/uL (ref 0.0–0.1)
Basophils Absolute: 0 10*3/uL (ref 0.0–0.1)
Basophils Relative: 0 %
Basophils Relative: 0 %
Eosinophils Absolute: 0 10*3/uL (ref 0.0–0.5)
Eosinophils Absolute: 0 10*3/uL (ref 0.0–0.5)
Eosinophils Relative: 0 %
Eosinophils Relative: 0 %
HCT: 41.5 % (ref 39.0–52.0)
HCT: 36.3 % — ABNORMAL LOW (ref 39.0–52.0)
Hemoglobin: 13.8 g/dL (ref 13.0–17.0)
Hemoglobin: 12.1 g/dL — ABNORMAL LOW (ref 13.0–17.0)
Immature Granulocytes: 1 %
Lymphocytes Relative: 1 %
Lymphocytes Relative: 4 %
Lymphs Abs: 0.2 10*3/uL — ABNORMAL LOW (ref 0.7–4.0)
Lymphs Abs: 1.1 10*3/uL (ref 0.7–4.0)
MCH: 29.6 pg (ref 26.0–34.0)
MCH: 29.7 pg (ref 26.0–34.0)
MCHC: 33.3 g/dL (ref 30.0–36.0)
MCHC: 33.3 g/dL (ref 30.0–36.0)
MCV: 89.1 fL (ref 80.0–100.0)
MCV: 89.2 fL (ref 80.0–100.0)
Monocytes Absolute: 0.4 10*3/uL (ref 0.1–1.0)
Monocytes Absolute: 0.6 10*3/uL (ref 0.1–1.0)
Monocytes Relative: 2 %
Monocytes Relative: 2 %
Neutro Abs: 15.7 10*3/uL — ABNORMAL HIGH (ref 1.7–7.7)
Neutro Abs: 25.9 10*3/uL — ABNORMAL HIGH (ref 1.7–7.7)
Neutrophils Relative %: 96 %
Neutrophils Relative %: 94 %
Platelets: 185 10*3/uL (ref 150–400)
Platelets: 209 10*3/uL (ref 150–400)
RBC: 4.66 MIL/uL (ref 4.22–5.81)
RBC: 4.07 MIL/uL — ABNORMAL LOW (ref 4.22–5.81)
RDW: 25.5 % — ABNORMAL HIGH (ref 11.5–15.5)
RDW: 25.4 % — ABNORMAL HIGH (ref 11.5–15.5)
Smear Review: NORMAL
WBC: 16.5 10*3/uL — ABNORMAL HIGH (ref 4.0–10.5)
WBC: 27.5 10*3/uL — ABNORMAL HIGH (ref 4.0–10.5)
nRBC: 0 % (ref 0.0–0.2)
nRBC: 0 % (ref 0.0–0.2)
nRBC: 0 /100{WBCs}

## 2023-07-20 LAB — TROPONIN I (HIGH SENSITIVITY)
Troponin I (High Sensitivity): 19 ng/L — ABNORMAL HIGH (ref <18)
Troponin I (High Sensitivity): 19 ng/L — ABNORMAL HIGH (ref <18)

## 2023-07-20 LAB — MAGNESIUM: Magnesium: 1.6 mg/dL — ABNORMAL LOW (ref 1.7–2.4)

## 2023-07-20 LAB — LIPASE, BLOOD
Lipase: 40 U/L (ref 11–51)
Lipase: 32 U/L (ref 11–51)

## 2023-07-20 LAB — LACTIC ACID, PLASMA
Lactic Acid, Venous: 3.8 mmol/L (ref 0.5–1.9)
Lactic Acid, Venous: 4 mmol/L (ref 0.5–1.9)
Lactic Acid, Venous: 5.2 mmol/L (ref 0.5–1.9)
Lactic Acid, Venous: 3.1 mmol/L (ref 0.5–1.9)
Lactic Acid, Venous: 3 mmol/L (ref 0.5–1.9)

## 2023-07-20 LAB — BRAIN NATRIURETIC PEPTIDE: B Natriuretic Peptide: 180 pg/mL — ABNORMAL HIGH (ref 0.0–100.0)

## 2023-07-20 MED ORDER — PREDNISOLONE ACETATE 1 % OP SUSP
1.0000 [drp] | Freq: Every day | OPHTHALMIC | Status: DC
  Administered 2023-07-20 – 2023-07-24 (×4): 1 [drp] via OPHTHALMIC
  Filled 2023-07-20: qty 5

## 2023-07-20 MED ORDER — LACTATED RINGERS IV BOLUS
1000.0000 mL | Freq: Once | INTRAVENOUS | Status: AC
  Administered 2023-07-20: 1000 mL via INTRAVENOUS

## 2023-07-20 MED ORDER — TAMSULOSIN HCL 0.4 MG PO CAPS
0.4000 mg | ORAL_CAPSULE | Freq: Every day | ORAL | Status: DC
  Administered 2023-07-20 – 2023-07-26 (×5): 0.4 mg via ORAL
  Filled 2023-07-20 (×6): qty 1

## 2023-07-20 MED ORDER — PANTOPRAZOLE SODIUM 40 MG IV SOLR
40.0000 mg | INTRAVENOUS | Status: DC
  Administered 2023-07-20 – 2023-07-21 (×2): 40 mg via INTRAVENOUS
  Filled 2023-07-20 (×2): qty 10

## 2023-07-20 MED ORDER — GADOBUTROL 1 MMOL/ML IV SOLN
9.0000 mL | Freq: Once | INTRAVENOUS | Status: AC | PRN
  Administered 2023-07-20: 9 mL via INTRAVENOUS

## 2023-07-20 MED ORDER — SODIUM CHLORIDE 0.9 % IV SOLN: INTRAVENOUS | Status: DC

## 2023-07-20 MED ORDER — ALBUTEROL SULFATE (2.5 MG/3ML) 0.083% IN NEBU: 2.5000 mg | INHALATION_SOLUTION | Freq: Four times a day (QID) | RESPIRATORY_TRACT | Status: DC | PRN

## 2023-07-20 MED ORDER — METRONIDAZOLE 500 MG/100ML IV SOLN
500.0000 mg | Freq: Once | INTRAVENOUS | Status: AC
  Administered 2023-07-20: 500 mg via INTRAVENOUS
  Filled 2023-07-20: qty 100

## 2023-07-20 MED ORDER — SODIUM CHLORIDE 0.9 % IV SOLN
2.0000 g | Freq: Once | INTRAVENOUS | Status: AC
  Administered 2023-07-20: 2 g via INTRAVENOUS
  Filled 2023-07-20: qty 12.5

## 2023-07-20 MED ORDER — FINASTERIDE 5 MG PO TABS
5.0000 mg | ORAL_TABLET | Freq: Every day | ORAL | Status: DC
  Administered 2023-07-20 – 2023-07-26 (×5): 5 mg via ORAL
  Filled 2023-07-20 (×6): qty 1

## 2023-07-20 MED ORDER — ONDANSETRON HCL 4 MG/2ML IJ SOLN: 4.0000 mg | Freq: Four times a day (QID) | INTRAMUSCULAR | Status: DC | PRN

## 2023-07-20 MED ORDER — OXYCODONE HCL 5 MG PO TABS
5.0000 mg | ORAL_TABLET | Freq: Four times a day (QID) | ORAL | Status: DC | PRN
  Administered 2023-07-24 (×2): 5 mg via ORAL
  Filled 2023-07-20 (×3): qty 1

## 2023-07-20 MED ORDER — SODIUM CHLORIDE 0.9 % IV SOLN
2.0000 g | INTRAVENOUS | Status: DC
  Administered 2023-07-20 – 2023-07-24 (×4): 2 g via INTRAVENOUS
  Filled 2023-07-20 (×6): qty 20

## 2023-07-20 MED ORDER — METRONIDAZOLE 500 MG/100ML IV SOLN
500.0000 mg | Freq: Two times a day (BID) | INTRAVENOUS | Status: DC
  Administered 2023-07-20 – 2023-07-26 (×7): 500 mg via INTRAVENOUS
  Filled 2023-07-20 (×12): qty 100

## 2023-07-20 MED ORDER — FENTANYL CITRATE PF 50 MCG/ML IJ SOSY: 25.0000 ug | PREFILLED_SYRINGE | INTRAMUSCULAR | Status: DC | PRN

## 2023-07-20 MED ORDER — LACTATED RINGERS IV BOLUS: 1000.0000 mL | Freq: Once | INTRAVENOUS | Status: DC

## 2023-07-20 MED ORDER — PYRIDOSTIGMINE BROMIDE 60 MG PO TABS: 30.0000 mg | ORAL_TABLET | Freq: Three times a day (TID) | ORAL | Status: DC

## 2023-07-20 MED ORDER — ALBUTEROL SULFATE HFA 108 (90 BASE) MCG/ACT IN AERS: 2.0000 | INHALATION_SPRAY | Freq: Four times a day (QID) | RESPIRATORY_TRACT | Status: DC | PRN

## 2023-07-20 MED ORDER — ONDANSETRON HCL 4 MG PO TABS: 4.0000 mg | ORAL_TABLET | Freq: Four times a day (QID) | ORAL | Status: DC | PRN

## 2023-07-20 MED ORDER — PIPERACILLIN-TAZOBACTAM 3.375 G IVPB 30 MIN
3.3750 g | Freq: Once | INTRAVENOUS | Status: AC
  Administered 2023-07-20: 3.375 g via INTRAVENOUS
  Filled 2023-07-20: qty 50

## 2023-07-20 MED ORDER — PYRIDOSTIGMINE BROMIDE 60 MG PO TABS
60.0000 mg | ORAL_TABLET | Freq: Two times a day (BID) | ORAL | Status: DC
  Administered 2023-07-20 – 2023-07-26 (×8): 60 mg via ORAL
  Filled 2023-07-20 (×13): qty 1

## 2023-07-20 MED ORDER — DEXTROSE-SODIUM CHLORIDE 5-0.9 % IV SOLN: INTRAVENOUS | Status: AC

## 2023-07-20 MED ORDER — LACTATED RINGERS IV BOLUS
1871.0000 mL | Freq: Once | INTRAVENOUS | Status: AC
  Administered 2023-07-20: 1871 mL via INTRAVENOUS

## 2023-07-20 MED ORDER — POTASSIUM CHLORIDE IN NACL 20-0.9 MEQ/L-% IV SOLN
INTRAVENOUS | Status: DC
  Filled 2023-07-20: qty 1000

## 2023-07-20 MED ORDER — PANTOPRAZOLE SODIUM 40 MG IV SOLR
40.0000 mg | Freq: Once | INTRAVENOUS | Status: AC
  Administered 2023-07-20: 40 mg via INTRAVENOUS
  Filled 2023-07-20: qty 10

## 2023-07-20 NOTE — ED Provider Notes (Signed)
 Signout from Dr. Alejo Amsler.  See updated ED course below.  Transferred to [facility] due to possible need for ERCP.  Physical Exam  BP 119/89   Pulse 87   Temp 98 F (36.7 C) (Oral)   Resp (!) 22   SpO2 100%   Physical Exam  Procedures  Procedures  ED Course / MDM   Clinical Course as of 07/20/23 1619  Sat Jul 20, 2023  0705 S/o: 61M gen weakness x2 days Mild nausea here Limited historian  Labs w/ leukocytosis, new transaminitis  RUQ US  pending [MM]  0724 CT head interpreted by myself, no intracranial hemorrhage, formal radiology report pending [MM]  0727 Ecg = sinus tachycardia, rate 117, no ST elevation or depression, + right bundle branch block, no clear evidence of ischemia or arrhythmia on my interpretation [MM]  0727 Troponin mildly elevated, suspect type II NSTEMI in setting of underlying infection  Labs notable for leukocytosis as well as notably elevated bilirubin and associated transaminitis.  High concern for choledocholithiasis/cholecystitis/cholangitis.  Will add blood cultures and broad-spectrum antibiotics.  Currently on ultrasound. [MM]  0749 CTH: IMPRESSION: 1. No acute intracranial abnormality. 2. Mild generalized atrophy and periventricular white matter disease with some progression since the prior study.   [MM]  0822 US : IMPRESSION: Cholelithiasis without sonographic evidence of acute cholecystitis.   [MM]  1610 CXR: IMPRESSION: No acute cardiopulmonary abnormality.   [MM]  1230 GI paged to discuss [MM]  1234 D/w Dr. Baldomero Bone of GI - recs transfer given no availability of ERCP over weekend - broaden to zosyn [MM]  1243 Patient and family updated, amenable to transfer, would prefer [facility] if possible  ED clerk initiating transfer process [MM]  1306 D/w Dr. Nickey Barn of GI at [facility] - accepts for transfer, tentative plan for ERCP tomorrow - will be admitted to hospitalist service  Transfer center to call me back to d/w hospitalist [MM]  1341  Dr Brunilda Capra hospitalist accepts - accepted to [facility]  [MM]    Clinical Course User Index [MM] Collis Deaner, MD   Medical Decision Making Amount and/or Complexity of Data Reviewed Labs: ordered. Radiology: ordered.  Risk Prescription drug management.          Collis Deaner, MD 07/20/23 [phone]

## 2023-07-20 NOTE — Code Documentation (Signed)
 CODE SEPSIS - PHARMACY COMMUNICATION  **Broad Spectrum Antibiotics should be administered within 1 hour of Sepsis diagnosis**  Time Code Sepsis Called/Page Received: 0731  Antibiotics Ordered: cefepime, metronidazole  Time of 1st antibiotic administration: 0809  Additional action taken by pharmacy: none required  If necessary, Name of Provider/Nurse Contacted: N/A    Adalberto Acton ,PharmD Clinical Pharmacist  07/20/2023  7:32 AM

## 2023-07-20 NOTE — Plan of Care (Signed)

## 2023-07-20 NOTE — H&P (View-Only) (Signed)
 Reason for Consult: Biliary stricture, elevated liver enzymes, leukocytosis Referring Physician: [facility]  Arden Kotyk Sr. HPI: This is an 83 year old male with a PMH of mild colonic Crohn's disease (1997) on balslazide, HTN, COPD, hepatic flexure diverticular bleed s/p embolization 03/2023, and myasthenia gravis admitted for complaints of weakness.  His family reported that he had a progressive worsening of his weakness over the past couple of days as well as subjective high fevers and chills.  Further evaluation in the [facility] ER showed that he had elevated liver enzymes and an elevated WBC:  AST 252, ALT 285, AP 223, TB 6.6 and WBC 16.5.  He was not febrile in the ER and his blood pressure was overall stable.  Zosyn 3.375 g IV was provided for him a few hours ago.  Imaging with an MRCP showed an abrupt cut off superior to the pancreas in the CBD without clear evidence of choledocholithiasis.  An acute cholecystitis was found.  There was evidence of pancreatic ductal dilation in the setting of chronic pancreatitis.  Past Medical History:  Diagnosis Date   Arthritis    COPD (chronic obstructive pulmonary disease) (HCC)    Crohn's disease (HCC)    Emphysema, unspecified (HCC)    Hypertension    Myasthenia gravis (HCC)     Past Surgical History:  Procedure Laterality Date   COLONOSCOPY WITH PROPOFOL  N/A 03/20/2023   Procedure: COLONOSCOPY WITH PROPOFOL ;  Surgeon: Marnee Sink, MD;  Location: [facility] ENDOSCOPY;  Service: Endoscopy;  Laterality: N/A;   EMBOLIZATION (CATH LAB) N/A 03/21/2023   Procedure: EMBOLIZATION;  Surgeon: Jackquelyn Mass, MD;  Location: [facility] INVASIVE CV LAB;  Service: Cardiovascular;  Laterality: N/A;   ESOPHAGOGASTRODUODENOSCOPY N/A 03/19/2023   Procedure: ESOPHAGOGASTRODUODENOSCOPY (EGD);  Surgeon: Marnee Sink, MD;  Location: [facility] ENDOSCOPY;  Service: Endoscopy;  Laterality: N/A;   JOINT REPLACEMENT Left    total knee replacement    Dr. Aubry Blase   TONSILLECTOMY      TOTAL KNEE ARTHROPLASTY Right 11/20/2021   Procedure: RIGHT TOTAL KNEE ARTHROPLASTY;  Surgeon: Wendolyn Hamburger, MD;  Location: [facility] ORS;  Service: Orthopedics;  Laterality: Right;   WISDOM TOOTH EXTRACTION      Family History  Problem Relation Age of Onset   Brain cancer Father     Social History:  reports that he has quit smoking. His smoking use included cigarettes. He has never used smokeless tobacco. He reports that he does not currently use alcohol. He reports that he does not use drugs.  Allergies: No Known Allergies  Medications: Scheduled: Continuous:  Results for orders placed or performed during the hospital encounter of 07/20/23 (from the past 24 hours)  Brain natriuretic peptide     Status: Abnormal   Collection Time: 07/20/23  6:26 AM  Result Value Ref Range   B Natriuretic Peptide 180.0 (H) 0.0 - 100.0 pg/mL  Lactic acid, plasma     Status: Abnormal   Collection Time: 07/20/23  6:26 AM  Result Value Ref Range   Lactic Acid, Venous 3.8 (HH) 0.5 - 1.9 mmol/L  Comprehensive metabolic panel     Status: Abnormal   Collection Time: 07/20/23  6:29 AM  Result Value Ref Range   Sodium 134 (L) 135 - 145 mmol/L   Potassium 3.4 (L) 3.5 - 5.1 mmol/L   Chloride 103 98 - 111 mmol/L   CO2 17 (L) 22 - 32 mmol/L   Glucose, Bld 129 (H) 70 - 99 mg/dL   BUN 12 8 - 23  mg/dL   Creatinine, Ser 2.95 0.61 - 1.24 mg/dL   Calcium 8.5 (L) 8.9 - 10.3 mg/dL   Total Protein 6.9 6.5 - 8.1 g/dL   Albumin 3.4 (L) 3.5 - 5.0 g/dL   AST 621 (H) 15 - 41 U/L   ALT 285 (H) 0 - 44 U/L   Alkaline Phosphatase 223 (H) 38 - 126 U/L   Total Bilirubin 6.6 (H) 0.0 - 1.2 mg/dL   GFR, Estimated >30 >86 mL/min   Anion gap 14 5 - 15  Troponin I (High Sensitivity)     Status: Abnormal   Collection Time: 07/20/23  6:29 AM  Result Value Ref Range   Troponin I (High Sensitivity) 19 (H) <18 ng/L  CBC with Differential     Status: Abnormal   Collection Time: 07/20/23  6:29 AM  Result Value Ref Range   WBC 16.5  (H) 4.0 - 10.5 K/uL   RBC 4.66 4.22 - 5.81 MIL/uL   Hemoglobin 13.8 13.0 - 17.0 g/dL   HCT 57.8 46.9 - 62.9 %   MCV 89.1 80.0 - 100.0 fL   MCH 29.6 26.0 - 34.0 pg   MCHC 33.3 30.0 - 36.0 g/dL   RDW 52.8 (H) 41.3 - 24.4 %   Platelets 185 150 - 400 K/uL   nRBC 0.0 0.0 - 0.2 %   Neutrophils Relative % 96 %   Neutro Abs 15.7 (H) 1.7 - 7.7 K/uL   Lymphocytes Relative 1 %   Lymphs Abs 0.2 (L) 0.7 - 4.0 K/uL   Monocytes Relative 2 %   Monocytes Absolute 0.4 0.1 - 1.0 K/uL   Eosinophils Relative 0 %   Eosinophils Absolute 0.0 0.0 - 0.5 K/uL   Basophils Relative 0 %   Basophils Absolute 0.0 0.0 - 0.1 K/uL   WBC Morphology MORPHOLOGY UNREMARKABLE    RBC Morphology MIXED RBC POPULATION    Smear Review Normal platelet morphology    Immature Granulocytes 1 %   Abs Immature Granulocytes 0.22 (H) 0.00 - 0.07 K/uL  Troponin I (High Sensitivity)     Status: Abnormal   Collection Time: 07/20/23  8:05 AM  Result Value Ref Range   Troponin I (High Sensitivity) 19 (H) <18 ng/L  Lipase, blood     Status: None   Collection Time: 07/20/23  8:05 AM  Result Value Ref Range   Lipase 40 11 - 51 U/L  Lactic acid, plasma     Status: Abnormal   Collection Time: 07/20/23  9:07 AM  Result Value Ref Range   Lactic Acid, Venous 4.0 (HH) 0.5 - 1.9 mmol/L  Urinalysis, Routine w reflex microscopic -Urine, Clean Catch     Status: Abnormal   Collection Time: 07/20/23 12:51 PM  Result Value Ref Range   Color, Urine AMBER (A) YELLOW   APPearance CLEAR (A) CLEAR   Specific Gravity, Urine 1.019 1.005 - 1.030   pH 6.0 5.0 - 8.0   Glucose, UA NEGATIVE NEGATIVE mg/dL   Hgb urine dipstick NEGATIVE NEGATIVE   Bilirubin Urine NEGATIVE NEGATIVE   Ketones, ur NEGATIVE NEGATIVE mg/dL   Protein, ur 30 (A) NEGATIVE mg/dL   Nitrite NEGATIVE NEGATIVE   Leukocytes,Ua NEGATIVE NEGATIVE   RBC / HPF 0-5 0 - 5 RBC/hpf   WBC, UA 6-10 0 - 5 WBC/hpf   Bacteria, UA RARE (A) NONE SEEN   Squamous Epithelial / HPF 0-5 0 - 5 /HPF    Mucus PRESENT   Lactic acid, plasma     Status:  Abnormal   Collection Time: 07/20/23 12:59 PM  Result Value Ref Range   Lactic Acid, Venous 5.2 (HH) 0.5 - 1.9 mmol/L     MR ABDOMEN MRCP W WO CONTAST Result Date: 07/20/2023 CLINICAL DATA:  Sepsis.  Elevated bilirubin and transaminitis. EXAM: MRI ABDOMEN WITHOUT AND WITH CONTRAST (INCLUDING MRCP) TECHNIQUE: Multiplanar multisequence MR imaging of the abdomen was performed both before and after the administration of intravenous contrast. Heavily T2-weighted images of the biliary and pancreatic ducts were obtained, and three-dimensional MRCP images were rendered by post processing. CONTRAST:  9mL GADAVIST  GADOBUTROL  1 MMOL/ML IV SOLN COMPARISON:  12/12/2020 FINDINGS: Lower chest: No acute findings. Hepatobiliary: No hepatic masses identified. Tiny gallstones and gallbladder sludge noted. Gallbladder is distended and shows mild diffuse wall thickening, consistent with acute cholecystitis. Mild diffuse biliary ductal dilatation is seen with common bile duct measuring up to 7 mm. Abrupt termination of mid common bile duct is seen just superior to the pancreatic head, suspicious for stricture. No definite choledocholithiasis. Pancreas: No evidence of pancreatic mass or acute pancreatitis. Mild diffuse pancreatic ductal dilatation is seen with ductal irregularity, suspicious for chronic pancreatitis. Spleen:  Within normal limits in size and appearance. Adrenals/Urinary Tract: No suspicious masses identified. No evidence of hydronephrosis. Stomach/Bowel: Large hiatal hernia is seen. Severe colonic diverticulosis, without signs of diverticulitis involving the visualized portions. Vascular/Lymphatic: No pathologically enlarged lymph nodes identified. 3.1 cm infrarenal abdominal aortic aneurysm. Other:  None. Musculoskeletal:  No suspicious bone lesions identified. IMPRESSION: Findings consistent with acute cholecystitis. Mild diffuse biliary ductal dilatation, with  abrupt termination of the mid common bile duct just superior to the pancreatic head, suspicious for stricture. No definite choledocholithiasis. Mild diffuse pancreatic ductal dilatation with ductal irregularity, suspicious for chronic pancreatitis. No No evidence of pancreatic mass. Large hiatal hernia. Severe colonic diverticulosis. 3.1 cm infrarenal abdominal aortic aneurysm. Recommend follow-up ultrasound every 3 years. (Ref.: J Vasc Surg. 2018; 67:2-77 and J Am Coll Radiol 2013;10(10):789-794.) Electronically Signed   By: Marlyce Sine M.D.   On: 07/20/2023 11:56   MR 3D Recon At Scanner Result Date: 07/20/2023 CLINICAL DATA:  Sepsis.  Elevated bilirubin and transaminitis. EXAM: MRI ABDOMEN WITHOUT AND WITH CONTRAST (INCLUDING MRCP) TECHNIQUE: Multiplanar multisequence MR imaging of the abdomen was performed both before and after the administration of intravenous contrast. Heavily T2-weighted images of the biliary and pancreatic ducts were obtained, and three-dimensional MRCP images were rendered by post processing. CONTRAST:  9mL GADAVIST  GADOBUTROL  1 MMOL/ML IV SOLN COMPARISON:  12/12/2020 FINDINGS: Lower chest: No acute findings. Hepatobiliary: No hepatic masses identified. Tiny gallstones and gallbladder sludge noted. Gallbladder is distended and shows mild diffuse wall thickening, consistent with acute cholecystitis. Mild diffuse biliary ductal dilatation is seen with common bile duct measuring up to 7 mm. Abrupt termination of mid common bile duct is seen just superior to the pancreatic head, suspicious for stricture. No definite choledocholithiasis. Pancreas: No evidence of pancreatic mass or acute pancreatitis. Mild diffuse pancreatic ductal dilatation is seen with ductal irregularity, suspicious for chronic pancreatitis. Spleen:  Within normal limits in size and appearance. Adrenals/Urinary Tract: No suspicious masses identified. No evidence of hydronephrosis. Stomach/Bowel: Large hiatal hernia is  seen. Severe colonic diverticulosis, without signs of diverticulitis involving the visualized portions. Vascular/Lymphatic: No pathologically enlarged lymph nodes identified. 3.1 cm infrarenal abdominal aortic aneurysm. Other:  None. Musculoskeletal:  No suspicious bone lesions identified. IMPRESSION: Findings consistent with acute cholecystitis. Mild diffuse biliary ductal dilatation, with abrupt termination of the mid common bile duct just  superior to the pancreatic head, suspicious for stricture. No definite choledocholithiasis. Mild diffuse pancreatic ductal dilatation with ductal irregularity, suspicious for chronic pancreatitis. No No evidence of pancreatic mass. Large hiatal hernia. Severe colonic diverticulosis. 3.1 cm infrarenal abdominal aortic aneurysm. Recommend follow-up ultrasound every 3 years. (Ref.: J Vasc Surg. 2018; 67:2-77 and J Am Coll Radiol 2013;10(10):789-794.) Electronically Signed   By: Marlyce Sine M.D.   On: 07/20/2023 11:56   US  ABDOMEN LIMITED RUQ (LIVER/GB) Result Date: 07/20/2023 CLINICAL DATA:  [id number] Transaminitis [id number] EXAM: ULTRASOUND ABDOMEN LIMITED RIGHT UPPER QUADRANT COMPARISON:  March 19, 2023, February 14, 2023 FINDINGS: Gallbladder: Multiple cholelithiasis and layering sludge. No wall thickening visualized when remeasured; wall thickness appears the same compared to prior ultrasound. No sonographic Murphy sign noted by sonographer. Common bile duct: Diameter: Visualized portion measures 4 mm, within normal limits. Liver: No focal lesion identified. Within normal limits in parenchymal echogenicity. Portal vein is patent on color Doppler imaging with normal direction of blood flow towards the liver. Other: None. IMPRESSION: Cholelithiasis without sonographic evidence of acute cholecystitis. Electronically Signed   By: Clancy Crimes M.D.   On: 07/20/2023 08:18   DG Chest Portable 1 View Result Date: 07/20/2023 CLINICAL DATA:  weakness, leukocytosis, eval for pna  EXAM: PORTABLE CHEST 1 VIEW COMPARISON:  November 07, 2021, December 28, 2017 FINDINGS: Evaluation is limited by rotation. The cardiomediastinal silhouette is unchanged in contour. No pleural effusion. No pneumothorax. Rounded LEFT retrocardiac opacity consistent with a hiatal hernia. Similar biapical scarring with bullous changes. No acute pleuroparenchymal abnormality. IMPRESSION: No acute cardiopulmonary abnormality. Electronically Signed   By: Clancy Crimes M.D.   On: 07/20/2023 08:16   CT Head Wo Contrast Result Date: 07/20/2023 EXAM: CT HEAD WITHOUT CONTRAST 07/20/2023 07:17:00 AM TECHNIQUE: CT of the head was performed without the administration of intravenous contrast. Automated exposure control, iterative reconstruction, and/or weight based adjustment of the mA/kV was utilized to reduce the radiation dose to as low as reasonably achievable. COMPARISON: CT head without contrast and MR head without contrast 12/28/2017. CLINICAL HISTORY: Mental status change, unknown cause. BIB EMS for lethargy and weakness. Upon arrival, patient complains of nausea. Patient was 90% on room air on their arrival to the home. Patient on [facility] on arrival. Not normally on O2. FINDINGS: BRAIN AND VENTRICLES: There is no acute intracranial hemorrhage, mass effect or midline shift. No abnormal extra-axial fluid collection. The gray-white differentiation is maintained without an acute infarct. There is no hydrocephalus. Mild generalized atrophy and periventricular white matter disease demonstrate some progression since the prior study. ORBITS: Bilateral lens replacements are noted. The globes and orbits are otherwise within normal limits. SINUSES: The visualized paranasal sinuses and mastoid air cells demonstrate no acute abnormality. SOFT TISSUES AND SKULL: No acute abnormality of the visualized skull or soft tissues. IMPRESSION: 1. No acute intracranial abnormality. 2. Mild generalized atrophy and periventricular white matter  disease with some progression since the prior study. Electronically signed by: Audree Leas MD 07/20/2023 07:29 AM EDT RP Workstation: [id number]    ROS:  As stated above in the HPI otherwise negative.  Blood pressure (!) 144/96, pulse 79, temperature (!) 97.4 F (36.3 C), temperature source Axillary, resp. rate (!) 27, SpO2 90%.    PE: Gen: NAD, Alert and Oriented HEENT:  [place]/AT, EOMI Neck: Supple, no LAD Lungs: CTA Bilaterally CV: RRR without M/G/R ABD: Soft, NTND, +BS Ext: No C/C/E  Assessment/Plan: 1) Biliary obstruction - ? Secondary to chronic pancreatitis.  His chronic pancreatitis can be  an extra-intestinal manifestation of Crohn's disease. 2) Acute cholecystitis. 3) Elevated liver enzymes. 4) Mild Crohn's colitis. 5) Myasthenia gravis.   Clinically he is stable and he reports feeling much better.  The physical examination was benign.  The findings of the abrupt cut off in the CBD without a pancreatic mass requires further evaluation with an ERCP.  No evidence of a stone was identified.  If there is a stricture, brushings and a stent will be inserted.  Plan: 1) ERCP tomorrow. 2) Surgical consultation. 3) Zosyn 3.375 g IV q8 hours.  Elaura Calix D 07/20/2023, 4:40 PM

## 2023-07-20 NOTE — ED Notes (Signed)
 Pt taken to MRI

## 2023-07-20 NOTE — Consult Note (Signed)
 Reason for Consult: Biliary stricture, elevated liver enzymes, leukocytosis Referring Physician: [facility]  Arden Kotyk Sr. HPI: This is an 83 year old male with a PMH of mild colonic Crohn's disease (1997) on balslazide, HTN, COPD, hepatic flexure diverticular bleed s/p embolization 03/2023, and myasthenia gravis admitted for complaints of weakness.  His family reported that he had a progressive worsening of his weakness over the past couple of days as well as subjective high fevers and chills.  Further evaluation in the [facility] ER showed that he had elevated liver enzymes and an elevated WBC:  AST 252, ALT 285, AP 223, TB 6.6 and WBC 16.5.  He was not febrile in the ER and his blood pressure was overall stable.  Zosyn 3.375 g IV was provided for him a few hours ago.  Imaging with an MRCP showed an abrupt cut off superior to the pancreas in the CBD without clear evidence of choledocholithiasis.  An acute cholecystitis was found.  There was evidence of pancreatic ductal dilation in the setting of chronic pancreatitis.  Past Medical History:  Diagnosis Date   Arthritis    COPD (chronic obstructive pulmonary disease) (HCC)    Crohn's disease (HCC)    Emphysema, unspecified (HCC)    Hypertension    Myasthenia gravis (HCC)     Past Surgical History:  Procedure Laterality Date   COLONOSCOPY WITH PROPOFOL  N/A 03/20/2023   Procedure: COLONOSCOPY WITH PROPOFOL ;  Surgeon: Marnee Sink, MD;  Location: [facility] ENDOSCOPY;  Service: Endoscopy;  Laterality: N/A;   EMBOLIZATION (CATH LAB) N/A 03/21/2023   Procedure: EMBOLIZATION;  Surgeon: Jackquelyn Mass, MD;  Location: [facility] INVASIVE CV LAB;  Service: Cardiovascular;  Laterality: N/A;   ESOPHAGOGASTRODUODENOSCOPY N/A 03/19/2023   Procedure: ESOPHAGOGASTRODUODENOSCOPY (EGD);  Surgeon: Marnee Sink, MD;  Location: [facility] ENDOSCOPY;  Service: Endoscopy;  Laterality: N/A;   JOINT REPLACEMENT Left    total knee replacement    Dr. Aubry Blase   TONSILLECTOMY      TOTAL KNEE ARTHROPLASTY Right 11/20/2021   Procedure: RIGHT TOTAL KNEE ARTHROPLASTY;  Surgeon: Wendolyn Hamburger, MD;  Location: [facility] ORS;  Service: Orthopedics;  Laterality: Right;   WISDOM TOOTH EXTRACTION      Family History  Problem Relation Age of Onset   Brain cancer Father     Social History:  reports that he has quit smoking. His smoking use included cigarettes. He has never used smokeless tobacco. He reports that he does not currently use alcohol. He reports that he does not use drugs.  Allergies: No Known Allergies  Medications: Scheduled: Continuous:  Results for orders placed or performed during the hospital encounter of 07/20/23 (from the past 24 hours)  Brain natriuretic peptide     Status: Abnormal   Collection Time: 07/20/23  6:26 AM  Result Value Ref Range   B Natriuretic Peptide 180.0 (H) 0.0 - 100.0 pg/mL  Lactic acid, plasma     Status: Abnormal   Collection Time: 07/20/23  6:26 AM  Result Value Ref Range   Lactic Acid, Venous 3.8 (HH) 0.5 - 1.9 mmol/L  Comprehensive metabolic panel     Status: Abnormal   Collection Time: 07/20/23  6:29 AM  Result Value Ref Range   Sodium 134 (L) 135 - 145 mmol/L   Potassium 3.4 (L) 3.5 - 5.1 mmol/L   Chloride 103 98 - 111 mmol/L   CO2 17 (L) 22 - 32 mmol/L   Glucose, Bld 129 (H) 70 - 99 mg/dL   BUN 12 8 - 23  mg/dL   Creatinine, Ser 2.95 0.61 - 1.24 mg/dL   Calcium 8.5 (L) 8.9 - 10.3 mg/dL   Total Protein 6.9 6.5 - 8.1 g/dL   Albumin 3.4 (L) 3.5 - 5.0 g/dL   AST 621 (H) 15 - 41 U/L   ALT 285 (H) 0 - 44 U/L   Alkaline Phosphatase 223 (H) 38 - 126 U/L   Total Bilirubin 6.6 (H) 0.0 - 1.2 mg/dL   GFR, Estimated >30 >86 mL/min   Anion gap 14 5 - 15  Troponin I (High Sensitivity)     Status: Abnormal   Collection Time: 07/20/23  6:29 AM  Result Value Ref Range   Troponin I (High Sensitivity) 19 (H) <18 ng/L  CBC with Differential     Status: Abnormal   Collection Time: 07/20/23  6:29 AM  Result Value Ref Range   WBC 16.5  (H) 4.0 - 10.5 K/uL   RBC 4.66 4.22 - 5.81 MIL/uL   Hemoglobin 13.8 13.0 - 17.0 g/dL   HCT 57.8 46.9 - 62.9 %   MCV 89.1 80.0 - 100.0 fL   MCH 29.6 26.0 - 34.0 pg   MCHC 33.3 30.0 - 36.0 g/dL   RDW 52.8 (H) 41.3 - 24.4 %   Platelets 185 150 - 400 K/uL   nRBC 0.0 0.0 - 0.2 %   Neutrophils Relative % 96 %   Neutro Abs 15.7 (H) 1.7 - 7.7 K/uL   Lymphocytes Relative 1 %   Lymphs Abs 0.2 (L) 0.7 - 4.0 K/uL   Monocytes Relative 2 %   Monocytes Absolute 0.4 0.1 - 1.0 K/uL   Eosinophils Relative 0 %   Eosinophils Absolute 0.0 0.0 - 0.5 K/uL   Basophils Relative 0 %   Basophils Absolute 0.0 0.0 - 0.1 K/uL   WBC Morphology MORPHOLOGY UNREMARKABLE    RBC Morphology MIXED RBC POPULATION    Smear Review Normal platelet morphology    Immature Granulocytes 1 %   Abs Immature Granulocytes 0.22 (H) 0.00 - 0.07 K/uL  Troponin I (High Sensitivity)     Status: Abnormal   Collection Time: 07/20/23  8:05 AM  Result Value Ref Range   Troponin I (High Sensitivity) 19 (H) <18 ng/L  Lipase, blood     Status: None   Collection Time: 07/20/23  8:05 AM  Result Value Ref Range   Lipase 40 11 - 51 U/L  Lactic acid, plasma     Status: Abnormal   Collection Time: 07/20/23  9:07 AM  Result Value Ref Range   Lactic Acid, Venous 4.0 (HH) 0.5 - 1.9 mmol/L  Urinalysis, Routine w reflex microscopic -Urine, Clean Catch     Status: Abnormal   Collection Time: 07/20/23 12:51 PM  Result Value Ref Range   Color, Urine AMBER (A) YELLOW   APPearance CLEAR (A) CLEAR   Specific Gravity, Urine 1.019 1.005 - 1.030   pH 6.0 5.0 - 8.0   Glucose, UA NEGATIVE NEGATIVE mg/dL   Hgb urine dipstick NEGATIVE NEGATIVE   Bilirubin Urine NEGATIVE NEGATIVE   Ketones, ur NEGATIVE NEGATIVE mg/dL   Protein, ur 30 (A) NEGATIVE mg/dL   Nitrite NEGATIVE NEGATIVE   Leukocytes,Ua NEGATIVE NEGATIVE   RBC / HPF 0-5 0 - 5 RBC/hpf   WBC, UA 6-10 0 - 5 WBC/hpf   Bacteria, UA RARE (A) NONE SEEN   Squamous Epithelial / HPF 0-5 0 - 5 /HPF    Mucus PRESENT   Lactic acid, plasma     Status:  Abnormal   Collection Time: 07/20/23 12:59 PM  Result Value Ref Range   Lactic Acid, Venous 5.2 (HH) 0.5 - 1.9 mmol/L     MR ABDOMEN MRCP W WO CONTAST Result Date: 07/20/2023 CLINICAL DATA:  Sepsis.  Elevated bilirubin and transaminitis. EXAM: MRI ABDOMEN WITHOUT AND WITH CONTRAST (INCLUDING MRCP) TECHNIQUE: Multiplanar multisequence MR imaging of the abdomen was performed both before and after the administration of intravenous contrast. Heavily T2-weighted images of the biliary and pancreatic ducts were obtained, and three-dimensional MRCP images were rendered by post processing. CONTRAST:  9mL GADAVIST  GADOBUTROL  1 MMOL/ML IV SOLN COMPARISON:  12/12/2020 FINDINGS: Lower chest: No acute findings. Hepatobiliary: No hepatic masses identified. Tiny gallstones and gallbladder sludge noted. Gallbladder is distended and shows mild diffuse wall thickening, consistent with acute cholecystitis. Mild diffuse biliary ductal dilatation is seen with common bile duct measuring up to 7 mm. Abrupt termination of mid common bile duct is seen just superior to the pancreatic head, suspicious for stricture. No definite choledocholithiasis. Pancreas: No evidence of pancreatic mass or acute pancreatitis. Mild diffuse pancreatic ductal dilatation is seen with ductal irregularity, suspicious for chronic pancreatitis. Spleen:  Within normal limits in size and appearance. Adrenals/Urinary Tract: No suspicious masses identified. No evidence of hydronephrosis. Stomach/Bowel: Large hiatal hernia is seen. Severe colonic diverticulosis, without signs of diverticulitis involving the visualized portions. Vascular/Lymphatic: No pathologically enlarged lymph nodes identified. 3.1 cm infrarenal abdominal aortic aneurysm. Other:  None. Musculoskeletal:  No suspicious bone lesions identified. IMPRESSION: Findings consistent with acute cholecystitis. Mild diffuse biliary ductal dilatation, with  abrupt termination of the mid common bile duct just superior to the pancreatic head, suspicious for stricture. No definite choledocholithiasis. Mild diffuse pancreatic ductal dilatation with ductal irregularity, suspicious for chronic pancreatitis. No No evidence of pancreatic mass. Large hiatal hernia. Severe colonic diverticulosis. 3.1 cm infrarenal abdominal aortic aneurysm. Recommend follow-up ultrasound every 3 years. (Ref.: J Vasc Surg. 2018; 67:2-77 and J Am Coll Radiol 2013;10(10):789-794.) Electronically Signed   By: Marlyce Sine M.D.   On: 07/20/2023 11:56   MR 3D Recon At Scanner Result Date: 07/20/2023 CLINICAL DATA:  Sepsis.  Elevated bilirubin and transaminitis. EXAM: MRI ABDOMEN WITHOUT AND WITH CONTRAST (INCLUDING MRCP) TECHNIQUE: Multiplanar multisequence MR imaging of the abdomen was performed both before and after the administration of intravenous contrast. Heavily T2-weighted images of the biliary and pancreatic ducts were obtained, and three-dimensional MRCP images were rendered by post processing. CONTRAST:  9mL GADAVIST  GADOBUTROL  1 MMOL/ML IV SOLN COMPARISON:  12/12/2020 FINDINGS: Lower chest: No acute findings. Hepatobiliary: No hepatic masses identified. Tiny gallstones and gallbladder sludge noted. Gallbladder is distended and shows mild diffuse wall thickening, consistent with acute cholecystitis. Mild diffuse biliary ductal dilatation is seen with common bile duct measuring up to 7 mm. Abrupt termination of mid common bile duct is seen just superior to the pancreatic head, suspicious for stricture. No definite choledocholithiasis. Pancreas: No evidence of pancreatic mass or acute pancreatitis. Mild diffuse pancreatic ductal dilatation is seen with ductal irregularity, suspicious for chronic pancreatitis. Spleen:  Within normal limits in size and appearance. Adrenals/Urinary Tract: No suspicious masses identified. No evidence of hydronephrosis. Stomach/Bowel: Large hiatal hernia is  seen. Severe colonic diverticulosis, without signs of diverticulitis involving the visualized portions. Vascular/Lymphatic: No pathologically enlarged lymph nodes identified. 3.1 cm infrarenal abdominal aortic aneurysm. Other:  None. Musculoskeletal:  No suspicious bone lesions identified. IMPRESSION: Findings consistent with acute cholecystitis. Mild diffuse biliary ductal dilatation, with abrupt termination of the mid common bile duct just  superior to the pancreatic head, suspicious for stricture. No definite choledocholithiasis. Mild diffuse pancreatic ductal dilatation with ductal irregularity, suspicious for chronic pancreatitis. No No evidence of pancreatic mass. Large hiatal hernia. Severe colonic diverticulosis. 3.1 cm infrarenal abdominal aortic aneurysm. Recommend follow-up ultrasound every 3 years. (Ref.: J Vasc Surg. 2018; 67:2-77 and J Am Coll Radiol 2013;10(10):789-794.) Electronically Signed   By: Marlyce Sine M.D.   On: 07/20/2023 11:56   US  ABDOMEN LIMITED RUQ (LIVER/GB) Result Date: 07/20/2023 CLINICAL DATA:  [id number] Transaminitis [id number] EXAM: ULTRASOUND ABDOMEN LIMITED RIGHT UPPER QUADRANT COMPARISON:  March 19, 2023, February 14, 2023 FINDINGS: Gallbladder: Multiple cholelithiasis and layering sludge. No wall thickening visualized when remeasured; wall thickness appears the same compared to prior ultrasound. No sonographic Murphy sign noted by sonographer. Common bile duct: Diameter: Visualized portion measures 4 mm, within normal limits. Liver: No focal lesion identified. Within normal limits in parenchymal echogenicity. Portal vein is patent on color Doppler imaging with normal direction of blood flow towards the liver. Other: None. IMPRESSION: Cholelithiasis without sonographic evidence of acute cholecystitis. Electronically Signed   By: Clancy Crimes M.D.   On: 07/20/2023 08:18   DG Chest Portable 1 View Result Date: 07/20/2023 CLINICAL DATA:  weakness, leukocytosis, eval for pna  EXAM: PORTABLE CHEST 1 VIEW COMPARISON:  November 07, 2021, December 28, 2017 FINDINGS: Evaluation is limited by rotation. The cardiomediastinal silhouette is unchanged in contour. No pleural effusion. No pneumothorax. Rounded LEFT retrocardiac opacity consistent with a hiatal hernia. Similar biapical scarring with bullous changes. No acute pleuroparenchymal abnormality. IMPRESSION: No acute cardiopulmonary abnormality. Electronically Signed   By: Clancy Crimes M.D.   On: 07/20/2023 08:16   CT Head Wo Contrast Result Date: 07/20/2023 EXAM: CT HEAD WITHOUT CONTRAST 07/20/2023 07:17:00 AM TECHNIQUE: CT of the head was performed without the administration of intravenous contrast. Automated exposure control, iterative reconstruction, and/or weight based adjustment of the mA/kV was utilized to reduce the radiation dose to as low as reasonably achievable. COMPARISON: CT head without contrast and MR head without contrast 12/28/2017. CLINICAL HISTORY: Mental status change, unknown cause. BIB EMS for lethargy and weakness. Upon arrival, patient complains of nausea. Patient was 90% on room air on their arrival to the home. Patient on [facility] on arrival. Not normally on O2. FINDINGS: BRAIN AND VENTRICLES: There is no acute intracranial hemorrhage, mass effect or midline shift. No abnormal extra-axial fluid collection. The gray-white differentiation is maintained without an acute infarct. There is no hydrocephalus. Mild generalized atrophy and periventricular white matter disease demonstrate some progression since the prior study. ORBITS: Bilateral lens replacements are noted. The globes and orbits are otherwise within normal limits. SINUSES: The visualized paranasal sinuses and mastoid air cells demonstrate no acute abnormality. SOFT TISSUES AND SKULL: No acute abnormality of the visualized skull or soft tissues. IMPRESSION: 1. No acute intracranial abnormality. 2. Mild generalized atrophy and periventricular white matter  disease with some progression since the prior study. Electronically signed by: Audree Leas MD 07/20/2023 07:29 AM EDT RP Workstation: [id number]    ROS:  As stated above in the HPI otherwise negative.  Blood pressure (!) 144/96, pulse 79, temperature (!) 97.4 F (36.3 C), temperature source Axillary, resp. rate (!) 27, SpO2 90%.    PE: Gen: NAD, Alert and Oriented HEENT:  [place]/AT, EOMI Neck: Supple, no LAD Lungs: CTA Bilaterally CV: RRR without M/G/R ABD: Soft, NTND, +BS Ext: No C/C/E  Assessment/Plan: 1) Biliary obstruction - ? Secondary to chronic pancreatitis.  His chronic pancreatitis can be  an extra-intestinal manifestation of Crohn's disease. 2) Acute cholecystitis. 3) Elevated liver enzymes. 4) Mild Crohn's colitis. 5) Myasthenia gravis.   Clinically he is stable and he reports feeling much better.  The physical examination was benign.  The findings of the abrupt cut off in the CBD without a pancreatic mass requires further evaluation with an ERCP.  No evidence of a stone was identified.  If there is a stricture, brushings and a stent will be inserted.  Plan: 1) ERCP tomorrow. 2) Surgical consultation. 3) Zosyn 3.375 g IV q8 hours.  Elaura Calix D 07/20/2023, 4:40 PM

## 2023-07-20 NOTE — ED Notes (Signed)
 Pt returned to bed with help from family. Reconnected to monitor.

## 2023-07-20 NOTE — ED Notes (Signed)
Transfer consent signed on paper

## 2023-07-20 NOTE — ED Notes (Signed)
 Pt stating need to use restroom. Pt assisted to toilet, pt unsteady on feet requires one person assist

## 2023-07-20 NOTE — ED Notes (Signed)
Repositioned patient in bed.

## 2023-07-20 NOTE — H&P (Signed)
 History and Physical    Kyle Holloway ZOX:[id number] DOB: July 15, 1940 DOA: 07/20/2023  PCP: Nestor Banter, MD Patient coming from: Home through [facility]  Chief Complaint: Abdominal pain  HPI: Kyle Kotyk Sr. with PMH of Crohn's disease, myasthenia gravis, esophageal stricture, diaphragmatic hernia, GERD, COPD, BPH and hard of hearing presented to [facility] ED with lethargy, weakness, upper abdominal pain, subjective fever, nausea and spitting up.  Patient reports onset of symptoms about 10:30 PM last night.  Describes the abdominal pain as intermittent squeezing.  Rates his pain mild.  Denies radiation to his back or his shoulders.  Denies hematemesis, melena or hematochezia.  Denies UTI symptoms or focal neurosymptoms.  Last took his home medications yesterday evening.  Patient lives with his wife.  Independently ambulates at baseline.  Quit smoking about 25 years ago.  Denies drinking alcohol or recreational drug use.  Interested in cardiopulmonary cessation but not interested in life prolongation on life support.  At [facility] ED, tachycardic to 117 and tachypneic to 22.  Afebrile.  WBC 16.5 with left shift.  Na 134.  K3.4.  Bicarb 17.  AG 14.  AST 252.  ALT 285.  Total bili 6.6.  ALP 223.  Lactic acid 3.8, 4.0 and 5.2.  RUQ US  concerning for gallstone.  MRCP showed cholelithiasis, acute cholecystitis, mild diffuse biliary ductal dilation with abrupt termination of the mid common bile duct just superior to pancreatic head suspicious for stricture and mild diffuse pancreatic ductal dilation with ductal irregularity suspicious for chronic pancreatitis, large hiatal hernia, diverticulosis and 3.1 cm infrarenal AAA.  Blood cultures obtained.  Received IV fluid and started on IV Zosyn, and he was transferred to [facility] for ERCP since there is no availability of ERCP over the weekend at [facility].  On arrival here, tachypneic to 27 and slightly hypertensive to 144/98.  Evaluated by GI.   ROS All  review of system negative except for pertinent positives and negatives as history of present illness above.  PMH Past Medical History:  Diagnosis Date   Arthritis    COPD (chronic obstructive pulmonary disease) (HCC)    Crohn's disease (HCC)    Emphysema, unspecified (HCC)    Hypertension    Myasthenia gravis (HCC)    PSH Past Surgical History:  Procedure Laterality Date   COLONOSCOPY WITH PROPOFOL  N/A 03/20/2023   Procedure: COLONOSCOPY WITH PROPOFOL ;  Surgeon: Marnee Sink, MD;  Location: [facility] ENDOSCOPY;  Service: Endoscopy;  Laterality: N/A;   EMBOLIZATION (CATH LAB) N/A 03/21/2023   Procedure: EMBOLIZATION;  Surgeon: Jackquelyn Mass, MD;  Location: [facility] INVASIVE CV LAB;  Service: Cardiovascular;  Laterality: N/A;   ESOPHAGOGASTRODUODENOSCOPY N/A 03/19/2023   Procedure: ESOPHAGOGASTRODUODENOSCOPY (EGD);  Surgeon: Marnee Sink, MD;  Location: [facility] ENDOSCOPY;  Service: Endoscopy;  Laterality: N/A;   JOINT REPLACEMENT Left    total knee replacement    Dr. Aubry Blase   TONSILLECTOMY     TOTAL KNEE ARTHROPLASTY Right 11/20/2021   Procedure: RIGHT TOTAL KNEE ARTHROPLASTY;  Surgeon: Wendolyn Hamburger, MD;  Location: [facility] ORS;  Service: Orthopedics;  Laterality: Right;   WISDOM TOOTH EXTRACTION     Fam HX Family History  Problem Relation Age of Onset   Brain cancer Father     Social Hx  reports that he has quit smoking. His smoking use included cigarettes. He has never used smokeless tobacco. He reports that he does not currently use alcohol. He reports that he does not use drugs.  Allergy No Known Allergies Home Meds Prior  to Admission medications   Medication Sig Start Date End Date Taking? Authorizing Provider  acetaminophen  (TYLENOL ) 325 MG tablet Take 650 mg by mouth 3 (three) times daily as needed (pain).    [provider]  albuterol  (PROVENTIL  HFA;VENTOLIN  HFA) 108 (90 Base) MCG/ACT inhaler Inhale 2 puffs into the lungs every 6 (six) hours as needed for wheezing or  shortness of breath. 12/29/17   Salary, Delon Ferris D, MD  aspirin  EC 81 MG tablet Take 81 mg by mouth daily.     [provider]  atenolol (TENORMIN) 25 MG tablet Take 1 tablet by mouth daily. 04/19/22 04/19/23  [provider]  balsalazide (COLAZAL ) 750 MG capsule Take 750 mg by mouth 3 (three) times daily. 09/24/17   [provider]  Biotin 1 MG CAPS Take 1 mg by mouth daily.    [provider]  cholecalciferol (VITAMIN D3) 25 MCG (1000 UNIT) tablet Take 2,000 Units by mouth daily.    [provider]  finasteride  (PROSCAR ) 5 MG tablet Take 5 mg by mouth daily. 12/12/17   [provider]  folic acid  (FOLVITE ) 1 MG tablet Take 1 mg by mouth daily. 07/06/14   [provider]  methotrexate  (RHEUMATREX) 2.5 MG tablet Take 2 tablets (5 mg total) by mouth once a week. Every Wednesday  Caution:Chemotherapy. Protect from light. Patient taking differently: Take 10 mg by mouth once a week. On Wednesday. 12/29/17   Salary, Christene Covey, MD  Multiple Vitamins-Minerals (MULTIVITAMIN WITH MINERALS) tablet Take 1 tablet by mouth daily.    [provider]  Multiple Vitamins-Minerals (PRESERVISION AREDS PO) Take 2 tablets by mouth daily.    [provider]  Omega-3 Fatty Acids (FISH OIL) 1000 MG CAPS Take by mouth.    [provider]  omeprazole  (PRILOSEC) 40 MG capsule Take 1 capsule (40 mg total) by mouth 2 (two) times daily before a meal. 03/22/23 05/31/23  Brenna Cam, MD  prednisoLONE acetate (PRED FORTE) 1 % ophthalmic suspension Place 1 drop into the left eye 2 (two) times daily. 11/02/21   [provider]  pyridostigmine  (MESTINON ) 60 MG tablet Take 0.5 tablets (30 mg total) by mouth every 8 (eight) hours. Patient taking differently: Take 60 mg by mouth 3 (three) times daily. See Rx fill hx and office visit note 01/18/23 12/29/17   Salary, Delon Ferris D, MD  tamsulosin  (FLOMAX ) 0.4 MG CAPS capsule Take 0.4 mg by mouth daily.  12/12/17   [provider]  vitamin B-12 (CYANOCOBALAMIN ) 500 MCG tablet Take 1,000 mcg by mouth daily.    [provider]    Physical Exam: Vitals:   07/20/23 1615  BP: (!) 144/96  Pulse: 79  Resp: (!) 27  Temp: (!) 97.4 F (36.3 C)  TempSrc: Axillary  SpO2: 90%    GENERAL: No acute distress.  Appears well.  HEENT: MMM.  Vision and hearing grossly intact.  NECK: Supple.  No apparent JVD.  RESP:  No IWOB. Good air movement bilaterally. CVS:  RRR. Heart sounds normal.  ABD/GI/GU: Bowel sounds present. Soft. Non tender.  MSK/EXT:  Moves extremities. No apparent deformity or edema.  SKIN: no apparent skin lesion or wound NEURO: Awake, alert and oriented appropriately.  No gross deficit.  PSYCH: Calm. Normal affect.    Personally Reviewed Radiological Exams See HPI   Personally Reviewed Labs: CBC: Recent Labs  Lab 07/20/23 0629  WBC 16.5*  NEUTROABS 15.7*  HGB 13.8  HCT 41.5  MCV 89.1  PLT 185  Basic Metabolic Panel: Recent Labs  Lab 07/20/23 0629  NA 134*  K 3.4*  CL 103  CO2 17*  GLUCOSE 129*  BUN 12  CREATININE 0.98  CALCIUM 8.5*   GFR: CrCl cannot be calculated (Unknown ideal weight.). Liver Function Tests: Recent Labs  Lab 07/20/23 0629  AST 252*  ALT 285*  ALKPHOS 223*  BILITOT 6.6*  PROT 6.9  ALBUMIN 3.4*   Recent Labs  Lab 07/20/23 0805  LIPASE 40   No results for input(s): AMMONIA in the last 168 hours. Coagulation Profile: No results for input(s): INR, PROTIME in the last 168 hours. Cardiac Enzymes: No results for input(s): CKTOTAL, CKMB, CKMBINDEX, TROPONINI in the last 168 hours. BNP (last 3 results) No results for input(s): PROBNP in the last 8760 hours. HbA1C: No results for input(s): HGBA1C in the last 72 hours. CBG: No results for input(s): GLUCAP in the last 168 hours. Lipid Profile: No results for input(s): CHOL, HDL, LDLCALC, TRIG, CHOLHDL, LDLDIRECT in the last 72  hours. Thyroid  Function Tests: No results for input(s): TSH, T4TOTAL, FREET4, T3FREE, THYROIDAB in the last 72 hours. Anemia Panel: No results for input(s): VITAMINB12, FOLATE, FERRITIN, TIBC, IRON , RETICCTPCT in the last 72 hours. Urine analysis:    Component Value Date/Time   COLORURINE AMBER (A) 07/20/2023 1251   APPEARANCEUR CLEAR (A) 07/20/2023 1251   LABSPEC 1.019 07/20/2023 1251   PHURINE 6.0 07/20/2023 1251   GLUCOSEU NEGATIVE 07/20/2023 1251   HGBUR NEGATIVE 07/20/2023 1251   BILIRUBINUR NEGATIVE 07/20/2023 1251   KETONESUR NEGATIVE 07/20/2023 1251   PROTEINUR 30 (A) 07/20/2023 1251   NITRITE NEGATIVE 07/20/2023 1251   LEUKOCYTESUR NEGATIVE 07/20/2023 1251     Assessment and plan: Severe sepsis due to acute calculus cholecystitis: Her tachycardia, tachypnea, leukocytosis and lactic acidosis at [facility].  Currently tachypneic to 27.  BP stable.  RUQ US  and MRCP finding as above. -Recheck CMP, CBC, lactic acid -IV ceftriaxone and Flagyl -Plan for ERCP by GI tomorrow -May need general surgery evaluation after ERCP -IV NS with KCl -N.p.o. per GI.   Acute calculus cholecystitis Common bile duct stricture Elevated liver enzymes/hyperbilirubinemia - Management as above  History of chron's disease - Defer to GI  Myasthenia gravis: Stable. - Continue home Mestinon  - Hold methotrexate .  Elevated troponin/demand ischemia in the setting of severe sepsis - Manage sepsis as above  Hiatal hernia/GERD - IV Protonix   History of COPD: Stable - As needed bronchodilators.  Anion gap metabolic acidosis: Likely due to lactic acidosis. -Recheck lactic acid  BPH without LUTS -Continue Proscar  and Flomax .  Hyponatremia: Mild - Recheck  Hypokalemia -Monitor replenish K and Mg as appropriate      DVT prophylaxis: SCD  Code Status: Full code Family Communication: Updated patient's wife at bedside  Consults called: Gastroenterology Admission  status: Inpatient   Theadore Finger MD [facility]  If 7PM-7AM, please contact night-coverage [email or web address]  07/20/2023, 4:51 PM

## 2023-07-20 NOTE — ED Notes (Signed)
EMTALA reviewed by Charge RN 

## 2023-07-20 NOTE — Sepsis Progress Note (Addendum)
 Elink following code sepsis  1041 MD and bedside RN messaged requesting additional fluid bolus and repeat lactic acid

## 2023-07-20 NOTE — Progress Notes (Signed)
 myasthenia gravis, Crohn's disease, hypertension, COPD, and chronic arthritis who presents via EMS with complaints of lethargy and weakness upper abdominal pain nausea seen at [place] based on blood work patient had ultrasound and MRCP showing strictures.  Vitals:   07/20/23 0617 07/20/23 0630 07/20/23 0730 07/20/23 0815  BP: 105/73 102/69 (!) 85/67 94/64   07/20/23 0830 07/20/23 0900 07/20/23 1030 07/20/23 1300  BP: 103/75 99/72 106/72 133/87   Suggested additional 1 L.  Pt is guarded prognosis and low threshold fro ICU and may need overnight intervention.  Case has been d/w GI Dr.Hung who is accepting GI.   >> Lactic: Currently 5.2    Results for orders placed or performed during the hospital encounter of 07/20/23 (from the past 24 hours)  Brain natriuretic peptide     Status: Abnormal   Collection Time: 07/20/23  6:26 AM  Result Value Ref Range   B Natriuretic Peptide 180.0 (H) 0.0 - 100.0 pg/mL  Lactic acid, plasma     Status: Abnormal   Collection Time: 07/20/23  6:26 AM  Result Value Ref Range   Lactic Acid, Venous 3.8 (HH) 0.5 - 1.9 mmol/L  Comprehensive metabolic panel     Status: Abnormal   Collection Time: 07/20/23  6:29 AM  Result Value Ref Range   Sodium 134 (L) 135 - 145 mmol/L   Potassium 3.4 (L) 3.5 - 5.1 mmol/L   Chloride 103 98 - 111 mmol/L   CO2 17 (L) 22 - 32 mmol/L   Glucose, Bld 129 (H) 70 - 99 mg/dL   BUN 12 8 - 23 mg/dL   Creatinine, Ser 4.09 0.61 - 1.24 mg/dL   Calcium 8.5 (L) 8.9 - 10.3 mg/dL   Total Protein 6.9 6.5 - 8.1 g/dL   Albumin 3.4 (L) 3.5 - 5.0 g/dL   AST 811 (H) 15 - 41 U/L   ALT 285 (H) 0 - 44 U/L   Alkaline Phosphatase 223 (H) 38 - 126 U/L   Total Bilirubin 6.6 (H) 0.0 - 1.2 mg/dL   GFR, Estimated >91 >47 mL/min   Anion gap 14 5 - 15  Troponin I (High Sensitivity)     Status: Abnormal   Collection Time: 07/20/23  6:29 AM  Result Value Ref Range   Troponin I (High Sensitivity) 19 (H) <18 ng/L  CBC with Differential     Status:  Abnormal   Collection Time: 07/20/23  6:29 AM  Result Value Ref Range   WBC 16.5 (H) 4.0 - 10.5 K/uL   RBC 4.66 4.22 - 5.81 MIL/uL   Hemoglobin 13.8 13.0 - 17.0 g/dL   HCT 82.9 56.2 - 13.0 %   MCV 89.1 80.0 - 100.0 fL   MCH 29.6 26.0 - 34.0 pg   MCHC 33.3 30.0 - 36.0 g/dL   RDW 86.5 (H) 78.4 - 69.6 %   Platelets 185 150 - 400 K/uL   nRBC 0.0 0.0 - 0.2 %   Neutrophils Relative % 96 %   Neutro Abs 15.7 (H) 1.7 - 7.7 K/uL   Lymphocytes Relative 1 %   Lymphs Abs 0.2 (L) 0.7 - 4.0 K/uL   Monocytes Relative 2 %   Monocytes Absolute 0.4 0.1 - 1.0 K/uL   Eosinophils Relative 0 %   Eosinophils Absolute 0.0 0.0 - 0.5 K/uL   Basophils Relative 0 %   Basophils Absolute 0.0 0.0 - 0.1 K/uL   WBC Morphology MORPHOLOGY UNREMARKABLE    RBC Morphology MIXED RBC POPULATION    Smear Review  Normal platelet morphology    Immature Granulocytes 1 %   Abs Immature Granulocytes 0.22 (H) 0.00 - 0.07 K/uL  Troponin I (High Sensitivity)     Status: Abnormal   Collection Time: 07/20/23  8:05 AM  Result Value Ref Range   Troponin I (High Sensitivity) 19 (H) <18 ng/L  Lipase, blood     Status: None   Collection Time: 07/20/23  8:05 AM  Result Value Ref Range   Lipase 40 11 - 51 U/L  Lactic acid, plasma     Status: Abnormal   Collection Time: 07/20/23  9:07 AM  Result Value Ref Range   Lactic Acid, Venous 4.0 (HH) 0.5 - 1.9 mmol/L  Urinalysis, Routine w reflex microscopic -Urine, Clean Catch     Status: Abnormal   Collection Time: 07/20/23 12:51 PM  Result Value Ref Range   Color, Urine AMBER (A) YELLOW   APPearance CLEAR (A) CLEAR   Specific Gravity, Urine 1.019 1.005 - 1.030   pH 6.0 5.0 - 8.0   Glucose, UA NEGATIVE NEGATIVE mg/dL   Hgb urine dipstick NEGATIVE NEGATIVE   Bilirubin Urine NEGATIVE NEGATIVE   Ketones, ur NEGATIVE NEGATIVE mg/dL   Protein, ur 30 (A) NEGATIVE mg/dL   Nitrite NEGATIVE NEGATIVE   Leukocytes,Ua NEGATIVE NEGATIVE   RBC / HPF 0-5 0 - 5 RBC/hpf   WBC, UA 6-10 0 - 5  WBC/hpf   Bacteria, UA RARE (A) NONE SEEN   Squamous Epithelial / HPF 0-5 0 - 5 /HPF   Mucus PRESENT   Lactic acid, plasma     Status: Abnormal   Collection Time: 07/20/23 12:59 PM  Result Value Ref Range   Lactic Acid, Venous 5.2 (HH) 0.5 - 1.9 mmol/L     Mr abd with MRCP: Findings consistent with acute cholecystitis.   Mild diffuse biliary ductal dilatation, with abrupt termination of the mid common bile duct just superior to the pancreatic head, suspicious for stricture. No definite choledocholithiasis.   Mild diffuse pancreatic ductal dilatation with ductal irregularity, suspicious for chronic pancreatitis. No No evidence of pancreatic mass.   Large hiatal hernia.   Severe colonic diverticulosis.   3.1 cm infrarenal abdominal aortic aneurysm. Recommend follow-up ultrasound every 3 years. (Ref.: J Vasc Surg. 2018; 67:2-77 and J Am Coll Radiol 2013;10(10):789-794.)   [facility] accepted to [facility].

## 2023-07-20 NOTE — ED Triage Notes (Signed)
 BIB EMS for lewthargy and weakness. Upon arrival, patient C/O nausea. Patient was 90% on room air on their arrival to the home. Patient on [facility] on arrival. Not normally on O2.

## 2023-07-20 NOTE — Progress Notes (Signed)
 [facility] night cross cover note:   I was notified by our [facility] that the pt's med hx is not completed, and that [facility] has updated his current Mestinon  order to reflect his home dose of 60 mg po BID.   I reviewed updated med hx and have held home baby aspirin  in anticipation of ercp tomorrow and have resumed his home prednisolone eyedrops. Per chart review, we are currently deferring management of pt's h/o Crohn's disease to consulting GI. In this setting, I have refrained from reordering home Balsalazide at this time.     Camelia Cavalier, DO Hospitalist

## 2023-07-20 NOTE — ED Notes (Signed)
 Patient signed transfer consent on paper.

## 2023-07-20 NOTE — ED Notes (Signed)
 [place] at bedside

## 2023-07-20 NOTE — ED Notes (Signed)
 Pt transferred to [facility] stretcher after pt took a BM.

## 2023-07-20 NOTE — ED Provider Notes (Signed)
 [facility] Provider Note   Event Date/Time   First MD Initiated Contact with Patient 07/20/23 [phone]     (approximate) History  Weakness  HPI Kyle Holloway. is a 83 y.o. male with a past medical history of myasthenia gravis, Crohn's disease, hypertension, COPD, and chronic arthritis who presents via EMS with complaints of lethargy and weakness.  Upon arrival, patient's only complaint is nausea.  Patient was found to be 90% on room air and placed on 2 L nasal cannula en route.  Family told EMS that patient has had worsening generalized weakness over the last 2 days. ROS: Patient currently denies any vision changes, tinnitus, difficulty speaking, facial droop, sore throat, chest pain, shortness of breath, abdominal pain, nausea/vomiting/diarrhea, dysuria, or numbness/paresthesias in any extremity   Physical Exam  Triage Vital Signs: ED Triage Vitals  Encounter Vitals Group     BP      Girls Systolic BP Percentile      Girls Diastolic BP Percentile      Boys Systolic BP Percentile      Boys Diastolic BP Percentile      Pulse      Resp      Temp      Temp src      SpO2      Weight      Height      Head Circumference      Peak Flow      Pain Score      Pain Loc      Pain Education      Exclude from Growth Chart    Most recent vital signs: Vitals:   07/20/23 1315 07/20/23 1500  BP:  119/89  Pulse:  87  Resp: (!) 21 (!) 22  Temp:  98 F (36.7 C)  SpO2:  100%   General: Awake, cooperative CV:  Good peripheral perfusion. Resp:  Normal effort. Abd:  No distention. Other:  Elderly overweight Caucasian male resting comfortably in no acute distress ED Results / Procedures / Treatments  Labs (all labs ordered are listed, but only abnormal results are displayed) Labs Reviewed  COMPREHENSIVE METABOLIC PANEL WITH GFR - Abnormal; Notable for the following components:      Result Value   Sodium 134 (*)    Potassium 3.4 (*)    CO2 17 (*)     Glucose, Bld 129 (*)    Calcium 8.5 (*)    Albumin 3.4 (*)    AST 252 (*)    ALT 285 (*)    Alkaline Phosphatase 223 (*)    Total Bilirubin 6.6 (*)    All other components within normal limits  BRAIN NATRIURETIC PEPTIDE - Abnormal; Notable for the following components:   B Natriuretic Peptide 180.0 (*)    All other components within normal limits  LACTIC ACID, PLASMA - Abnormal; Notable for the following components:   Lactic Acid, Venous 3.8 (*)    All other components within normal limits  LACTIC ACID, PLASMA - Abnormal; Notable for the following components:   Lactic Acid, Venous 4.0 (*)    All other components within normal limits  CBC WITH DIFFERENTIAL/PLATELET - Abnormal; Notable for the following components:   WBC 16.5 (*)    RDW 25.5 (*)    Neutro Abs 15.7 (*)    Lymphs Abs 0.2 (*)    Abs Immature Granulocytes 0.22 (*)    All other components within normal limits  URINALYSIS, ROUTINE W REFLEX  MICROSCOPIC - Abnormal; Notable for the following components:   Color, Urine AMBER (*)    APPearance CLEAR (*)    Protein, ur 30 (*)    Bacteria, UA RARE (*)    All other components within normal limits  LACTIC ACID, PLASMA - Abnormal; Notable for the following components:   Lactic Acid, Venous 5.2 (*)    All other components within normal limits  TROPONIN I (HIGH SENSITIVITY) - Abnormal; Notable for the following components:   Troponin I (High Sensitivity) 19 (*)    All other components within normal limits  TROPONIN I (HIGH SENSITIVITY) - Abnormal; Notable for the following components:   Troponin I (High Sensitivity) 19 (*)    All other components within normal limits  CULTURE, BLOOD (ROUTINE X 2)  LIPASE, BLOOD   EKG ED ECG REPORT I, Charleen Conn, the attending physician, personally viewed and interpreted this ECG. Date: 07/20/2023 EKG Time: 0621 Rate: 117 Rhythm: Tachycardic sinus rhythm QRS Axis: normal Intervals: normal ST/T Wave abnormalities: normal Narrative  Interpretation: Tachycardic sinus rhythm.  No evidence of acute ischemia RADIOLOGY ED MD interpretation: Pending - All radiology independently interpreted and agree with radiology assessment Official radiology report(s): MR ABDOMEN MRCP W WO CONTAST Result Date: 07/20/2023 CLINICAL DATA:  Sepsis.  Elevated bilirubin and transaminitis. EXAM: MRI ABDOMEN WITHOUT AND WITH CONTRAST (INCLUDING MRCP) TECHNIQUE: Multiplanar multisequence MR imaging of the abdomen was performed both before and after the administration of intravenous contrast. Heavily T2-weighted images of the biliary and pancreatic ducts were obtained, and three-dimensional MRCP images were rendered by post processing. CONTRAST:  9mL GADAVIST  GADOBUTROL  1 MMOL/ML IV SOLN COMPARISON:  12/12/2020 FINDINGS: Lower chest: No acute findings. Hepatobiliary: No hepatic masses identified. Tiny gallstones and gallbladder sludge noted. Gallbladder is distended and shows mild diffuse wall thickening, consistent with acute cholecystitis. Mild diffuse biliary ductal dilatation is seen with common bile duct measuring up to 7 mm. Abrupt termination of mid common bile duct is seen just superior to the pancreatic head, suspicious for stricture. No definite choledocholithiasis. Pancreas: No evidence of pancreatic mass or acute pancreatitis. Mild diffuse pancreatic ductal dilatation is seen with ductal irregularity, suspicious for chronic pancreatitis. Spleen:  Within normal limits in size and appearance. Adrenals/Urinary Tract: No suspicious masses identified. No evidence of hydronephrosis. Stomach/Bowel: Large hiatal hernia is seen. Severe colonic diverticulosis, without signs of diverticulitis involving the visualized portions. Vascular/Lymphatic: No pathologically enlarged lymph nodes identified. 3.1 cm infrarenal abdominal aortic aneurysm. Other:  None. Musculoskeletal:  No suspicious bone lesions identified. IMPRESSION: Findings consistent with acute cholecystitis.  Mild diffuse biliary ductal dilatation, with abrupt termination of the mid common bile duct just superior to the pancreatic head, suspicious for stricture. No definite choledocholithiasis. Mild diffuse pancreatic ductal dilatation with ductal irregularity, suspicious for chronic pancreatitis. No No evidence of pancreatic mass. Large hiatal hernia. Severe colonic diverticulosis. 3.1 cm infrarenal abdominal aortic aneurysm. Recommend follow-up ultrasound every 3 years. (Ref.: J Vasc Surg. 2018; 67:2-77 and J Am Coll Radiol 2013;10(10):789-794.) Electronically Signed   By: Marlyce Sine M.D.   On: 07/20/2023 11:56   MR 3D Recon At Scanner Result Date: 07/20/2023 CLINICAL DATA:  Sepsis.  Elevated bilirubin and transaminitis. EXAM: MRI ABDOMEN WITHOUT AND WITH CONTRAST (INCLUDING MRCP) TECHNIQUE: Multiplanar multisequence MR imaging of the abdomen was performed both before and after the administration of intravenous contrast. Heavily T2-weighted images of the biliary and pancreatic ducts were obtained, and three-dimensional MRCP images were rendered by post processing. CONTRAST:  9mL GADAVIST  GADOBUTROL   1 MMOL/ML IV SOLN COMPARISON:  12/12/2020 FINDINGS: Lower chest: No acute findings. Hepatobiliary: No hepatic masses identified. Tiny gallstones and gallbladder sludge noted. Gallbladder is distended and shows mild diffuse wall thickening, consistent with acute cholecystitis. Mild diffuse biliary ductal dilatation is seen with common bile duct measuring up to 7 mm. Abrupt termination of mid common bile duct is seen just superior to the pancreatic head, suspicious for stricture. No definite choledocholithiasis. Pancreas: No evidence of pancreatic mass or acute pancreatitis. Mild diffuse pancreatic ductal dilatation is seen with ductal irregularity, suspicious for chronic pancreatitis. Spleen:  Within normal limits in size and appearance. Adrenals/Urinary Tract: No suspicious masses identified. No evidence of  hydronephrosis. Stomach/Bowel: Large hiatal hernia is seen. Severe colonic diverticulosis, without signs of diverticulitis involving the visualized portions. Vascular/Lymphatic: No pathologically enlarged lymph nodes identified. 3.1 cm infrarenal abdominal aortic aneurysm. Other:  None. Musculoskeletal:  No suspicious bone lesions identified. IMPRESSION: Findings consistent with acute cholecystitis. Mild diffuse biliary ductal dilatation, with abrupt termination of the mid common bile duct just superior to the pancreatic head, suspicious for stricture. No definite choledocholithiasis. Mild diffuse pancreatic ductal dilatation with ductal irregularity, suspicious for chronic pancreatitis. No No evidence of pancreatic mass. Large hiatal hernia. Severe colonic diverticulosis. 3.1 cm infrarenal abdominal aortic aneurysm. Recommend follow-up ultrasound every 3 years. (Ref.: J Vasc Surg. 2018; 67:2-77 and J Am Coll Radiol 2013;10(10):789-794.) Electronically Signed   By: Marlyce Sine M.D.   On: 07/20/2023 11:56   US  ABDOMEN LIMITED RUQ (LIVER/GB) Result Date: 07/20/2023 CLINICAL DATA:  [id number] Transaminitis [id number] EXAM: ULTRASOUND ABDOMEN LIMITED RIGHT UPPER QUADRANT COMPARISON:  March 19, 2023, February 14, 2023 FINDINGS: Gallbladder: Multiple cholelithiasis and layering sludge. No wall thickening visualized when remeasured; wall thickness appears the same compared to prior ultrasound. No sonographic Murphy sign noted by sonographer. Common bile duct: Diameter: Visualized portion measures 4 mm, within normal limits. Liver: No focal lesion identified. Within normal limits in parenchymal echogenicity. Portal vein is patent on color Doppler imaging with normal direction of blood flow towards the liver. Other: None. IMPRESSION: Cholelithiasis without sonographic evidence of acute cholecystitis. Electronically Signed   By: Clancy Crimes M.D.   On: 07/20/2023 08:18   DG Chest Portable 1 View Result Date:  07/20/2023 CLINICAL DATA:  weakness, leukocytosis, eval for pna EXAM: PORTABLE CHEST 1 VIEW COMPARISON:  November 07, 2021, December 28, 2017 FINDINGS: Evaluation is limited by rotation. The cardiomediastinal silhouette is unchanged in contour. No pleural effusion. No pneumothorax. Rounded LEFT retrocardiac opacity consistent with a hiatal hernia. Similar biapical scarring with bullous changes. No acute pleuroparenchymal abnormality. IMPRESSION: No acute cardiopulmonary abnormality. Electronically Signed   By: Clancy Crimes M.D.   On: 07/20/2023 08:16   CT Head Wo Contrast Result Date: 07/20/2023 EXAM: CT HEAD WITHOUT CONTRAST 07/20/2023 07:17:00 AM TECHNIQUE: CT of the head was performed without the administration of intravenous contrast. Automated exposure control, iterative reconstruction, and/or weight based adjustment of the mA/kV was utilized to reduce the radiation dose to as low as reasonably achievable. COMPARISON: CT head without contrast and MR head without contrast 12/28/2017. CLINICAL HISTORY: Mental status change, unknown cause. BIB EMS for lethargy and weakness. Upon arrival, patient complains of nausea. Patient was 90% on room air on their arrival to the home. Patient on [facility] on arrival. Not normally on O2. FINDINGS: BRAIN AND VENTRICLES: There is no acute intracranial hemorrhage, mass effect or midline shift. No abnormal extra-axial fluid collection. The gray-white differentiation is maintained without an acute infarct. There is  no hydrocephalus. Mild generalized atrophy and periventricular white matter disease demonstrate some progression since the prior study. ORBITS: Bilateral lens replacements are noted. The globes and orbits are otherwise within normal limits. SINUSES: The visualized paranasal sinuses and mastoid air cells demonstrate no acute abnormality. SOFT TISSUES AND SKULL: No acute abnormality of the visualized skull or soft tissues. IMPRESSION: 1. No acute intracranial  abnormality. 2. Mild generalized atrophy and periventricular white matter disease with some progression since the prior study. Electronically signed by: Audree Leas MD 07/20/2023 07:29 AM EDT RP Workstation: [id number]   PROCEDURES: Critical Care performed: No Procedures MEDICATIONS ORDERED IN ED: Medications  lactated ringers  bolus 1,000 mL (0 mLs Intravenous Stopped 07/20/23 0908)  ceFEPIme (MAXIPIME) 2 g in sodium chloride  0.9 % 100 mL IVPB (0 g Intravenous Stopped 07/20/23 0840)  metroNIDAZOLE (FLAGYL) IVPB 500 mg (0 mg Intravenous Stopped 07/20/23 0906)  lactated ringers  bolus 1,871 mL (0 mLs Intravenous Stopped 07/20/23 1249)  gadobutrol  (GADAVIST ) 1 MMOL/ML injection 9 mL (9 mLs Intravenous Contrast Given 07/20/23 1127)  piperacillin-tazobactam (ZOSYN) IVPB 3.375 g (0 g Intravenous Stopped 07/20/23 1320)  lactated ringers  bolus 1,000 mL (0 mLs Intravenous Stopped 07/20/23 1529)  pantoprazole  (PROTONIX ) injection 40 mg (40 mg Intravenous Given 07/20/23 1450)   IMPRESSION / MDM / ASSESSMENT AND PLAN / ED COURSE  I reviewed the triage vital signs and the nursing notes.                             The patient is on the cardiac monitor to evaluate for evidence of arrhythmia and/or significant heart rate changes. Patient's presentation is most consistent with acute presentation with potential threat to life or bodily function. Patient is a 83 year old male who presents for generalized weakness.  Differential diagnosis includes but is not limited to sepsis, ACS, CVA, ACS Laboratory and radiologic evaluation pending at the end of my shift Care of this patient will be signed out to the oncoming physician at the end of my shift.  All pertinent patient information conveyed and all questions answered.  All further care and disposition decisions will be made by the oncoming physician. Clinical Course as of 07/21/23 0316  Sat Jul 20, 2023  0705 S/o: 48M gen weakness x2 days Mild nausea  here Limited historian  Labs w/ leukocytosis, new transaminitis  RUQ US  pending [MM]  0724 CT head interpreted by myself, no intracranial hemorrhage, formal radiology report pending [MM]  0727 Ecg = sinus tachycardia, rate 117, no ST elevation or depression, + right bundle branch block, no clear evidence of ischemia or arrhythmia on my interpretation [MM]  0727 Troponin mildly elevated, suspect type II NSTEMI in setting of underlying infection  Labs notable for leukocytosis as well as notably elevated bilirubin and associated transaminitis.  High concern for choledocholithiasis/cholecystitis/cholangitis.  Will add blood cultures and broad-spectrum antibiotics.  Currently on ultrasound. [MM]  0749 CTH: IMPRESSION: 1. No acute intracranial abnormality. 2. Mild generalized atrophy and periventricular white matter disease with some progression since the prior study.   [MM]  0822 US : IMPRESSION: Cholelithiasis without sonographic evidence of acute cholecystitis.   [MM]  8295 CXR: IMPRESSION: No acute cardiopulmonary abnormality.   [MM]  1230 GI paged to discuss [MM]  1234 D/w Dr. Baldomero Bone of GI - recs transfer given no availability of ERCP over weekend - broaden to zosyn [MM]  1243 Patient and family updated, amenable to transfer, would prefer [facility] if possible  ED  clerk initiating transfer process [MM]  1306 D/w Dr. Nickey Barn of GI at [facility] - accepts for transfer, tentative plan for ERCP tomorrow - will be admitted to hospitalist service  Transfer center to call me back to d/w hospitalist [MM]  1341 Dr Brunilda Capra hospitalist accepts - accepted to [facility]  [MM]    Clinical Course User Index [MM] Collis Deaner, MD   FINAL CLINICAL IMPRESSION(S) / ED DIAGNOSES   Final diagnoses:  Severe sepsis (HCC)  Cholangitis  Cholecystitis   Rx / DC Orders   ED Discharge Orders     None      Note:  This document was prepared using Dragon voice recognition  software and may include unintentional dictation errors.   Sonam Huelsmann K, MD 07/21/23 [phone]

## 2023-07-21 ENCOUNTER — Other Ambulatory Visit: Payer: Self-pay

## 2023-07-21 ENCOUNTER — Encounter (HOSPITAL_COMMUNITY)
Admission: EM | Disposition: A | Discharge status: Home / Self Care | Payer: Self-pay | Source: Other Acute Inpatient Hospital | Attending: Student

## 2023-07-21 ENCOUNTER — Encounter (HOSPITAL_COMMUNITY): Payer: Self-pay | Admitting: Student

## 2023-07-21 ENCOUNTER — Inpatient Hospital Stay (HOSPITAL_COMMUNITY)

## 2023-07-21 ENCOUNTER — Inpatient Hospital Stay (HOSPITAL_COMMUNITY): Admitting: Anesthesiology

## 2023-07-21 DIAGNOSIS — G7000 Myasthenia gravis without (acute) exacerbation: Secondary | ICD-10-CM

## 2023-07-21 DIAGNOSIS — K219 Gastro-esophageal reflux disease without esophagitis: Secondary | ICD-10-CM | POA: Diagnosis not present

## 2023-07-21 DIAGNOSIS — K8001 Calculus of gallbladder with acute cholecystitis with obstruction: Secondary | ICD-10-CM | POA: Diagnosis not present

## 2023-07-21 DIAGNOSIS — J449 Chronic obstructive pulmonary disease, unspecified: Secondary | ICD-10-CM

## 2023-07-21 DIAGNOSIS — K8309 Other cholangitis: Secondary | ICD-10-CM | POA: Insufficient documentation

## 2023-07-21 DIAGNOSIS — A419 Sepsis, unspecified organism: Secondary | ICD-10-CM | POA: Diagnosis not present

## 2023-07-21 DIAGNOSIS — K831 Obstruction of bile duct: Secondary | ICD-10-CM | POA: Diagnosis not present

## 2023-07-21 DIAGNOSIS — K5090 Crohn's disease, unspecified, without complications: Secondary | ICD-10-CM

## 2023-07-21 HISTORY — PX: BILIARY STENT PLACEMENT: SHX5538

## 2023-07-21 HISTORY — PX: ERCP: SHX5425

## 2023-07-21 HISTORY — PX: BILIARY BRUSHING: SHX6843

## 2023-07-21 HISTORY — PX: SPHINCTEROTOMY: SHX5279

## 2023-07-21 LAB — CBC
HCT: 35.7 % — ABNORMAL LOW (ref 39.0–52.0)
Hemoglobin: 11.8 g/dL — ABNORMAL LOW (ref 13.0–17.0)
MCH: 29.6 pg (ref 26.0–34.0)
MCHC: 33.1 g/dL (ref 30.0–36.0)
MCV: 89.5 fL (ref 80.0–100.0)
Platelets: 174 10*3/uL (ref 150–400)
RBC: 3.99 MIL/uL — ABNORMAL LOW (ref 4.22–5.81)
RDW: 26 % — ABNORMAL HIGH (ref 11.5–15.5)
WBC: 19 10*3/uL — ABNORMAL HIGH (ref 4.0–10.5)
nRBC: 0 % (ref 0.0–0.2)

## 2023-07-21 LAB — LACTIC ACID, PLASMA: Lactic Acid, Venous: 1.3 mmol/L (ref 0.5–1.9)

## 2023-07-21 LAB — COMPREHENSIVE METABOLIC PANEL WITH GFR
ALT: 185 U/L — ABNORMAL HIGH (ref 0–44)
AST: 115 U/L — ABNORMAL HIGH (ref 15–41)
Albumin: 2.9 g/dL — ABNORMAL LOW (ref 3.5–5.0)
Alkaline Phosphatase: 162 U/L — ABNORMAL HIGH (ref 38–126)
Anion gap: 10 (ref 5–15)
BUN: 11 mg/dL (ref 8–23)
CO2: 22 mmol/L (ref 22–32)
Calcium: 8.3 mg/dL — ABNORMAL LOW (ref 8.9–10.3)
Chloride: 104 mmol/L (ref 98–111)
Creatinine, Ser: 0.94 mg/dL (ref 0.61–1.24)
GFR, Estimated: >60 mL/min (ref >60)
Glucose, Bld: 144 mg/dL — ABNORMAL HIGH (ref 70–99)
Potassium: 4.2 mmol/L (ref 3.5–5.1)
Sodium: 136 mmol/L (ref 135–145)
Total Bilirubin: 5.2 mg/dL — ABNORMAL HIGH (ref 0.0–1.2)
Total Protein: 6.5 g/dL (ref 6.5–8.1)

## 2023-07-21 LAB — MAGNESIUM: Magnesium: 1.8 mg/dL (ref 1.7–2.4)

## 2023-07-21 LAB — MRSA NEXT GEN BY PCR, NASAL: MRSA by PCR Next Gen: NOT DETECTED

## 2023-07-21 LAB — LIPASE, BLOOD: Lipase: 42 U/L (ref 11–51)

## 2023-07-21 SURGERY — ERCP, WITH INTERVENTION IF INDICATED
Anesthesia: General

## 2023-07-21 MED ORDER — ONDANSETRON HCL 4 MG/2ML IJ SOLN
INTRAMUSCULAR | Status: DC | PRN
  Administered 2023-07-21: 4 mg via INTRAVENOUS

## 2023-07-21 MED ORDER — LIDOCAINE 2% (20 MG/ML) 5 ML SYRINGE
INTRAMUSCULAR | Status: DC | PRN
  Administered 2023-07-21: 60 mg via INTRAVENOUS

## 2023-07-21 MED ORDER — LACTATED RINGERS IV SOLN: INTRAVENOUS | Status: DC | PRN

## 2023-07-21 MED ORDER — FENTANYL CITRATE (PF) 100 MCG/2ML IJ SOLN
INTRAMUSCULAR | Status: AC
Start: 2023-07-21 — End: 2023-07-21
  Filled 2023-07-21: qty 2

## 2023-07-21 MED ORDER — FENTANYL CITRATE (PF) 250 MCG/5ML IJ SOLN
INTRAMUSCULAR | Status: DC | PRN
  Administered 2023-07-21 (×2): 50 ug via INTRAVENOUS

## 2023-07-21 MED ORDER — ROCURONIUM BROMIDE 10 MG/ML (PF) SYRINGE
PREFILLED_SYRINGE | INTRAVENOUS | Status: DC | PRN
  Administered 2023-07-21: 50 mg via INTRAVENOUS

## 2023-07-21 MED ORDER — DICLOFENAC SUPPOSITORY 100 MG
RECTAL | Status: DC | PRN
  Administered 2023-07-21: 100 mg via RECTAL

## 2023-07-21 MED ORDER — PROPOFOL 10 MG/ML IV BOLUS
INTRAVENOUS | Status: DC | PRN
  Administered 2023-07-21: 120 mg via INTRAVENOUS

## 2023-07-21 MED ORDER — SODIUM CHLORIDE 0.9 % IV SOLN
INTRAVENOUS | Status: DC | PRN
  Administered 2023-07-21: 30 mL

## 2023-07-21 MED ORDER — DEXAMETHASONE SODIUM PHOSPHATE 10 MG/ML IJ SOLN
INTRAMUSCULAR | Status: DC | PRN
  Administered 2023-07-21: 10 mg via INTRAVENOUS

## 2023-07-21 MED ORDER — SUGAMMADEX SODIUM 200 MG/2ML IV SOLN
INTRAVENOUS | Status: DC | PRN
  Administered 2023-07-21: 200 mg via INTRAVENOUS

## 2023-07-21 MED ORDER — PHENYLEPHRINE 80 MCG/ML (10ML) SYRINGE FOR IV PUSH (FOR BLOOD PRESSURE SUPPORT)
PREFILLED_SYRINGE | INTRAVENOUS | Status: DC | PRN
  Administered 2023-07-21 (×3): 80 ug via INTRAVENOUS

## 2023-07-21 NOTE — Op Note (Signed)
 [facility] Patient Name: Kyle Holloway Procedure Date : 07/21/2023 MRN: [id number] Attending MD: Alvis Jourdain , MD, [id number] Date of Birth: 1940-09-03 CSN: [id number] Age: 83 Admit Type: Inpatient Procedure:                ERCP Indications:              Common bile duct stricture Providers:                Alvis Jourdain, MD, Perla Bradford, RN, Gabino Joe,                            Technician Referring MD:             Asenath Blacker. Mian Medicines:                General Anesthesia Complications:            No immediate complications. Estimated Blood Loss:     Estimated blood loss: none. Procedure:                Pre-Anesthesia Assessment:                           - Prior to the procedure, a History and Physical                            was performed, and patient medications and                            allergies were reviewed. The patient's tolerance of                            previous anesthesia was also reviewed. The risks                            and benefits of the procedure and the sedation                            options and risks were discussed with the patient.                            All questions were answered, and informed consent                            was obtained. Prior Anticoagulants: The patient has                            taken no anticoagulant or antiplatelet agents. ASA                            Grade Assessment: III - A patient with severe                            systemic disease. After reviewing the risks and  benefits, the patient was deemed in satisfactory                            condition to undergo the procedure.                           - Sedation was administered by an anesthesia                            professional. General anesthesia was attained.                           After obtaining informed consent, the scope was                            passed under direct vision.  Throughout the                            procedure, the patient's blood pressure, pulse, and                            oxygen saturations were monitored continuously. The                            [id number]-[id number] ([id number] Olympus duodenoscope was                            introduced through the mouth, and used to inject                            contrast into and used to inject contrast into the                            bile duct. The ERCP was accomplished without                            difficulty. The patient tolerated the procedure                            well. Scope In: Scope Out: Findings:      The major papilla was normal. The bile duct was deeply cannulated with       the short-nosed traction sphincterotome. Contrast was injected. I       personally interpreted the bile duct images. There was brisk flow of       contrast through the ducts. Image quality was excellent. Contrast       extended to the hepatic ducts. The common bile duct contained a single       moderate segmental stenosis 3 mm in length. A short 0.035 inch Soft       Jagwire was passed into the biliary tree. A 7 mm biliary sphincterotomy       was made with a monofilament traction (standard) sphincterotome using       ERBE electrocautery. There was no post-sphincterotomy bleeding. To       discover objects, the biliary tree was swept  with a 12 mm balloon       starting at the bifurcation. Pus was swept from the duct. Cells for       cytology were obtained by brushing in the lower third of the main bile       duct. One 7 Fr by 5 cm plastic stent with a single external flap and a       single internal flap was placed 4.5 cm into the common bile duct. Clear       fluid and pus flowed through the stent. The stent was in good position.      A large hiatal hernia was enountered with insertion of the duodenoscope.       Initial visualization of the ampulla showed spontaneous drainage of pus.       The CBD was  cannulated with the first attempt. The ampulla was flatter       and there was evidence of a small periampullary diverticulum. A careful       and limited sphincterotomy was created. Contrast injection revealed a       mildly dilated CBD and filling of the cystic duct. There was no clear       evidence of any stones in the CBD. A 3 cm distal CBD stricture was       identified. In order to further interrogate the stricture a 9-12 mm       balloon was inserted. It was difficult inserting the balloon catheter,       but it was advanced through the stricture after multiple positionings       and jacking the catheter with the elevator. The proximal and mid CBD       were swept and pus was extruded from the ampulla. The balloon lodged in       the proximal portion of this distal stricture, which was consistent with       the MRCP. The stricture was brushed multiple times using two separate       brushes to increase the yield for cytology. Subsequently a 7 Fr x 5 cm       plastic biliary stent was inserted with mild difficulty. Excellent       placement drainage of pus was achieved. This was confirmed with       fluoroscopy. Impression:               - The major papilla appeared normal.                           - A single moderate segmental biliary stricture was                            found in the common bile duct. The stricture was                            indeterminate.                           - A biliary sphincterotomy was performed.                           - The biliary tree was swept and pus was found.                           -  Cells for cytology obtained in the lower third of                            the main duct.                           - One plastic stent was placed into the common bile                            duct.                           - Large hiatal hernia. Recommendation:           - Return patient to hospital ward for ongoing care.                            - Await cytology results.                           - Consult Surgery for Lap chole.                           - Follow up with Dr. Barnie Bora, primary GI upon                            discharge.                           - [facility] will follow up tomorrow AM. Procedure Code(s):        --- Professional ---                           [id number], Endoscopic retrograde                            cholangiopancreatography (ERCP); with placement of                            endoscopic stent into biliary or pancreatic duct,                            including pre- and post-dilation and guide wire                            passage, when performed, including sphincterotomy,                            when performed, each stent                           27253, Endoscopic catheterization of the biliary                            ductal system, radiological supervision and  interpretation Diagnosis Code(s):        --- Professional ---                           K83.1, Obstruction of bile duct CPT copyright 2022 American Medical Association. All rights reserved. The codes documented in this report are preliminary and upon coder review may  be revised to meet current compliance requirements. Alvis Jourdain, MD Alvis Jourdain, MD 07/21/2023 10:28:33 AM This report has been signed electronically. Number of Addenda: 0

## 2023-07-21 NOTE — Anesthesia Procedure Notes (Signed)
 Procedure Name: Intubation Date/Time: 07/21/2023 9:34 AM  Performed by: Hebert Littler, CRNAPre-anesthesia Checklist: Patient identified, Emergency Drugs available, Suction available, Patient being monitored and Timeout performed Patient Re-evaluated:Patient Re-evaluated prior to induction Oxygen Delivery Method: Circle system utilized Preoxygenation: Pre-oxygenation with 100% oxygen Induction Type: IV induction Ventilation: Mask ventilation without difficulty and Oral airway inserted - appropriate to patient size Laryngoscope Size: Mac and 3 Grade View: Grade I Tube size: 7.0 mm Number of attempts: 1 Placement Confirmation: ETT inserted through vocal cords under direct vision, positive ETCO2, CO2 detector and breath sounds checked- equal and bilateral Secured at: 23 cm Tube secured with: Tape

## 2023-07-21 NOTE — Interval H&P Note (Signed)
 History and Physical Interval Note:  07/21/2023 9:34 AM  Kyle Kotyk Sr.  has presented today for surgery, with the diagnosis of Biliary stricture.  The various methods of treatment have been discussed with the patient and family. After consideration of risks, benefits and other options for treatment, the patient has consented to  Procedure(s): ERCP, WITH INTERVENTION IF INDICATED (N/A) as a surgical intervention.  The patient's history has been reviewed, patient examined, no change in status, stable for surgery.  I have reviewed the patient's chart and labs.  Questions were answered to the patient's satisfaction.     Sofiah Lyne D

## 2023-07-21 NOTE — Consult Note (Signed)
 Kyle Kotyk Sr. Feb 26, 1940  [id number].    Requesting MD: Carry Clapper  Chief Complaint/Reason for Consult: Abdominal pain/Acute gallstone pancreatitis   HPI: Kyle TORAL Sr. is a 83 y.o. M with PMH of Crohn's disease, COPD presents with complaints of abdominal pain.  Patient underwent ERCP today and was recommended to surgery for acute gallstone cholecystitis.  Past Medical History: As below Prior Abdominal Surgeries: None Blood Thinners: Takes aspirin  Last PO intake: Had some pudding approximately 20 minutes prior to arrival Allergies: None Tobacco Use: Denies Alcohol Use: Denies Substance use: Denies   ROS: Review of Systems  Constitutional:  Negative for chills and fever.  Gastrointestinal:  Positive for abdominal pain. Negative for nausea and vomiting.    Family History  Problem Relation Age of Onset   Brain cancer Father     Past Medical History:  Diagnosis Date   Arthritis    COPD (chronic obstructive pulmonary disease) (HCC)    Crohn's disease (HCC)    Emphysema, unspecified (HCC)    Hypertension    Myasthenia gravis (HCC)     Past Surgical History:  Procedure Laterality Date   COLONOSCOPY WITH PROPOFOL  N/A 03/20/2023   Procedure: COLONOSCOPY WITH PROPOFOL ;  Surgeon: Marnee Sink, MD;  Location: [facility] ENDOSCOPY;  Service: Endoscopy;  Laterality: N/A;   EMBOLIZATION (CATH LAB) N/A 03/21/2023   Procedure: EMBOLIZATION;  Surgeon: Jackquelyn Mass, MD;  Location: [facility] INVASIVE CV LAB;  Service: Cardiovascular;  Laterality: N/A;   ESOPHAGOGASTRODUODENOSCOPY N/A 03/19/2023   Procedure: ESOPHAGOGASTRODUODENOSCOPY (EGD);  Surgeon: Marnee Sink, MD;  Location: [facility] ENDOSCOPY;  Service: Endoscopy;  Laterality: N/A;   JOINT REPLACEMENT Left    total knee replacement    Dr. Aubry Blase   TONSILLECTOMY     TOTAL KNEE ARTHROPLASTY Right 11/20/2021   Procedure: RIGHT TOTAL KNEE ARTHROPLASTY;  Surgeon: Wendolyn Hamburger, MD;  Location: [facility] ORS;  Service: Orthopedics;   Laterality: Right;   WISDOM TOOTH EXTRACTION      Social History:  reports that he has quit smoking. His smoking use included cigarettes. He has never used smokeless tobacco. He reports that he does not currently use alcohol. He reports that he does not use drugs.  Allergies: No Known Allergies  Medications Prior to Admission  Medication Sig Dispense Refill   acetaminophen  (TYLENOL ) 325 MG tablet Take 650 mg by mouth 3 (three) times daily as needed (pain).     albuterol  (PROVENTIL  HFA;VENTOLIN  HFA) 108 (90 Base) MCG/ACT inhaler Inhale 2 puffs into the lungs every 6 (six) hours as needed for wheezing or shortness of breath. 1 Inhaler 0   aspirin  EC 81 MG tablet Take 81 mg by mouth daily.      atenolol (TENORMIN) 25 MG tablet Take 1 tablet by mouth daily.     balsalazide (COLAZAL ) 750 MG capsule Take 750 mg by mouth 3 (three) times daily.  3   Biotin 1 MG CAPS Take 1 mg by mouth daily.     Cholecalciferol (VITAMIN D3) 50 MCG (2000 UT) TABS Take 2,000 Units by mouth daily.     cyanocobalamin  (VITAMIN B12) 1000 MCG tablet Take 1,000 mcg by mouth daily.     Ferrous Sulfate (IRON  PO) Take 1 tablet by mouth daily.     finasteride  (PROSCAR ) 5 MG tablet Take 5 mg by mouth daily.  3   folic acid  (FOLVITE ) 1 MG tablet Take 1 mg by mouth daily.     methotrexate  (RHEUMATREX) 2.5 MG tablet Take 2 tablets (5 mg  total) by mouth once a week. Every Wednesday  Caution:Chemotherapy. Protect from light. (Patient taking differently: Take 10 mg by mouth once a week. On Wednesday.) 4 tablet 0   Multiple Vitamins-Minerals (MULTIVITAMIN WITH MINERALS) tablet Take 1 tablet by mouth 2 (two) times daily with breakfast and lunch.     Multiple Vitamins-Minerals (PRESERVISION AREDS PO) Take 2 tablets by mouth daily.     Omega-3 Fatty Acids (FISH OIL) 1000 MG CAPS Take 1,000 mg by mouth daily.     omeprazole  (PRILOSEC) 40 MG capsule Take 1 capsule (40 mg total) by mouth 2 (two) times daily before a meal. 60 capsule 0    prednisoLONE acetate (PRED FORTE) 1 % ophthalmic suspension Place 1 drop into the left eye at bedtime.     pyridostigmine  (MESTINON ) 60 MG tablet Take 0.5 tablets (30 mg total) by mouth every 8 (eight) hours. (Patient taking differently: Take 60 mg by mouth in the morning and at bedtime. See Rx fill hx and office visit note 01/18/23) 90 tablet 0   tamsulosin  (FLOMAX ) 0.4 MG CAPS capsule Take 0.4 mg by mouth daily.  3     Physical Exam: Blood pressure (!) 141/93, pulse 82, temperature 98.1 F (36.7 C), resp. rate 20, height 5' 10 (1.778 m), weight 94.3 kg, SpO2 95%. Physical Exam Constitutional:      General: He is not in acute distress.  Cardiovascular:     Rate and Rhythm: Normal rate and regular rhythm.  Pulmonary:     Effort: Pulmonary effort is normal. No respiratory distress.  Abdominal:     Palpations: Abdomen is soft.     Tenderness: There is abdominal tenderness. There is no guarding.     Hernia: A hernia is present.     Comments: Minimal abdominal tenderness noted on exam    Skin:    General: Skin is warm and dry.   Neurological:     General: No focal deficit present.     Mental Status: He is alert and oriented to person, place, and time.      Results for orders placed or performed during the hospital encounter of 07/20/23 (from the past 48 hours)  CBC with Differential/Platelet     Status: Abnormal   Collection Time: 07/20/23  5:32 PM  Result Value Ref Range   WBC 27.5 (H) 4.0 - 10.5 K/uL   RBC 4.07 (L) 4.22 - 5.81 MIL/uL   Hemoglobin 12.1 (L) 13.0 - 17.0 g/dL   HCT 16.1 (L) 09.6 - 04.5 %   MCV 89.2 80.0 - 100.0 fL   MCH 29.7 26.0 - 34.0 pg   MCHC 33.3 30.0 - 36.0 g/dL   RDW 40.9 (H) 81.1 - 91.4 %   Platelets 209 150 - 400 K/uL   nRBC 0.0 0.0 - 0.2 %   Neutrophils Relative % 94 %   Neutro Abs 25.9 (H) 1.7 - 7.7 K/uL   Lymphocytes Relative 4 %   Lymphs Abs 1.1 0.7 - 4.0 K/uL   Monocytes Relative 2 %   Monocytes Absolute 0.6 0.1 - 1.0 K/uL   Eosinophils  Relative 0 %   Eosinophils Absolute 0.0 0.0 - 0.5 K/uL   Basophils Relative 0 %   Basophils Absolute 0.0 0.0 - 0.1 K/uL   WBC Morphology See Note     Comment: Mild Left Shift. 1 to 5% Metas, occ myelo   Smear Review See Note     Comment: Normal Platelet Morphology   nRBC 0 0 /100 WBC  Abs Immature Granulocytes 0.00 0.00 - 0.07 K/uL   Polychromasia PRESENT     Comment: Performed at [facility], [address] [place], [place] [address]  Magnesium     Status: Abnormal   Collection Time: 07/20/23  5:32 PM  Result Value Ref Range   Magnesium 1.6 (L) 1.7 - 2.4 mg/dL    Comment: Performed at [facility], [address], [place], [place] [address]  Lipase, blood     Status: None   Collection Time: 07/20/23  5:32 PM  Result Value Ref Range   Lipase 32 11 - 51 U/L    Comment: Performed at [facility], [address], [place], [place] [address]  Comprehensive metabolic panel with GFR     Status: Abnormal   Collection Time: 07/20/23  5:32 PM  Result Value Ref Range   Sodium 137 135 - 145 mmol/L   Potassium 4.4 3.5 - 5.1 mmol/L   Chloride 105 98 - 111 mmol/L   CO2 22 22 - 32 mmol/L   Glucose, Bld 111 (H) 70 - 99 mg/dL    Comment: Glucose reference range applies only to samples taken after fasting for at least 8 hours.   BUN 12 8 - 23 mg/dL   Creatinine, Ser 9.14 0.61 - 1.24 mg/dL   Calcium 8.5 (L) 8.9 - 10.3 mg/dL   Total Protein 6.5 6.5 - 8.1 g/dL   Albumin 2.9 (L) 3.5 - 5.0 g/dL   AST 782 (H) 15 - 41 U/L   ALT 235 (H) 0 - 44 U/L   Alkaline Phosphatase 191 (H) 38 - 126 U/L   Total Bilirubin 6.3 (H) 0.0 - 1.2 mg/dL   GFR, Estimated >95 >62 mL/min    Comment: (NOTE) Calculated using the CKD-EPI Creatinine Equation (2021)    Anion gap 10 5 - 15    Comment: Performed at [facility], [address] [place], [place] [address]  Lactic acid, plasma     Status: Abnormal   Collection Time: 07/20/23  5:32 PM  Result Value Ref Range   Lactic Acid, Venous 3.1  (HH) 0.5 - 1.9 mmol/L    Comment: CRITICAL RESULT CALLED TO, READ BACK BY AND VERIFIED WITH K,SICKLES RN @1810  07/20/23 E,BENTON Performed at [facility], [address] [place], [place] [address]   Lactic acid, plasma     Status: Abnormal   Collection Time: 07/20/23  8:19 PM  Result Value Ref Range   Lactic Acid, Venous 3.0 (HH) 0.5 - 1.9 mmol/L    Comment: CRITICAL VALUE NOTED. VALUE IS CONSISTENT WITH PREVIOUSLY REPORTED/CALLED VALUE Performed at [facility], [address] [place], [place] [address]   MRSA Next Gen by PCR, Nasal     Status: None   Collection Time: 07/21/23  6:27 AM   Specimen: Nasal Mucosa; Nasal Swab  Result Value Ref Range   MRSA by PCR Next Gen NOT DETECTED NOT DETECTED    Comment: (NOTE) The GeneXpert MRSA Assay (FDA approved for NASAL specimens only), is one component of a comprehensive MRSA colonization surveillance program. It is not intended to diagnose MRSA infection nor to guide or monitor treatment for MRSA infections. Test performance is not FDA approved in patients less than 52 years old. Performed at [facility], [address] [place], [place] [address]    MR ABDOMEN MRCP W WO CONTAST Result Date: 07/20/2023 CLINICAL DATA:  Sepsis.  Elevated bilirubin and transaminitis.  EXAM: MRI ABDOMEN WITHOUT AND WITH CONTRAST (INCLUDING MRCP) TECHNIQUE: Multiplanar multisequence MR imaging of the abdomen was performed both before and after the administration of intravenous contrast. Heavily T2-weighted images of the biliary and pancreatic ducts were obtained, and three-dimensional MRCP images were rendered by post processing. CONTRAST:  9mL GADAVIST  GADOBUTROL  1 MMOL/ML IV SOLN COMPARISON:  12/12/2020 FINDINGS: Lower chest: No acute findings. Hepatobiliary: No hepatic masses identified. Tiny gallstones and gallbladder sludge noted. Gallbladder is distended and shows mild diffuse wall thickening, consistent with acute cholecystitis. Mild diffuse  biliary ductal dilatation is seen with common bile duct measuring up to 7 mm. Abrupt termination of mid common bile duct is seen just superior to the pancreatic head, suspicious for stricture. No definite choledocholithiasis. Pancreas: No evidence of pancreatic mass or acute pancreatitis. Mild diffuse pancreatic ductal dilatation is seen with ductal irregularity, suspicious for chronic pancreatitis. Spleen:  Within normal limits in size and appearance. Adrenals/Urinary Tract: No suspicious masses identified. No evidence of hydronephrosis. Stomach/Bowel: Large hiatal hernia is seen. Severe colonic diverticulosis, without signs of diverticulitis involving the visualized portions. Vascular/Lymphatic: No pathologically enlarged lymph nodes identified. 3.1 cm infrarenal abdominal aortic aneurysm. Other:  None. Musculoskeletal:  No suspicious bone lesions identified. IMPRESSION: Findings consistent with acute cholecystitis. Mild diffuse biliary ductal dilatation, with abrupt termination of the mid common bile duct just superior to the pancreatic head, suspicious for stricture. No definite choledocholithiasis. Mild diffuse pancreatic ductal dilatation with ductal irregularity, suspicious for chronic pancreatitis. No No evidence of pancreatic mass. Large hiatal hernia. Severe colonic diverticulosis. 3.1 cm infrarenal abdominal aortic aneurysm. Recommend follow-up ultrasound every 3 years. (Ref.: J Vasc Surg. 2018; 67:2-77 and J Am Coll Radiol 2013;10(10):789-794.) Electronically Signed   By: Marlyce Sine M.D.   On: 07/20/2023 11:56   MR 3D Recon At Scanner Result Date: 07/20/2023 CLINICAL DATA:  Sepsis.  Elevated bilirubin and transaminitis. EXAM: MRI ABDOMEN WITHOUT AND WITH CONTRAST (INCLUDING MRCP) TECHNIQUE: Multiplanar multisequence MR imaging of the abdomen was performed both before and after the administration of intravenous contrast. Heavily T2-weighted images of the biliary and pancreatic ducts were obtained,  and three-dimensional MRCP images were rendered by post processing. CONTRAST:  9mL GADAVIST  GADOBUTROL  1 MMOL/ML IV SOLN COMPARISON:  12/12/2020 FINDINGS: Lower chest: No acute findings. Hepatobiliary: No hepatic masses identified. Tiny gallstones and gallbladder sludge noted. Gallbladder is distended and shows mild diffuse wall thickening, consistent with acute cholecystitis. Mild diffuse biliary ductal dilatation is seen with common bile duct measuring up to 7 mm. Abrupt termination of mid common bile duct is seen just superior to the pancreatic head, suspicious for stricture. No definite choledocholithiasis. Pancreas: No evidence of pancreatic mass or acute pancreatitis. Mild diffuse pancreatic ductal dilatation is seen with ductal irregularity, suspicious for chronic pancreatitis. Spleen:  Within normal limits in size and appearance. Adrenals/Urinary Tract: No suspicious masses identified. No evidence of hydronephrosis. Stomach/Bowel: Large hiatal hernia is seen. Severe colonic diverticulosis, without signs of diverticulitis involving the visualized portions. Vascular/Lymphatic: No pathologically enlarged lymph nodes identified. 3.1 cm infrarenal abdominal aortic aneurysm. Other:  None. Musculoskeletal:  No suspicious bone lesions identified. IMPRESSION: Findings consistent with acute cholecystitis. Mild diffuse biliary ductal dilatation, with abrupt termination of the mid common bile duct just superior to the pancreatic head, suspicious for stricture. No definite choledocholithiasis. Mild diffuse pancreatic ductal dilatation with ductal irregularity, suspicious for chronic pancreatitis. No No evidence of pancreatic mass. Large hiatal hernia. Severe colonic diverticulosis. 3.1 cm infrarenal abdominal aortic aneurysm. Recommend follow-up ultrasound every 3  years. (Ref.: J Vasc Surg. 2018; 67:2-77 and J Am Coll Radiol 2013;10(10):789-794.) Electronically Signed   By: Marlyce Sine M.D.   On: 07/20/2023 11:56   US   ABDOMEN LIMITED RUQ (LIVER/GB) Result Date: 07/20/2023 CLINICAL DATA:  [id number] Transaminitis [id number] EXAM: ULTRASOUND ABDOMEN LIMITED RIGHT UPPER QUADRANT COMPARISON:  March 19, 2023, February 14, 2023 FINDINGS: Gallbladder: Multiple cholelithiasis and layering sludge. No wall thickening visualized when remeasured; wall thickness appears the same compared to prior ultrasound. No sonographic Murphy sign noted by sonographer. Common bile duct: Diameter: Visualized portion measures 4 mm, within normal limits. Liver: No focal lesion identified. Within normal limits in parenchymal echogenicity. Portal vein is patent on color Doppler imaging with normal direction of blood flow towards the liver. Other: None. IMPRESSION: Cholelithiasis without sonographic evidence of acute cholecystitis. Electronically Signed   By: Clancy Crimes M.D.   On: 07/20/2023 08:18   DG Chest Portable 1 View Result Date: 07/20/2023 CLINICAL DATA:  weakness, leukocytosis, eval for pna EXAM: PORTABLE CHEST 1 VIEW COMPARISON:  November 07, 2021, December 28, 2017 FINDINGS: Evaluation is limited by rotation. The cardiomediastinal silhouette is unchanged in contour. No pleural effusion. No pneumothorax. Rounded LEFT retrocardiac opacity consistent with a hiatal hernia. Similar biapical scarring with bullous changes. No acute pleuroparenchymal abnormality. IMPRESSION: No acute cardiopulmonary abnormality. Electronically Signed   By: Clancy Crimes M.D.   On: 07/20/2023 08:16   CT Head Wo Contrast Result Date: 07/20/2023 EXAM: CT HEAD WITHOUT CONTRAST 07/20/2023 07:17:00 AM TECHNIQUE: CT of the head was performed without the administration of intravenous contrast. Automated exposure control, iterative reconstruction, and/or weight based adjustment of the mA/kV was utilized to reduce the radiation dose to as low as reasonably achievable. COMPARISON: CT head without contrast and MR head without contrast 12/28/2017. CLINICAL HISTORY: Mental  status change, unknown cause. BIB EMS for lethargy and weakness. Upon arrival, patient complains of nausea. Patient was 90% on room air on their arrival to the home. Patient on [facility] on arrival. Not normally on O2. FINDINGS: BRAIN AND VENTRICLES: There is no acute intracranial hemorrhage, mass effect or midline shift. No abnormal extra-axial fluid collection. The gray-white differentiation is maintained without an acute infarct. There is no hydrocephalus. Mild generalized atrophy and periventricular white matter disease demonstrate some progression since the prior study. ORBITS: Bilateral lens replacements are noted. The globes and orbits are otherwise within normal limits. SINUSES: The visualized paranasal sinuses and mastoid air cells demonstrate no acute abnormality. SOFT TISSUES AND SKULL: No acute abnormality of the visualized skull or soft tissues. IMPRESSION: 1. No acute intracranial abnormality. 2. Mild generalized atrophy and periventricular white matter disease with some progression since the prior study. Electronically signed by: Audree Leas MD 07/20/2023 07:29 AM EDT RP Workstation: [id number]    Anti-infectives (From admission, onward)    Start     Dose/Rate Route Frequency Ordered Stop   07/20/23 1745  [MAR Hold]  cefTRIAXone (ROCEPHIN) 2 g in sodium chloride  0.9 % 100 mL IVPB        (MAR Hold since Sun 07/21/2023 at 0902.Hold Reason: Transfer to a Procedural area)   2 g 200 mL/hr over 30 Minutes Intravenous Every 24 hours 07/20/23 1645     07/20/23 1745  [MAR Hold]  metroNIDAZOLE (FLAGYL) IVPB 500 mg        (MAR Hold since Sun 07/21/2023 at 0902.Hold Reason: Transfer to a Procedural area)   500 mg 100 mL/hr over 60 Minutes Intravenous Every 12 hours 07/20/23 1645  Assessment/Plan Acute Gallstone Pancreatitis  Kyle Kotyk Sr. is a 83 y.o. M with PMH of Crohn's disease, COPD presents with complaints of abdominal pain.  Patient underwent ERCP today and was recommended  for surgery for acute gallstone cholecystitis.  Since ERCP was done today, patient may not have operation today.  Patient does not have significant tenderness to palpation in epigastric or any other quadrants at the time of exam.  Patient has white count of 27 with lactate of 3.  Plan for laparoscopic cholecystectomy with cholangiogram.  Discussed findings with my attending.  FEN -regular now, NPO at midnight. VTE - SCD's ID -Rocephin and Flagyl Foley - None Dispo - Admit to [facility].   I reviewed nursing notes, hospitalist notes, last 24 h vitals and pain scores, last 48 h intake and output, last 24 h labs and trends, and last 24 h imaging results.  This care required high  level of medical decision making.   Mirta Ammon, PA-C [facility] 07/21/2023, 10:50 AM Please see Amion for pager number during day hours 7:00am-4:30pm

## 2023-07-21 NOTE — Transfer of Care (Signed)
 Immediate Anesthesia Transfer of Care Note  Patient: Kyle Kotyk Sr.  Procedure(s) Performed: ERCP, WITH INTERVENTION IF INDICATED BRUSH BIOPSY, BILE DUCT INSERTION, STENT, BILE DUCT SPHINCTEROTOMY, GI  Patient Location: PACU  Anesthesia Type:General  Level of Consciousness: awake, alert , oriented, and patient cooperative  Airway & Oxygen Therapy: Patient Spontanous Breathing and Patient connected to face mask oxygen  Post-op Assessment: Report given to RN, Post -op Vital signs reviewed and stable, Patient moving all extremities, Patient moving all extremities X 4, and Patient able to stick tongue midline  Post vital signs: Reviewed and stable  Last Vitals:  Vitals Value Taken Time  BP 119/80 07/21/23 10:26  Temp    Pulse 91 07/21/23 10:28  Resp 14 07/21/23 10:28  SpO2 94 % 07/21/23 10:28  Vitals shown include unfiled device data.  Last Pain:  Vitals:   07/21/23 0904  TempSrc: Temporal  PainSc: 0-No pain         Complications: No notable events documented.

## 2023-07-21 NOTE — Anesthesia Preprocedure Evaluation (Addendum)
 Anesthesia Evaluation  Patient identified by MRN, date of birth, ID band Patient awake    Reviewed: Allergy & Precautions, H&P , NPO status , Patient's Chart, lab work & pertinent test results, reviewed documented beta blocker date and time   Airway Mallampati: II  TM Distance: >3 FB Neck ROM: Full    Dental no notable dental hx. (+) Edentulous Upper, Edentulous Lower   Pulmonary COPD,  COPD inhaler, former smoker   Pulmonary exam normal breath sounds clear to auscultation       Cardiovascular hypertension, Pt. on medications and Pt. on home beta blockers  Rhythm:Regular Rate:Normal     Neuro/Psych  Neuromuscular disease  negative psych ROS   GI/Hepatic Neg liver ROS,GERD  Medicated,,  Endo/Other  negative endocrine ROS    Renal/GU negative Renal ROS  negative genitourinary   Musculoskeletal  (+) Arthritis , Osteoarthritis,    Abdominal   Peds  Hematology  (+) Blood dyscrasia, anemia   Anesthesia Other Findings   Reproductive/Obstetrics negative OB ROS                             Anesthesia Physical Anesthesia Plan  ASA: 2  Anesthesia Plan: General   Post-op Pain Management: Minimal or no pain anticipated   Induction: Intravenous  PONV Risk Score and Plan: 2 and Ondansetron  and Dexamethasone   Airway Management Planned: Oral ETT  Additional Equipment:   Intra-op Plan:   Post-operative Plan: Extubation in OR  Informed Consent: I have reviewed the patients History and Physical, chart, labs and discussed the procedure including the risks, benefits and alternatives for the proposed anesthesia with the patient or authorized representative who has indicated his/her understanding and acceptance.     Dental advisory given  Plan Discussed with: CRNA  Anesthesia Plan Comments:        Anesthesia Quick Evaluation

## 2023-07-21 NOTE — OR Nursing (Signed)
 PACU received hand-off to give Mestinon  Tablet ASAP when pt is alert per Anesthesia recommendation. Lucetta Russel [place]

## 2023-07-21 NOTE — Plan of Care (Signed)

## 2023-07-21 NOTE — Anesthesia Postprocedure Evaluation (Signed)
 Anesthesia Post Note  Patient: Kyle Kotyk Sr.  Procedure(s) Performed: ERCP, WITH INTERVENTION IF INDICATED BRUSH BIOPSY, BILE DUCT INSERTION, STENT, BILE DUCT SPHINCTEROTOMY, GI     Patient location during evaluation: PACU Anesthesia Type: General Level of consciousness: awake and alert Pain management: pain level controlled Vital Signs Assessment: post-procedure vital signs reviewed and stable Respiratory status: spontaneous breathing, nonlabored ventilation and respiratory function stable Cardiovascular status: blood pressure returned to baseline and stable Postop Assessment: no apparent nausea or vomiting Anesthetic complications: no  No notable events documented.  Last Vitals:  Vitals:   07/21/23 1045 07/21/23 1100  BP: (!) 141/93 136/85  Pulse: 82 83  Resp: 20 16  Temp:  36.7 C  SpO2: 95% 94%    Last Pain:  Vitals:   07/21/23 1026  TempSrc:   PainSc: 0-No pain                 Shaneil Yazdi,W. EDMOND

## 2023-07-21 NOTE — OR Nursing (Signed)
 Daughter requested procedure staff update her. Pt states that is fine as he and his wife are hard of hearing. Kyle Holloway

## 2023-07-21 NOTE — Plan of Care (Signed)

## 2023-07-21 NOTE — Progress Notes (Signed)
 PROGRESS NOTE  Kyle SAMBRANO Sr. UJW:[id number] DOB: [birth year]   PCP: Nestor Banter, MD  Patient is from: Home through [facility].  Independently ambulates at baseline.  DOA: 07/20/2023 LOS: 1  Chief complaints No chief complaint on file.    Brief Narrative / Interim history: 83 year old M with PMH of Crohn's disease, myasthenia gravis, esophageal stricture, diaphragmatic hernia, GERD, COPD, BPH and hard of hearing presented to [facility] ED with acute lethargy, weakness, upper abdominal pain, subjective fever, nausea and spitting up, and admitted with severe sepsis in the setting of acute calculus cholecystitis and acute cholangitis.  Initially presented to [facility].  Had elevated LFTs, hyperbilirubinemia.  RUQ US  concerning for gallstone.  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  Blood culture obtained.Received IV fluid and started on IV Zosyn, and he was transferred to [facility] for ERCP since there is no availability of ERCP over the weekend at [facility].  Blood cultures NGTD.  Had ERCP with biliary sphincterectomy and plastic stent to CBD.  Biliary tree was swept and pus was found.  Cell cytology obtained.  General surgery consulted for lap chole.  Remains on ceftriaxone and Flagyl.   Subjective: Seen and examined earlier this afternoon after he returned from ERCP. No complaints.  Objective: Vitals:   07/21/23 1045 07/21/23 1100 07/21/23 1200 07/21/23 1502  BP: (!) 141/93 136/85 (!) 140/79 (!) 153/97  Pulse: 82 83 89   Resp: 20 16 16    Temp:  98 F (36.7 C) 97.9 F (36.6 C) 97.7 F (36.5 C)  TempSrc:  Oral Oral Oral  SpO2: 95% 94% 93% 96%  Weight:      Height:         Examination:  GENERAL: No apparent distress.  Nontoxic. HEENT: MMM.  Vision and hearing grossly intact.  NECK: Supple.  No apparent JVD.  RESP:  No IWOB.  Fair aeration bilaterally. CVS:  RRR. Heart sounds normal.  ABD/GI/GU: BS+. Abd soft, NTND.  MSK/EXT:  Moves extremities. No apparent deformity. No edema.  SKIN: no apparent skin lesion or wound NEURO: AA.  Oriented appropriately.  No apparent focal neuro deficit. PSYCH: Calm. Normal affect.   Consultants:  Gastroenterology General Surgery  Procedures: 6/15-ERCP  Microbiology summarized: 6/14-blood culture at [facility] NGTD  Assessment and plan: Severe sepsis due to acute calculus cholecystitis and acute cholangitis: Her tachycardia, tachypnea, leukocytosis and lactic acidosis at [facility].  Currently tachypneic to 27.  BP stable.  RUQ US , MRCP and ERCP finding as above.  Lactic acidosis removed.  Leukocytosis improved. -Continue IV ceftriaxone and Flagyl -Follow-up biliary duct cell cytology -General Surgery consulted-plan for lap chole with cholangiogram on 6/60 -N.p.o. after midnight     Acute calculus cholecystitis: Antibiotics and laparoscopic cholecystectomy as above Common bile duct stricture: s/p ERCP and plastic stent to common bile duct stricture Elevated liver enzymes/hyperbilirubinemia: Improved -Management as above -Recheck liver enzymes in the morning   History of chron's disease: Stable - Holding home Colazal  in the setting of infection.  Discussed with GI.   Myasthenia gravis: Stable. - Continue home Mestinon  - Hold methotrexate  in the setting of infection.   Elevated troponin/demand ischemia in the setting of severe sepsis - Manage sepsis as above   Hiatal hernia/GERD - IV Protonix    History  of COPD: Stable - As needed bronchodilators.   Anion gap metabolic acidosis: Likely due to lactic acidosis.  Resolved.   BPH without LUTS -Continue Proscar  and Flomax .   Hyponatremia: Mild.   Resolved.  Hypokalemia -Monitor replenish K and Mg as appropriate  Leukocytosis/bandemia: Due to #1.  Improving.  Body mass index is 29.84 kg/m.           DVT prophylaxis:  SCDs Start: 07/20/23 1727  Code Status: Full code Family Communication: None at bedside today Level of care: Telemetry Medical Status is: Inpatient Remains inpatient appropriate because: Severe sepsis, acute calculus cholecystitis, cholangitis and biliary stricture   Final disposition: Likely home once medically stable   55 minutes with more than 50% spent in reviewing records, counseling patient/family and coordinating care.   Sch Meds:  Scheduled Meds:  finasteride   5 mg Oral Daily   pantoprazole  (PROTONIX ) IV  40 mg Intravenous Q24H   prednisoLONE acetate  1 drop Left Eye QHS   pyridostigmine   60 mg Oral BID   tamsulosin   0.4 mg Oral Daily   Continuous Infusions:  cefTRIAXone (ROCEPHIN)  IV 2 g (07/20/23 1757)   dextrose  5 % and 0.9 % NaCl 100 mL/hr at 07/21/23 0738   metronidazole 500 mg (07/21/23 0600)   PRN Meds:.albuterol , fentaNYL  (SUBLIMAZE ) injection, ondansetron  **OR** ondansetron  (ZOFRAN ) IV, oxyCODONE   Antimicrobials: Anti-infectives (From admission, onward)    Start     Dose/Rate Route Frequency Ordered Stop   07/20/23 1745  cefTRIAXone (ROCEPHIN) 2 g in sodium chloride  0.9 % 100 mL IVPB        2 g 200 mL/hr over 30 Minutes Intravenous Every 24 hours 07/20/23 1645     07/20/23 1745  metroNIDAZOLE (FLAGYL) IVPB 500 mg        500 mg 100 mL/hr over 60 Minutes Intravenous Every 12 hours 07/20/23 1645          I have personally reviewed the following labs and images: CBC: Recent Labs  Lab 07/20/23 0629 07/20/23 1732 07/21/23 1152  WBC 16.5* 27.5* 19.0*  NEUTROABS 15.7* 25.9*  --   HGB 13.8 12.1* 11.8*  HCT 41.5 36.3* 35.7*  MCV 89.1 89.2 89.5  PLT 185 209 174   BMP &GFR Recent Labs  Lab 07/20/23 0629 07/20/23 1732 07/21/23 1152  NA 134* 137 136  K 3.4* 4.4  4.2  CL 103 105 104  CO2 17* 22 22  GLUCOSE 129* 111* 144*  BUN 12 12 11   CREATININE 0.98 1.16 0.94  CALCIUM 8.5* 8.5* 8.3*  MG  --  1.6* 1.8   Estimated Creatinine Clearance: 68.6 mL/min (by C-G formula based on SCr of 0.94 mg/dL). Liver & Pancreas: Recent Labs  Lab 07/20/23 0629 07/20/23 1732 07/21/23 1152  AST 252* 165* 115*  ALT 285* 235* 185*  ALKPHOS 223* 191* 162*  BILITOT 6.6* 6.3* 5.2*  PROT 6.9 6.5 6.5  ALBUMIN 3.4* 2.9* 2.9*   Recent Labs  Lab 07/20/23 0805 07/20/23 1732 07/21/23 1152  LIPASE 40 32 42   No results for input(s): AMMONIA in the last 168 hours. Diabetic: No results for input(s): HGBA1C in the last 72 hours. No results for input(s): GLUCAP in the last 168 hours. Cardiac Enzymes: No results for input(s): CKTOTAL, CKMB, CKMBINDEX, TROPONINI in the last 168 hours. No results for input(s): PROBNP in the last 8760 hours. Coagulation Profile: No results for input(s): INR, PROTIME in the last 168 hours. Thyroid  Function Tests: No results for input(s): TSH, T4TOTAL, FREET4, T3FREE,  THYROIDAB in the last 72 hours. Lipid Profile: No results for input(s): CHOL, HDL, LDLCALC, TRIG, CHOLHDL, LDLDIRECT in the last 72 hours. Anemia Panel: No results for input(s): VITAMINB12, FOLATE, FERRITIN, TIBC, IRON , RETICCTPCT in the last 72 hours. Urine analysis:    Component Value Date/Time   COLORURINE AMBER (A) 07/20/2023 1251   APPEARANCEUR CLEAR (A) 07/20/2023 1251   LABSPEC 1.019 07/20/2023 1251   PHURINE 6.0 07/20/2023 1251   GLUCOSEU NEGATIVE 07/20/2023 1251   HGBUR NEGATIVE 07/20/2023 1251   BILIRUBINUR NEGATIVE 07/20/2023 1251   KETONESUR NEGATIVE 07/20/2023 1251   PROTEINUR 30 (A) 07/20/2023 1251   NITRITE NEGATIVE 07/20/2023 1251   LEUKOCYTESUR NEGATIVE 07/20/2023 1251   Sepsis Labs: Invalid input(s): PROCALCITONIN, LACTICIDVEN  Microbiology: Recent Results (from the past 240 hours)   Blood culture (routine x 2)     Status: None (Preliminary result)   Collection Time: 07/20/23  8:05 AM   Specimen: BLOOD  Result Value Ref Range Status   Specimen Description BLOOD LEFT ANTECUBITAL  Final   Special Requests   Final    BOTTLES DRAWN AEROBIC AND ANAEROBIC Blood Culture adequate volume   Culture   Final    NO GROWTH < 24 HOURS Performed at [facility], [address] [place], [place] [address]    Report Status PENDING  Incomplete  MRSA Next Gen by PCR, Nasal     Status: None   Collection Time: 07/21/23  6:27 AM   Specimen: Nasal Mucosa; Nasal Swab  Result Value Ref Range Status   MRSA by PCR Next Gen NOT DETECTED NOT DETECTED Final    Comment: (NOTE) The GeneXpert MRSA Assay (FDA approved for NASAL specimens only), is one component of a comprehensive MRSA colonization surveillance program. It is not intended to diagnose MRSA infection nor to guide or monitor treatment for MRSA infections. Test performance is not FDA approved in patients less than 38 years old. Performed at [facility], [address], [place], [place] [address]     Radiology Studies: DG ERCP Result Date: 07/21/2023 CLINICAL DATA:  Acute cholecystitis, biliary duct stricture EXAM: ERCP TECHNIQUE: Multiple spot images obtained with the fluoroscopic device and submitted for interpretation post-procedure. FLUOROSCOPY: Radiation Exposure Index (as provided by the fluoroscopic device): 62.354 mGy Kerma COMPARISON:  MRCP 07/20/2023 FINDINGS: A total of 4 intraoperative saved images are submitted for review. The images demonstrate a flexible duodenal scope in the descending duodenum followed by wire cannulation of the common bile duct. Cholangiography is then performed. Mild intra and extrahepatic biliary ductal dilatation. The cystic duct is patent. The final image demonstrates placement of a plastic biliary stent. IMPRESSION: 1. Mild intra and extrahepatic biliary ductal dilatation with  stenosis/occlusion in the mid common bile duct. 2. The cystic duct appears patent. 3. Placement of a plastic biliary stent. These images were submitted for radiologic interpretation only. Please see the procedural report for the amount of contrast and the fluoroscopy time utilized. Electronically Signed   By: Fernando Hoyer M.D.   On: 07/21/2023 11:45      Dewight Catino T. Ilze Roselli [facility]  If 7PM-7AM, please contact night-coverage [email or web address] 07/21/2023, 3:43 PM

## 2023-07-22 ENCOUNTER — Encounter (HOSPITAL_COMMUNITY): Payer: Self-pay | Admitting: Gastroenterology

## 2023-07-22 DIAGNOSIS — K8001 Calculus of gallbladder with acute cholecystitis with obstruction: Secondary | ICD-10-CM | POA: Diagnosis not present

## 2023-07-22 DIAGNOSIS — A419 Sepsis, unspecified organism: Secondary | ICD-10-CM | POA: Diagnosis not present

## 2023-07-22 DIAGNOSIS — K8062 Calculus of gallbladder and bile duct with acute cholecystitis without obstruction: Secondary | ICD-10-CM

## 2023-07-22 DIAGNOSIS — G7000 Myasthenia gravis without (acute) exacerbation: Secondary | ICD-10-CM | POA: Diagnosis not present

## 2023-07-22 DIAGNOSIS — K219 Gastro-esophageal reflux disease without esophagitis: Secondary | ICD-10-CM | POA: Diagnosis not present

## 2023-07-22 LAB — COMPREHENSIVE METABOLIC PANEL WITH GFR
ALT: 160 U/L — ABNORMAL HIGH (ref 0–44)
AST: 106 U/L — ABNORMAL HIGH (ref 15–41)
Albumin: 2.6 g/dL — ABNORMAL LOW (ref 3.5–5.0)
Alkaline Phosphatase: 180 U/L — ABNORMAL HIGH (ref 38–126)
Anion gap: 8 (ref 5–15)
BUN: 14 mg/dL (ref 8–23)
CO2: 21 mmol/L — ABNORMAL LOW (ref 22–32)
Calcium: 8.2 mg/dL — ABNORMAL LOW (ref 8.9–10.3)
Chloride: 107 mmol/L (ref 98–111)
Creatinine, Ser: 1 mg/dL (ref 0.61–1.24)
GFR, Estimated: >60 mL/min (ref >60)
Glucose, Bld: 127 mg/dL — ABNORMAL HIGH (ref 70–99)
Potassium: 3.9 mmol/L (ref 3.5–5.1)
Sodium: 136 mmol/L (ref 135–145)
Total Bilirubin: 3.8 mg/dL — ABNORMAL HIGH (ref 0.0–1.2)
Total Protein: 6.2 g/dL — ABNORMAL LOW (ref 6.5–8.1)

## 2023-07-22 LAB — CBC
HCT: 35.1 % — ABNORMAL LOW (ref 39.0–52.0)
Hemoglobin: 11.4 g/dL — ABNORMAL LOW (ref 13.0–17.0)
MCH: 28.8 pg (ref 26.0–34.0)
MCHC: 32.5 g/dL (ref 30.0–36.0)
MCV: 88.6 fL (ref 80.0–100.0)
Platelets: 173 10*3/uL (ref 150–400)
RBC: 3.96 MIL/uL — ABNORMAL LOW (ref 4.22–5.81)
RDW: 26.2 % — ABNORMAL HIGH (ref 11.5–15.5)
WBC: 18.4 10*3/uL — ABNORMAL HIGH (ref 4.0–10.5)
nRBC: 0 % (ref 0.0–0.2)

## 2023-07-22 LAB — MAGNESIUM: Magnesium: 1.9 mg/dL (ref 1.7–2.4)

## 2023-07-22 MED ORDER — PANTOPRAZOLE SODIUM 40 MG PO TBEC: 40.0000 mg | DELAYED_RELEASE_TABLET | Freq: Every day | ORAL | Status: DC

## 2023-07-22 MED ORDER — HYDRALAZINE HCL 20 MG/ML IJ SOLN: 10.0000 mg | INTRAMUSCULAR | Status: DC | PRN

## 2023-07-22 MED ORDER — HYDRALAZINE HCL 25 MG PO TABS
25.0000 mg | ORAL_TABLET | Freq: Three times a day (TID) | ORAL | Status: DC
  Administered 2023-07-23: 25 mg via ORAL
  Filled 2023-07-22 (×4): qty 1

## 2023-07-22 MED ORDER — PANTOPRAZOLE SODIUM 40 MG PO TBEC
40.0000 mg | DELAYED_RELEASE_TABLET | Freq: Every day | ORAL | Status: DC
  Administered 2023-07-23 – 2023-07-26 (×3): 40 mg via ORAL
  Filled 2023-07-22 (×4): qty 1

## 2023-07-22 MED ORDER — HYDRALAZINE HCL 20 MG/ML IJ SOLN
5.0000 mg | Freq: Once | INTRAMUSCULAR | Status: AC
  Filled 2023-07-22: qty 1

## 2023-07-22 MED ORDER — ATENOLOL 25 MG PO TABS
25.0000 mg | ORAL_TABLET | Freq: Every day | ORAL | Status: DC
  Administered 2023-07-23 – 2023-07-26 (×3): 25 mg via ORAL
  Filled 2023-07-22 (×5): qty 1

## 2023-07-22 NOTE — TOC CM/SW Note (Signed)
 Transition of Care [facility]) - Inpatient Brief Assessment   Patient Details  Name: Kyle HOCHMUTH Sr. MRN: [id number] Date of Birth: 1940-12-16  Transition of Care [facility]) CM/SW Contact:    Juliane Och, LCSW Phone Number: 07/22/2023, 9:45 AM   Clinical Narrative:  9:45 AM Per chart review, patient resides at home with spouse. Patient has a PCP and insurance. Patient does not have SNF/HH/DME history. Patient's preferred pharmacy's are [facility] [address] and [facility]. No TOC needs were identified at this time. TOC will continue to follow and be available to assist.  Transition of Care Asessment: Insurance and Status: Insurance coverage has been reviewed Patient has primary care physician: Yes Home environment has been reviewed: Private Residence Prior level of function:: N/A Prior/Current Home Services: No current home services Social Drivers of Health Review: SDOH reviewed no interventions necessary Readmission risk has been reviewed: Yes Transition of care needs: no transition of care needs at this time

## 2023-07-22 NOTE — Progress Notes (Signed)
 1 Day Post-Op  Subjective: CC: Patient found sitting on side of bed with wife at bedside.  Patient states that he does not have any pain at this time, he had a bowel movement earlier this morning and is passing flatus.  He denies nausea or vomiting.  Patient is hemodynamically stable and in no acute distress.  Objective: Vital signs in last 24 hours: Temp:  [97.6 F (36.4 C)-98.1 F (36.7 C)] 97.6 F (36.4 C) (06/16 0721) Pulse Rate:  [75-100] 83 (06/16 0721) Resp:  [16-20] 19 (06/16 0721) BP: (90-163)/(61-117) 90/61 (06/16 0721) SpO2:  [93 %-97 %] 96 % (06/16 0721) Weight:  [94.3 kg] 94.3 kg (06/15 0732) Last BM Date : 07/20/23  Intake/Output from previous day: 06/15 0701 - 06/16 0700 In: 2470.8 [I.V.:1829.6; IV Piggyback:641.2] Out: -  Intake/Output this shift: No intake/output data recorded.  PE: Physical Exam Constitutional:      General: He is not in acute distress.    Appearance: He is not ill-appearing.   Cardiovascular:     Rate and Rhythm: Normal rate.  Pulmonary:     Effort: Pulmonary effort is normal. No respiratory distress.  Abdominal:     General: There is no distension.     Palpations: Abdomen is soft.     Tenderness: There is no abdominal tenderness. There is no guarding or rebound.   Skin:    General: Skin is warm and dry.   Neurological:     General: No focal deficit present.     Mental Status: He is alert and oriented to person, place, and time.     Lab Results:  Recent Labs    07/21/23 1152 07/22/23 0303  WBC 19.0* 18.4*  HGB 11.8* 11.4*  HCT 35.7* 35.1*  PLT 174 173   BMET Recent Labs    07/21/23 1152 07/22/23 0303  NA 136 136  K 4.2 3.9  CL 104 107  CO2 22 21*  GLUCOSE 144* 127*  BUN 11 14  CREATININE 0.94 1.00  CALCIUM 8.3* 8.2*   PT/INR No results for input(s): LABPROT, INR in the last 72 hours. CMP     Component Value Date/Time   NA 136 07/22/2023 0303   NA 137 06/17/2013 0548   K 3.9 07/22/2023 0303    K 4.3 06/17/2013 0548   CL 107 07/22/2023 0303   CL 110 (H) 06/17/2013 0548   CO2 21 (L) 07/22/2023 0303   CO2 21 06/17/2013 0548   GLUCOSE 127 (H) 07/22/2023 0303   GLUCOSE 147 (H) 06/17/2013 0548   BUN 14 07/22/2023 0303   BUN 17 06/17/2013 0548   CREATININE 1.00 07/22/2023 0303   CREATININE 0.94 06/17/2013 0548   CALCIUM 8.2 (L) 07/22/2023 0303   CALCIUM 8.4 (L) 06/17/2013 0548   PROT 6.2 (L) 07/22/2023 0303   PROT 8.1 06/16/2013 1923   ALBUMIN 2.6 (L) 07/22/2023 0303   ALBUMIN 3.7 06/16/2013 1923   AST 106 (H) 07/22/2023 0303   AST 14 (L) 06/16/2013 1923   ALT 160 (H) 07/22/2023 0303   ALT 22 06/16/2013 1923   ALKPHOS 180 (H) 07/22/2023 0303   ALKPHOS 62 06/16/2013 1923   BILITOT 3.8 (H) 07/22/2023 0303   BILITOT 0.6 06/16/2013 1923   GFRNONAA >60 07/22/2023 0303   GFRNONAA >60 06/17/2013 0548   GFRAA >60 01/02/2018 1011   GFRAA >60 06/17/2013 0548   Lipase     Component Value Date/Time   LIPASE 42 07/21/2023 1152    Studies/Results: DG ERCP  Result Date: 07/21/2023 CLINICAL DATA:  Acute cholecystitis, biliary duct stricture EXAM: ERCP TECHNIQUE: Multiple spot images obtained with the fluoroscopic device and submitted for interpretation post-procedure. FLUOROSCOPY: Radiation Exposure Index (as provided by the fluoroscopic device): 62.354 mGy Kerma COMPARISON:  MRCP 07/20/2023 FINDINGS: A total of 4 intraoperative saved images are submitted for review. The images demonstrate a flexible duodenal scope in the descending duodenum followed by wire cannulation of the common bile duct. Cholangiography is then performed. Mild intra and extrahepatic biliary ductal dilatation. The cystic duct is patent. The final image demonstrates placement of a plastic biliary stent. IMPRESSION: 1. Mild intra and extrahepatic biliary ductal dilatation with stenosis/occlusion in the mid common bile duct. 2. The cystic duct appears patent. 3. Placement of a plastic biliary stent. These images were  submitted for radiologic interpretation only. Please see the procedural report for the amount of contrast and the fluoroscopy time utilized. Electronically Signed   By: Fernando Hoyer M.D.   On: 07/21/2023 11:45   MR ABDOMEN MRCP W WO CONTAST Result Date: 07/20/2023 CLINICAL DATA:  Sepsis.  Elevated bilirubin and transaminitis. EXAM: MRI ABDOMEN WITHOUT AND WITH CONTRAST (INCLUDING MRCP) TECHNIQUE: Multiplanar multisequence MR imaging of the abdomen was performed both before and after the administration of intravenous contrast. Heavily T2-weighted images of the biliary and pancreatic ducts were obtained, and three-dimensional MRCP images were rendered by post processing. CONTRAST:  9mL GADAVIST  GADOBUTROL  1 MMOL/ML IV SOLN COMPARISON:  12/12/2020 FINDINGS: Lower chest: No acute findings. Hepatobiliary: No hepatic masses identified. Tiny gallstones and gallbladder sludge noted. Gallbladder is distended and shows mild diffuse wall thickening, consistent with acute cholecystitis. Mild diffuse biliary ductal dilatation is seen with common bile duct measuring up to 7 mm. Abrupt termination of mid common bile duct is seen just superior to the pancreatic head, suspicious for stricture. No definite choledocholithiasis. Pancreas: No evidence of pancreatic mass or acute pancreatitis. Mild diffuse pancreatic ductal dilatation is seen with ductal irregularity, suspicious for chronic pancreatitis. Spleen:  Within normal limits in size and appearance. Adrenals/Urinary Tract: No suspicious masses identified. No evidence of hydronephrosis. Stomach/Bowel: Large hiatal hernia is seen. Severe colonic diverticulosis, without signs of diverticulitis involving the visualized portions. Vascular/Lymphatic: No pathologically enlarged lymph nodes identified. 3.1 cm infrarenal abdominal aortic aneurysm. Other:  None. Musculoskeletal:  No suspicious bone lesions identified. IMPRESSION: Findings consistent with acute cholecystitis. Mild  diffuse biliary ductal dilatation, with abrupt termination of the mid common bile duct just superior to the pancreatic head, suspicious for stricture. No definite choledocholithiasis. Mild diffuse pancreatic ductal dilatation with ductal irregularity, suspicious for chronic pancreatitis. No No evidence of pancreatic mass. Large hiatal hernia. Severe colonic diverticulosis. 3.1 cm infrarenal abdominal aortic aneurysm. Recommend follow-up ultrasound every 3 years. (Ref.: J Vasc Surg. 2018; 67:2-77 and J Am Coll Radiol 2013;10(10):789-794.) Electronically Signed   By: Marlyce Sine M.D.   On: 07/20/2023 11:56   MR 3D Recon At Scanner Result Date: 07/20/2023 CLINICAL DATA:  Sepsis.  Elevated bilirubin and transaminitis. EXAM: MRI ABDOMEN WITHOUT AND WITH CONTRAST (INCLUDING MRCP) TECHNIQUE: Multiplanar multisequence MR imaging of the abdomen was performed both before and after the administration of intravenous contrast. Heavily T2-weighted images of the biliary and pancreatic ducts were obtained, and three-dimensional MRCP images were rendered by post processing. CONTRAST:  9mL GADAVIST  GADOBUTROL  1 MMOL/ML IV SOLN COMPARISON:  12/12/2020 FINDINGS: Lower chest: No acute findings. Hepatobiliary: No hepatic masses identified. Tiny gallstones and gallbladder sludge noted. Gallbladder is distended and shows mild diffuse wall thickening, consistent  with acute cholecystitis. Mild diffuse biliary ductal dilatation is seen with common bile duct measuring up to 7 mm. Abrupt termination of mid common bile duct is seen just superior to the pancreatic head, suspicious for stricture. No definite choledocholithiasis. Pancreas: No evidence of pancreatic mass or acute pancreatitis. Mild diffuse pancreatic ductal dilatation is seen with ductal irregularity, suspicious for chronic pancreatitis. Spleen:  Within normal limits in size and appearance. Adrenals/Urinary Tract: No suspicious masses identified. No evidence of hydronephrosis.  Stomach/Bowel: Large hiatal hernia is seen. Severe colonic diverticulosis, without signs of diverticulitis involving the visualized portions. Vascular/Lymphatic: No pathologically enlarged lymph nodes identified. 3.1 cm infrarenal abdominal aortic aneurysm. Other:  None. Musculoskeletal:  No suspicious bone lesions identified. IMPRESSION: Findings consistent with acute cholecystitis. Mild diffuse biliary ductal dilatation, with abrupt termination of the mid common bile duct just superior to the pancreatic head, suspicious for stricture. No definite choledocholithiasis. Mild diffuse pancreatic ductal dilatation with ductal irregularity, suspicious for chronic pancreatitis. No No evidence of pancreatic mass. Large hiatal hernia. Severe colonic diverticulosis. 3.1 cm infrarenal abdominal aortic aneurysm. Recommend follow-up ultrasound every 3 years. (Ref.: J Vasc Surg. 2018; 67:2-77 and J Am Coll Radiol 2013;10(10):789-794.) Electronically Signed   By: Marlyce Sine M.D.   On: 07/20/2023 11:56   US  ABDOMEN LIMITED RUQ (LIVER/GB) Result Date: 07/20/2023 CLINICAL DATA:  [id number] Transaminitis [id number] EXAM: ULTRASOUND ABDOMEN LIMITED RIGHT UPPER QUADRANT COMPARISON:  March 19, 2023, February 14, 2023 FINDINGS: Gallbladder: Multiple cholelithiasis and layering sludge. No wall thickening visualized when remeasured; wall thickness appears the same compared to prior ultrasound. No sonographic Murphy sign noted by sonographer. Common bile duct: Diameter: Visualized portion measures 4 mm, within normal limits. Liver: No focal lesion identified. Within normal limits in parenchymal echogenicity. Portal vein is patent on color Doppler imaging with normal direction of blood flow towards the liver. Other: None. IMPRESSION: Cholelithiasis without sonographic evidence of acute cholecystitis. Electronically Signed   By: Clancy Crimes M.D.   On: 07/20/2023 08:18    Anti-infectives: Anti-infectives (From admission, onward)     Start     Dose/Rate Route Frequency Ordered Stop   07/20/23 1745  cefTRIAXone (ROCEPHIN) 2 g in sodium chloride  0.9 % 100 mL IVPB        2 g 200 mL/hr over 30 Minutes Intravenous Every 24 hours 07/20/23 1645     07/20/23 1745  metroNIDAZOLE (FLAGYL) IVPB 500 mg        500 mg 100 mL/hr over 60 Minutes Intravenous Every 12 hours 07/20/23 1645          Assessment/Plan Cholelithiasis, cholangitis, biliary stricture  FEN - CLD,  VTE - SCD's ID -Flagyl and Rocephin Foley -none Plan - No stones were found in common bile duct which makes it more concerning as a biliary stricture without stone is unusual.  Lipase and LFTs are trending down, white count down to 18.4 from 27.5 yesterday (6/14).  Will continue monitoring.  Continue a.m. labs.  Advised patient that surgery will most likely be delayed pending results of cytology.  I reviewed nursing notes, Consultant (GI) notes, hospitalist notes, last 24 h vitals and pain scores, last 48 h intake and output, last 24 h labs and trends, and last 24 h imaging results.  This care required moderate level of medical decision making.    LOS: 2 days    Mirta Ammon, [facility] 07/22/2023, 7:23 AM Please see Amion for pager number during day hours 7:00am-4:30pm

## 2023-07-22 NOTE — Progress Notes (Signed)
 Dr. [facility] paged regarding bp.  Home bp medication has not been resumed.    07/22/23 0255  Vitals  Temp 97.7 F (36.5 C)  Temp Source Oral  BP (!) 163/117  MAP (mmHg) 132  BP Location Left Arm  BP Method Automatic  Patient Position (if appropriate) Lying  Pulse Rate 98  Pulse Rate Source Monitor  ECG Heart Rate 99  Resp 20

## 2023-07-22 NOTE — Plan of Care (Signed)
  Problem: Health Behavior/Discharge Planning: Goal: Ability to manage health-related needs will improve Outcome: Progressing   Problem: Clinical Measurements: Goal: Ability to maintain clinical measurements within normal limits will improve Outcome: Progressing Goal: Respiratory complications will improve Outcome: Progressing Goal: Cardiovascular complication will be avoided Outcome: Progressing   Problem: Nutrition: Goal: Adequate nutrition will be maintained Outcome: Progressing   Problem: Elimination: Goal: Will not experience complications related to bowel motility Outcome: Progressing Goal: Will not experience complications related to urinary retention Outcome: Progressing   Problem: Pain Managment: Goal: General experience of comfort will improve and/or be controlled Outcome: Progressing   Problem: Safety: Goal: Ability to remain free from injury will improve Outcome: Progressing   Problem: Skin Integrity: Goal: Risk for impaired skin integrity will decrease Outcome: Progressing

## 2023-07-22 NOTE — Progress Notes (Signed)
 Mobility Specialist Progress Note:    07/22/23 1019  Mobility  Activity Transferred from bed to chair;Ambulated with assistance to bathroom  Level of Assistance Standby assist, set-up cues, supervision of patient - no hands on  Assistive Device None  Distance Ambulated (ft) 6 ft  Activity Response Tolerated well  Mobility Referral Yes  Mobility visit 1 Mobility  Mobility Specialist Start Time (ACUTE ONLY) 0957  Mobility Specialist Stop Time (ACUTE ONLY) 1012  Mobility Specialist Time Calculation (min) (ACUTE ONLY) 15 min   Received pt in bed, agreeable to mobility w/ encouragement. Pt on 2L upon entry. Requested assistance to BR, BM successful. Required no physical assistance, SV. Ambulated on 2L O2. Transferred pt safely to chair. VSS throughout. Pt left in chair. Personal belongings and call light within reach. All needs met. Wife present.  Doak Free Mobility Specialist  Please contact via [employer] or  Rehab Office [phone]

## 2023-07-22 NOTE — Progress Notes (Addendum)
 Patient ID: Kyle Flock., male   DOB: [birth year], 83 y.o.   MRN: [id number]     Attending physician's note   I have taken a history, reviewed the chart, and examined the patient. I performed a substantive portion of this encounter, including complete performance of at least one of the key components, in conjunction with the APP. I agree with the APP's note, impression, and recommendations with my edits.   Liver enzymes trending.  WBC continues to improve.  Cytology pending.  States he otherwise feels well.  Advancing diet.  Kiyana Vazguez, DO, FACG [phone] office          Progress Note   Subjective   Day # 2 CC; weakness, fever and elevated LFTs and patient with history of Crohn's disease, COPD, prior diverticular hemorrhage, myasthenia gravis  IV Flagyl/Rocephin  MRCP 07/20/2023-consistent with acute cholecystitis, mild diffuse biliary ductal dilation with abrupt termination of the mid common bile duct just.  To the pancreatic head suspicious for stricture no definite choledocholithiasis, mild diffuse pancreatic ductal dilation with ductal irregularity suspicious for chronic pancreatitis, large hiatal hernia, severe colonic diverticulosis, 3.1 cm AAA  ERCP yesterday-single moderate common bile duct stenosis 3 mm in length, sphincterotomy was done, balloon sweeping with pus coming from the duct, 7 [place] by 5 cm plastic stent placed, cytology brushings done  Labs today-WBC 18.4/hemoglobin 11.4/hematocrit 35.1 Potassium 3.9/BUN 14/creatinine 1.0 T. bili 3.8/alk phos 180/AST 106/ALT 160     Objective   Vital signs in last 24 hours: Temp:  [97.6 F (36.4 C)-98 F (36.7 C)] 97.6 F (36.4 C) (06/16 0721) Pulse Rate:  [71-100] 71 (06/16 0721) Resp:  [16-20] 19 (06/16 0721) BP: (136-179)/(79-117) 175/96 (06/16 0843) SpO2:  [93 %-96 %] 96 % (06/16 0721) Last BM Date : 07/20/23 General:    white male in NAD Heart:  Regular rate and rhythm; no murmurs Lungs:  Respirations even and unlabored, lungs CTA bilaterally Abdomen:  Soft, nontender and nondistended. Normal bowel sounds. Extremities:  Without edema. Neurologic:  Alert and oriented,  grossly normal neurologically. Psych:  Cooperative. Normal mood and affect.  Intake/Output from previous day: 06/15 0701 - 06/16 0700 In: 2470.8 [I.V.:1829.6; IV Piggyback:641.2] Out: -  Intake/Output this shift: No intake/output data recorded.  Lab Results: Recent Labs    07/20/23 1732 07/21/23 1152 07/22/23 0303  WBC 27.5* 19.0* 18.4*  HGB 12.1* 11.8* 11.4*  HCT 36.3* 35.7* 35.1*  PLT 209 174 173   BMET Recent Labs    07/20/23 1732 07/21/23 1152 07/22/23 0303  NA 137 136 136  K 4.4 4.2 3.9  CL 105 104 107  CO2 22 22 21*  GLUCOSE 111* 144* 127*  BUN 12 11 14   CREATININE 1.16 0.94 1.00  CALCIUM 8.5* 8.3* 8.2*   LFT Recent Labs    07/22/23 0303  PROT 6.2*  ALBUMIN 2.6*  AST 106*  ALT 160*  ALKPHOS 180*  BILITOT 3.8*   PT/INR No results for input(s): LABPROT, INR in the last 72 hours.  Studies/Results: DG ERCP Result Date: 07/21/2023 CLINICAL DATA:  Acute cholecystitis, biliary duct stricture EXAM: ERCP TECHNIQUE: Multiple spot images obtained with the fluoroscopic device and submitted for interpretation post-procedure. FLUOROSCOPY: Radiation Exposure Index (as provided by the fluoroscopic device): 62.354 mGy Kerma COMPARISON:  MRCP 07/20/2023 FINDINGS: A total of 4 intraoperative saved images are submitted for review. The images demonstrate a flexible duodenal scope in the descending duodenum followed by wire cannulation of the common bile duct. Cholangiography is  then performed. Mild intra and extrahepatic biliary ductal dilatation. The cystic duct is patent. The final image demonstrates placement of a plastic biliary stent. IMPRESSION: 1. Mild intra and extrahepatic biliary ductal dilatation with stenosis/occlusion in the mid common bile duct. 2. The cystic duct appears patent.  3. Placement of a plastic biliary stent. These images were submitted for radiologic interpretation only. Please see the procedural report for the amount of contrast and the fluoroscopy time utilized. Electronically Signed   By: Fernando Hoyer M.D.   On: 07/21/2023 11:45   MR ABDOMEN MRCP W WO CONTAST Result Date: 07/20/2023 CLINICAL DATA:  Sepsis.  Elevated bilirubin and transaminitis. EXAM: MRI ABDOMEN WITHOUT AND WITH CONTRAST (INCLUDING MRCP) TECHNIQUE: Multiplanar multisequence MR imaging of the abdomen was performed both before and after the administration of intravenous contrast. Heavily T2-weighted images of the biliary and pancreatic ducts were obtained, and three-dimensional MRCP images were rendered by post processing. CONTRAST:  9mL GADAVIST  GADOBUTROL  1 MMOL/ML IV SOLN COMPARISON:  12/12/2020 FINDINGS: Lower chest: No acute findings. Hepatobiliary: No hepatic masses identified. Tiny gallstones and gallbladder sludge noted. Gallbladder is distended and shows mild diffuse wall thickening, consistent with acute cholecystitis. Mild diffuse biliary ductal dilatation is seen with common bile duct measuring up to 7 mm. Abrupt termination of mid common bile duct is seen just superior to the pancreatic head, suspicious for stricture. No definite choledocholithiasis. Pancreas: No evidence of pancreatic mass or acute pancreatitis. Mild diffuse pancreatic ductal dilatation is seen with ductal irregularity, suspicious for chronic pancreatitis. Spleen:  Within normal limits in size and appearance. Adrenals/Urinary Tract: No suspicious masses identified. No evidence of hydronephrosis. Stomach/Bowel: Large hiatal hernia is seen. Severe colonic diverticulosis, without signs of diverticulitis involving the visualized portions. Vascular/Lymphatic: No pathologically enlarged lymph nodes identified. 3.1 cm infrarenal abdominal aortic aneurysm. Other:  None. Musculoskeletal:  No suspicious bone lesions identified.  IMPRESSION: Findings consistent with acute cholecystitis. Mild diffuse biliary ductal dilatation, with abrupt termination of the mid common bile duct just superior to the pancreatic head, suspicious for stricture. No definite choledocholithiasis. Mild diffuse pancreatic ductal dilatation with ductal irregularity, suspicious for chronic pancreatitis. No No evidence of pancreatic mass. Large hiatal hernia. Severe colonic diverticulosis. 3.1 cm infrarenal abdominal aortic aneurysm. Recommend follow-up ultrasound every 3 years. (Ref.: J Vasc Surg. 2018; 67:2-77 and J Am Coll Radiol 2013;10(10):789-794.) Electronically Signed   By: Marlyce Sine M.D.   On: 07/20/2023 11:56   MR 3D Recon At Scanner Result Date: 07/20/2023 CLINICAL DATA:  Sepsis.  Elevated bilirubin and transaminitis. EXAM: MRI ABDOMEN WITHOUT AND WITH CONTRAST (INCLUDING MRCP) TECHNIQUE: Multiplanar multisequence MR imaging of the abdomen was performed both before and after the administration of intravenous contrast. Heavily T2-weighted images of the biliary and pancreatic ducts were obtained, and three-dimensional MRCP images were rendered by post processing. CONTRAST:  9mL GADAVIST  GADOBUTROL  1 MMOL/ML IV SOLN COMPARISON:  12/12/2020 FINDINGS: Lower chest: No acute findings. Hepatobiliary: No hepatic masses identified. Tiny gallstones and gallbladder sludge noted. Gallbladder is distended and shows mild diffuse wall thickening, consistent with acute cholecystitis. Mild diffuse biliary ductal dilatation is seen with common bile duct measuring up to 7 mm. Abrupt termination of mid common bile duct is seen just superior to the pancreatic head, suspicious for stricture. No definite choledocholithiasis. Pancreas: No evidence of pancreatic mass or acute pancreatitis. Mild diffuse pancreatic ductal dilatation is seen with ductal irregularity, suspicious for chronic pancreatitis. Spleen:  Within normal limits in size and appearance. Adrenals/Urinary Tract: No  suspicious masses  identified. No evidence of hydronephrosis. Stomach/Bowel: Large hiatal hernia is seen. Severe colonic diverticulosis, without signs of diverticulitis involving the visualized portions. Vascular/Lymphatic: No pathologically enlarged lymph nodes identified. 3.1 cm infrarenal abdominal aortic aneurysm. Other:  None. Musculoskeletal:  No suspicious bone lesions identified. IMPRESSION: Findings consistent with acute cholecystitis. Mild diffuse biliary ductal dilatation, with abrupt termination of the mid common bile duct just superior to the pancreatic head, suspicious for stricture. No definite choledocholithiasis. Mild diffuse pancreatic ductal dilatation with ductal irregularity, suspicious for chronic pancreatitis. No No evidence of pancreatic mass. Large hiatal hernia. Severe colonic diverticulosis. 3.1 cm infrarenal abdominal aortic aneurysm. Recommend follow-up ultrasound every 3 years. (Ref.: J Vasc Surg. 2018; 67:2-77 and J Am Coll Radiol 2013;10(10):789-794.) Electronically Signed   By: Marlyce Sine M.D.   On: 07/20/2023 11:56       Assessment / Plan:    #21  83 year old white male admitted with abdominal pain, fever, nausea and vomiting-workup consistent with sepsis and concerns for acute cholecystitis and cholangitis.  Transferred here from [facility] hospital.  MRI/MRCP shows cholelithiasis, acute cholecystitis and mild diffuse biliary ductal dilation with abrupt termination at the mid common bile duct superior to the pancreatic head suspicious for stricture, and mild diffuse pancreatic ductal dilation and ductal irregularity concerning for chronic pancreatitis  ERCP yesterday with finding of short distal common bile duct stricture, benign-appearing, brushings were done and temporary plastic stent was placed.  Patient did have pus coming from the duct at the time of procedure  Blood cultures no growth so far Continues on IV Rocephin and metronidazole  Patient doing  well postprocedure Surgery following for potential lap chole pending brushings  Plan; advance to regular diet today Await cytology Continue IV antibiotics Follow-up labs in a.m.       Principal Problem:   Severe sepsis (HCC) Active Problems:   ACID REFLUX DISEASE   Crohn's disease (HCC)   Ocular myasthenia (HCC)   Benign essential hypertension   Chronic obstructive pulmonary disease (HCC)   Gallbladder calculus with acute cholecystitis   Biliary stricture   Acute cholangitis     LOS: 2 days   Amy Esterwood PA-C 07/22/2023, 10:35 AM

## 2023-07-22 NOTE — Plan of Care (Signed)

## 2023-07-22 NOTE — Progress Notes (Signed)
 PROGRESS NOTE  Kyle GOLAB Sr. WUJ:[id number] DOB: 05/28/1940   PCP: Nestor Banter, MD  Patient is from: Home through [facility].  Independently ambulates at baseline.  DOA: 07/20/2023 LOS: 2  Chief complaints No chief complaint on file.    Brief Narrative / Interim history: 83 year old M with PMH of Crohn's disease, myasthenia gravis, esophageal stricture, diaphragmatic hernia, GERD, COPD, BPH and hard of hearing presented to [facility] ED with acute lethargy, weakness, upper abdominal pain, subjective fever, nausea and spitting up, and admitted with severe sepsis in the setting of acute calculus cholecystitis and acute cholangitis.  Initially presented to [facility].  Had elevated LFTs, hyperbilirubinemia.  RUQ US  concerning for gallstone.  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  Blood culture obtained.Received IV fluid and started on IV Zosyn, and he was transferred to [facility] for ERCP since there is no availability of ERCP over the weekend at  [facility].  Blood cultures NGTD.  Had ERCP with biliary sphincterectomy and plastic stent to CBD.  Biliary tree was swept and pus was found.  Cell cytology obtained.  General surgery consulted for lap chole but likes to wait on cytology results first.  Remains on ceftriaxone and Flagyl.  Improving.   Subjective: Seen and examined earlier this morning.  No major events overnight of this morning.  No complaints.  Objective: Vitals:   07/22/23 0351 07/22/23 0721 07/22/23 0843 07/22/23 1114  BP: (!) 154/92 (!) 179/91 (!) 175/96 (!) 148/80  Pulse: 87 71  69  Resp: 18 19  20   Temp:  97.6 F (36.4 C)  97.6 F (36.4 C)  TempSrc:  Oral  Axillary   SpO2: 96% 96%  93%  Weight:      Height:        Examination:  GENERAL: No apparent distress.  Nontoxic. HEENT: MMM.  Vision and hearing grossly intact.  NECK: Supple.  No apparent JVD.  RESP:  No IWOB.  Fair aeration bilaterally. CVS:  RRR. Heart sounds normal.  ABD/GI/GU: BS+. Abd soft, NTND.  MSK/EXT:  Moves extremities. No apparent deformity. No edema.  SKIN: no apparent skin lesion or wound NEURO: AA.  Oriented appropriately.  No apparent focal neuro deficit. PSYCH: Calm. Normal affect.   Consultants:  Gastroenterology General Surgery  Procedures: 6/15-ERCP  Microbiology summarized: 6/14-blood culture at [facility] NGTD  Assessment and plan: Severe sepsis due to acute calculus cholecystitis and acute cholangitis: Her tachycardia, tachypnea, leukocytosis and lactic acidosis at [facility].  Currently tachypneic to 27.  BP stable.  RUQ US , MRCP and ERCP finding as above.  Lactic acidosis removed.  Liver enzymes and leukocytosis improved. -Continue IV ceftriaxone and Flagyl -Follow-up biliary duct cell cytology -General Surgery consulted for lap chole but would like to wait on cytology result  Acute calculus cholecystitis: Antibiotics and general surgery input as above Common bile duct stricture: s/p ERCP and plastic stent to common bile duct stricture Elevated liver enzymes/hyperbilirubinemia: Improved -Management as above - Follow cytology from ERCP - Monitor LFT  Essential hypertension: Elevated BP -Resume home atenolol - P.o. hydralazine  25 mg every 8 hours - IV hydralazine  as needed   History of chron's disease: Stable - Holding home Colazal  in the setting of infection.  Discussed with GI.   Myasthenia gravis: Stable. -  Continue home Mestinon  - Hold methotrexate  in the setting of infection.   Elevated troponin/demand ischemia in the setting of severe sepsis - Manage sepsis as above   Hiatal hernia/GERD - Protonix .   History of COPD: Stable - As needed  bronchodilators.   Anion gap metabolic acidosis: Likely due to lactic acidosis.  Resolved.   BPH without LUTS -Continue Proscar  and Flomax .   Hyponatremia:Resolved.  Hypokalemia -Monitor replenish K and Mg as appropriate  Leukocytosis/bandemia: Due to #1.  Improving.  Body mass index is 29.84 kg/m.           DVT prophylaxis:  SCDs Start: 07/20/23 1727  Code Status: Full code Family Communication: Updated patient's wife at bedside. Level of care: Telemetry Medical Status is: Inpatient Remains inpatient appropriate because: Severe sepsis, acute calculus cholecystitis, cholangitis and biliary stricture   Final disposition: Likely home once medically stable   55 minutes with more than 50% spent in reviewing records, counseling patient/family and coordinating care.   Sch Meds:  Scheduled Meds:  atenolol  25 mg Oral Daily   finasteride   5 mg Oral Daily   hydrALAZINE   25 mg Oral Q8H   pantoprazole  (PROTONIX ) IV  40 mg Intravenous Q24H   prednisoLONE acetate  1 drop Left Eye QHS   pyridostigmine   60 mg Oral BID   tamsulosin   0.4 mg Oral Daily   Continuous Infusions:  cefTRIAXone (ROCEPHIN)  IV Stopped (07/21/23 1847)   metronidazole 500 mg (07/22/23 0628)   PRN Meds:.albuterol , fentaNYL  (SUBLIMAZE ) injection, hydrALAZINE , ondansetron  **OR** ondansetron  (ZOFRAN ) IV, oxyCODONE   Antimicrobials: Anti-infectives (From admission, onward)    Start     Dose/Rate Route Frequency Ordered Stop   07/20/23 1745  cefTRIAXone (ROCEPHIN) 2 g in sodium chloride  0.9 % 100 mL IVPB        2 g 200 mL/hr over 30 Minutes Intravenous Every 24 hours 07/20/23 1645     07/20/23 1745  metroNIDAZOLE (FLAGYL) IVPB 500 mg        500 mg 100 mL/hr over 60 Minutes Intravenous Every 12 hours 07/20/23 1645          I have personally reviewed the following labs and images: CBC: Recent Labs  Lab 07/20/23 0629 07/20/23 1732 07/21/23 1152 07/22/23 0303  WBC 16.5* 27.5* 19.0* 18.4*   NEUTROABS 15.7* 25.9*  --   --   HGB 13.8 12.1* 11.8* 11.4*  HCT 41.5 36.3* 35.7* 35.1*  MCV 89.1 89.2 89.5 88.6  PLT 185 209 174 173   BMP &GFR Recent Labs  Lab 07/20/23 0629 07/20/23 1732 07/21/23 1152 07/22/23 0303  NA 134* 137 136 136  K 3.4* 4.4 4.2 3.9  CL 103 105 104 107  CO2 17* 22 22 21*  GLUCOSE 129* 111* 144* 127*  BUN 12 12 11 14   CREATININE 0.98 1.16 0.94 1.00  CALCIUM 8.5* 8.5* 8.3* 8.2*  MG  --  1.6* 1.8 1.9   Estimated Creatinine Clearance: 64.5 mL/min (by C-G formula based on SCr of 1 mg/dL). Liver & Pancreas: Recent Labs  Lab 07/20/23 0629 07/20/23 1732 07/21/23 1152 07/22/23 0303  AST 252* 165* 115* 106*  ALT 285* 235* 185* 160*  ALKPHOS 223* 191* 162* 180*  BILITOT 6.6* 6.3* 5.2* 3.8*  PROT 6.9 6.5 6.5 6.2*  ALBUMIN 3.4* 2.9* 2.9* 2.6*   Recent Labs  Lab 07/20/23 0805 07/20/23 1732 07/21/23 1152  LIPASE 40 32 42   No results for input(s): AMMONIA in the last 168 hours. Diabetic: No results for input(s):  HGBA1C in the last 72 hours. No results for input(s): GLUCAP in the last 168 hours. Cardiac Enzymes: No results for input(s): CKTOTAL, CKMB, CKMBINDEX, TROPONINI in the last 168 hours. No results for input(s): PROBNP in the last 8760 hours. Coagulation Profile: No results for input(s): INR, PROTIME in the last 168 hours. Thyroid  Function Tests: No results for input(s): TSH, T4TOTAL, FREET4, T3FREE, THYROIDAB in the last 72 hours. Lipid Profile: No results for input(s): CHOL, HDL, LDLCALC, TRIG, CHOLHDL, LDLDIRECT in the last 72 hours. Anemia Panel: No results for input(s): VITAMINB12, FOLATE, FERRITIN, TIBC, IRON , RETICCTPCT in the last 72 hours. Urine analysis:    Component Value Date/Time   COLORURINE AMBER (A) 07/20/2023 1251   APPEARANCEUR CLEAR (A) 07/20/2023 1251   LABSPEC 1.019 07/20/2023 1251   PHURINE 6.0 07/20/2023 1251   GLUCOSEU NEGATIVE 07/20/2023 1251   HGBUR  NEGATIVE 07/20/2023 1251   BILIRUBINUR NEGATIVE 07/20/2023 1251   KETONESUR NEGATIVE 07/20/2023 1251   PROTEINUR 30 (A) 07/20/2023 1251   NITRITE NEGATIVE 07/20/2023 1251   LEUKOCYTESUR NEGATIVE 07/20/2023 1251   Sepsis Labs: Invalid input(s): PROCALCITONIN, LACTICIDVEN  Microbiology: Recent Results (from the past 240 hours)  Blood culture (routine x 2)     Status: None (Preliminary result)   Collection Time: 07/20/23  8:05 AM   Specimen: BLOOD  Result Value Ref Range Status   Specimen Description BLOOD LEFT ANTECUBITAL  Final   Special Requests   Final    BOTTLES DRAWN AEROBIC AND ANAEROBIC Blood Culture adequate volume   Culture   Final    NO GROWTH 2 DAYS Performed at [facility], [address] [place], [place] [address]    Report Status PENDING  Incomplete  MRSA Next Gen by PCR, Nasal     Status: None   Collection Time: 07/21/23  6:27 AM   Specimen: Nasal Mucosa; Nasal Swab  Result Value Ref Range Status   MRSA by PCR Next Gen NOT DETECTED NOT DETECTED Final    Comment: (NOTE) The GeneXpert MRSA Assay (FDA approved for NASAL specimens only), is one component of a comprehensive MRSA colonization surveillance program. It is not intended to diagnose MRSA infection nor to guide or monitor treatment for MRSA infections. Test performance is not FDA approved in patients less than 72 years old. Performed at [facility], [address], [place], [place] [address]     Radiology Studies: No results found.     Collier Bohnet T. [facility]  If 7PM-7AM, please contact night-coverage [email or web address] 07/22/2023, 11:25 AM

## 2023-07-23 DIAGNOSIS — K8309 Other cholangitis: Secondary | ICD-10-CM

## 2023-07-23 DIAGNOSIS — K8001 Calculus of gallbladder with acute cholecystitis with obstruction: Secondary | ICD-10-CM | POA: Diagnosis not present

## 2023-07-23 DIAGNOSIS — A419 Sepsis, unspecified organism: Secondary | ICD-10-CM | POA: Diagnosis not present

## 2023-07-23 DIAGNOSIS — R6520 Severe sepsis without septic shock: Secondary | ICD-10-CM | POA: Diagnosis not present

## 2023-07-23 LAB — CBC
HCT: 35.3 % — ABNORMAL LOW (ref 39.0–52.0)
Hemoglobin: 11.7 g/dL — ABNORMAL LOW (ref 13.0–17.0)
MCH: 29 pg (ref 26.0–34.0)
MCHC: 33.1 g/dL (ref 30.0–36.0)
MCV: 87.6 fL (ref 80.0–100.0)
Platelets: 197 10*3/uL (ref 150–400)
RBC: 4.03 MIL/uL — ABNORMAL LOW (ref 4.22–5.81)
RDW: 26.2 % — ABNORMAL HIGH (ref 11.5–15.5)
WBC: 10.9 10*3/uL — ABNORMAL HIGH (ref 4.0–10.5)
nRBC: 0 % (ref 0.0–0.2)

## 2023-07-23 LAB — COMPREHENSIVE METABOLIC PANEL WITH GFR
ALT: 148 U/L — ABNORMAL HIGH (ref 0–44)
AST: 104 U/L — ABNORMAL HIGH (ref 15–41)
Albumin: 2.6 g/dL — ABNORMAL LOW (ref 3.5–5.0)
Alkaline Phosphatase: 210 U/L — ABNORMAL HIGH (ref 38–126)
Anion gap: 9 (ref 5–15)
BUN: 12 mg/dL (ref 8–23)
CO2: 21 mmol/L — ABNORMAL LOW (ref 22–32)
Calcium: 7.8 mg/dL — ABNORMAL LOW (ref 8.9–10.3)
Chloride: 106 mmol/L (ref 98–111)
Creatinine, Ser: 0.83 mg/dL (ref 0.61–1.24)
GFR, Estimated: >60 mL/min (ref >60)
Glucose, Bld: 84 mg/dL (ref 70–99)
Potassium: 3.5 mmol/L (ref 3.5–5.1)
Sodium: 136 mmol/L (ref 135–145)
Total Bilirubin: 2.9 mg/dL — ABNORMAL HIGH (ref 0.0–1.2)
Total Protein: 6 g/dL — ABNORMAL LOW (ref 6.5–8.1)

## 2023-07-23 LAB — MAGNESIUM: Magnesium: 1.9 mg/dL (ref 1.7–2.4)

## 2023-07-23 LAB — CYTOLOGY - NON PAP

## 2023-07-23 MED ORDER — CHLORHEXIDINE GLUCONATE CLOTH 2 % EX PADS
6.0000 | MEDICATED_PAD | Freq: Every day | CUTANEOUS | Status: DC
  Administered 2023-07-23: 6 via TOPICAL

## 2023-07-23 MED ORDER — CHLORHEXIDINE GLUCONATE CLOTH 2 % EX PADS
6.0000 | MEDICATED_PAD | Freq: Every day | CUTANEOUS | Status: DC
  Administered 2023-07-24 – 2023-07-26 (×2): 6 via TOPICAL

## 2023-07-23 MED ORDER — SODIUM CHLORIDE 0.9% FLUSH
10.0000 mL | Freq: Two times a day (BID) | INTRAVENOUS | Status: DC
  Administered 2023-07-23 – 2023-07-24 (×2): 10 mL
  Administered 2023-07-24 – 2023-07-26 (×2): 40 mL

## 2023-07-23 MED ORDER — HYDRALAZINE HCL 50 MG PO TABS
50.0000 mg | ORAL_TABLET | Freq: Three times a day (TID) | ORAL | Status: DC
  Administered 2023-07-23 – 2023-07-26 (×6): 50 mg via ORAL
  Filled 2023-07-23 (×8): qty 1

## 2023-07-23 MED ORDER — POTASSIUM CHLORIDE CRYS ER 20 MEQ PO TBCR
40.0000 meq | EXTENDED_RELEASE_TABLET | Freq: Once | ORAL | Status: AC
  Administered 2023-07-23: 40 meq via ORAL
  Filled 2023-07-23: qty 2

## 2023-07-23 NOTE — Plan of Care (Signed)
  Problem: Education: Goal: Knowledge of General Education information will improve Description: Including pain rating scale, medication(s)/side effects and non-pharmacologic comfort measures Outcome: Progressing   Problem: Health Behavior/Discharge Planning: Goal: Ability to manage health-related needs will improve Outcome: Progressing   Problem: Clinical Measurements: Goal: Ability to maintain clinical measurements within normal limits will improve Outcome: Progressing Goal: Respiratory complications will improve Outcome: Progressing Goal: Cardiovascular complication will be avoided Outcome: Progressing   Problem: Elimination: Goal: Will not experience complications related to bowel motility Outcome: Progressing Goal: Will not experience complications related to urinary retention Outcome: Progressing   Problem: Pain Managment: Goal: General experience of comfort will improve and/or be controlled Outcome: Progressing

## 2023-07-23 NOTE — Progress Notes (Signed)

## 2023-07-23 NOTE — Care Management Important Message (Signed)
 Important Message  Patient Details  Name: Kyle MAHL Sr. MRN: [id number] Date of Birth: [birth year]   Important Message Given:  Yes - Medicare IM     Wynonia Hedges 07/23/2023, 3:18 PM

## 2023-07-23 NOTE — Progress Notes (Signed)
 PROGRESS NOTE  Kyle BRAZEL Sr. WUJ:[id number] DOB: 03/22/1940   PCP: Nestor Banter, MD  Patient is from: Home through [facility].  Independently ambulates at baseline.  DOA: 07/20/2023 LOS: 3  Chief complaints No chief complaint on file.    Brief Narrative / Interim history: 83 year old M with PMH of Crohn's disease, myasthenia gravis, esophageal stricture, diaphragmatic hernia, GERD, COPD, BPH and hard of hearing presented to [facility] ED with acute lethargy, weakness, upper abdominal pain, subjective fever, nausea and spitting up, and admitted with severe sepsis in the setting of acute calculus cholecystitis and acute cholangitis.  Initially presented to [facility].  Had elevated LFTs, hyperbilirubinemia.  RUQ US  concerning for gallstone.  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  Blood culture obtained.Received IV fluid and started on IV Zosyn, and he was transferred to [facility] for ERCP since there is no availability of ERCP over the weekend at [facility].  Blood cultures NGTD.  Had ERCP with biliary sphincterectomy and plastic stent to CBD.  Biliary tree was swept and pus was found.  Cell cytology obtained.  Pathology showed atypical cells favoring reactive inflammatory change.  General surgery and yet to decide lap chole.  Sepsis physiology resolving on ceftriaxone and Flagyl.     Subjective: Seen and examined earlier this morning.  No major events overnight of this morning.  No complaints.  Daughter and wife at bedside.  Objective: Vitals:   07/23/23 0442 07/23/23 0718 07/23/23 0938 07/23/23 1135  BP: (!) 169/96 (!) 172/98 (!) 160/95 (!) 172/102  Pulse: 67 67 77 72  Resp: 17 16  20    Temp: 98.1 F (36.7 C) 98 F (36.7 C)  97.6 F (36.4 C)  TempSrc: Oral Oral  Oral  SpO2: 90% 90%  94%  Weight:      Height:        Examination:  GENERAL: No apparent distress.  Nontoxic. HEENT: MMM.  Vision and hearing grossly intact.  NECK: Supple.  No apparent JVD.  RESP:  No IWOB.  Fair aeration bilaterally. CVS:  RRR. Heart sounds normal.  ABD/GI/GU: BS+. Abd soft, NTND.  MSK/EXT:  Moves extremities. No apparent deformity. No edema.  SKIN: no apparent skin lesion or wound NEURO: AA.  Oriented appropriately.  No apparent focal neuro deficit. PSYCH: Calm. Normal affect.   Consultants:  Gastroenterology General Surgery  Procedures: 6/15-ERCP  Microbiology summarized: 6/14-blood culture at [facility] NGTD  Assessment and plan: Severe sepsis due to acute calculus cholecystitis and acute cholangitis: Her tachycardia, tachypnea, leukocytosis and lactic acidosis at [facility].  Currently tachypneic to 27.  BP stable.  RUQ US , MRCP and ERCP finding as above.  Cytology with atypical cells favoring reactive inflammatory changes.  Lactic acidosis resolved.  Leukocytosis resolving.  Liver enzymes improved. -Continue IV ceftriaxone and Flagyl -General Surgery yet to decide lap chole  Acute calculus cholecystitis: Antibiotics and general surgery input as above Common bile duct stricture: s/p ERCP and plastic stent to common bile duct stricture Elevated liver enzymes/hyperbilirubinemia: Improved -Management as above -Monitor LFT  Essential hypertension: BP remains elevated by adding hydralazine  -Continue home atenolol 25 mg daily.  I will not increase this given history of MG -Increase p.o. hydralazine  to 50 mg 3 times daily -IV hydralazine  as needed   History  of chron's disease: Stable - Holding home Colazal  in the setting of infection.  Discussed with GI.   Myasthenia gravis: Stable. - Continue home Mestinon  - Hold methotrexate  in the setting of infection.   Elevated  troponin/demand ischemia in the setting of severe sepsis - Manage sepsis as above   Hiatal hernia/GERD - Protonix .   History of COPD: Stable - As needed bronchodilators.   Anion gap metabolic acidosis: Likely due to lactic acidosis.  Resolved.   BPH without LUTS -Continue Proscar  and Flomax .   Hyponatremia:Resolved.  Hypokalemia -Monitor replenish K and Mg as appropriate  Leukocytosis/bandemia: Due to #1.  Resolving.  Body mass index is 29.84 kg/m.           DVT prophylaxis:  SCDs Start: 07/20/23 1727  Code Status: Full code Family Communication: Updated patient's wife and daughter at bedside. Level of care: Telemetry Medical Status is: Inpatient Remains inpatient appropriate because: Severe sepsis, acute calculus cholecystitis, cholangitis and biliary stricture   Final disposition: Likely home once medically stable   55 minutes with more than 50% spent in reviewing records, counseling patient/family and coordinating care.   Sch Meds:  Scheduled Meds:  atenolol  25 mg Oral Daily   finasteride   5 mg Oral Daily   hydrALAZINE   50 mg Oral Q8H   pantoprazole   40 mg Oral Daily   prednisoLONE acetate  1 drop Left Eye QHS   pyridostigmine   60 mg Oral BID   tamsulosin   0.4 mg Oral Daily   Continuous Infusions:  cefTRIAXone (ROCEPHIN)  IV Stopped (07/22/23 1842)   metronidazole 500 mg (07/23/23 0616)   PRN Meds:.albuterol , fentaNYL  (SUBLIMAZE ) injection, hydrALAZINE , ondansetron  **OR** ondansetron  (ZOFRAN ) IV, oxyCODONE   Antimicrobials: Anti-infectives (From admission, onward)    Start     Dose/Rate Route Frequency Ordered Stop   07/20/23 1745  cefTRIAXone (ROCEPHIN) 2 g in sodium chloride  0.9 % 100 mL IVPB        2 g 200 mL/hr over 30 Minutes Intravenous Every 24 hours 07/20/23 1645     07/20/23 1745  metroNIDAZOLE (FLAGYL) IVPB 500 mg        500 mg 100 mL/hr over 60 Minutes Intravenous Every 12 hours 07/20/23 1645          I have personally reviewed  the following labs and images: CBC: Recent Labs  Lab 07/20/23 0629 07/20/23 1732 07/21/23 1152 07/22/23 0303 07/23/23 0318  WBC 16.5* 27.5* 19.0* 18.4* 10.9*  NEUTROABS 15.7* 25.9*  --   --   --   HGB 13.8 12.1* 11.8* 11.4* 11.7*  HCT 41.5 36.3* 35.7* 35.1* 35.3*  MCV 89.1 89.2 89.5 88.6 87.6  PLT 185 209 174 173 197   BMP &GFR Recent Labs  Lab 07/20/23 0629 07/20/23 1732 07/21/23 1152 07/22/23 0303 07/23/23 0318  NA 134* 137 136 136 136  K 3.4* 4.4 4.2 3.9 3.5  CL 103 105 104 107 106  CO2 17* 22 22 21* 21*  GLUCOSE 129* 111* 144* 127* 84  BUN 12 12 11 14 12   CREATININE 0.98 1.16 0.94 1.00 0.83  CALCIUM 8.5* 8.5* 8.3* 8.2* 7.8*  MG  --  1.6* 1.8 1.9 1.9   Estimated Creatinine Clearance: 77.7 mL/min (by C-G formula based on SCr of 0.83 mg/dL). Liver & Pancreas: Recent Labs  Lab 07/20/23 0629 07/20/23 1732 07/21/23 1152 07/22/23 0303 07/23/23 0318  AST 252* 165* 115* 106* 104*  ALT 285* 235* 185* 160* 148*  ALKPHOS 223* 191* 162* 180* 210*  BILITOT 6.6*  6.3* 5.2* 3.8* 2.9*  PROT 6.9 6.5 6.5 6.2* 6.0*  ALBUMIN 3.4* 2.9* 2.9* 2.6* 2.6*   Recent Labs  Lab 07/20/23 0805 07/20/23 1732 07/21/23 1152  LIPASE 40 32 42   No results for input(s): AMMONIA in the last 168 hours. Diabetic: No results for input(s): HGBA1C in the last 72 hours. No results for input(s): GLUCAP in the last 168 hours. Cardiac Enzymes: No results for input(s): CKTOTAL, CKMB, CKMBINDEX, TROPONINI in the last 168 hours. No results for input(s): PROBNP in the last 8760 hours. Coagulation Profile: No results for input(s): INR, PROTIME in the last 168 hours. Thyroid  Function Tests: No results for input(s): TSH, T4TOTAL, FREET4, T3FREE, THYROIDAB in the last 72 hours. Lipid Profile: No results for input(s): CHOL, HDL, LDLCALC, TRIG, CHOLHDL, LDLDIRECT in the last 72 hours. Anemia Panel: No results for input(s): VITAMINB12, FOLATE, FERRITIN,  TIBC, IRON , RETICCTPCT in the last 72 hours. Urine analysis:    Component Value Date/Time   COLORURINE AMBER (A) 07/20/2023 1251   APPEARANCEUR CLEAR (A) 07/20/2023 1251   LABSPEC 1.019 07/20/2023 1251   PHURINE 6.0 07/20/2023 1251   GLUCOSEU NEGATIVE 07/20/2023 1251   HGBUR NEGATIVE 07/20/2023 1251   BILIRUBINUR NEGATIVE 07/20/2023 1251   KETONESUR NEGATIVE 07/20/2023 1251   PROTEINUR 30 (A) 07/20/2023 1251   NITRITE NEGATIVE 07/20/2023 1251   LEUKOCYTESUR NEGATIVE 07/20/2023 1251   Sepsis Labs: Invalid input(s): PROCALCITONIN, LACTICIDVEN  Microbiology: Recent Results (from the past 240 hours)  Blood culture (routine x 2)     Status: None (Preliminary result)   Collection Time: 07/20/23  8:05 AM   Specimen: BLOOD  Result Value Ref Range Status   Specimen Description BLOOD LEFT ANTECUBITAL  Final   Special Requests   Final    BOTTLES DRAWN AEROBIC AND ANAEROBIC Blood Culture adequate volume   Culture   Final    NO GROWTH 3 DAYS Performed at [facility], [address], [place], [place] [address]    Report Status PENDING  Incomplete  MRSA Next Gen by PCR, Nasal     Status: None   Collection Time: 07/21/23  6:27 AM   Specimen: Nasal Mucosa; Nasal Swab  Result Value Ref Range Status   MRSA by PCR Next Gen NOT DETECTED NOT DETECTED Final    Comment: (NOTE) The GeneXpert MRSA Assay (FDA approved for NASAL specimens only), is one component of a comprehensive MRSA colonization surveillance program. It is not intended to diagnose MRSA infection nor to guide or monitor treatment for MRSA infections. Test performance is not FDA approved in patients less than 75 years old. Performed at [facility], [address], [place], [place] [address]     Radiology Studies: No results found.     Rayvon Dakin T. Emmi Wertheim [facility]  If 7PM-7AM, please contact night-coverage [email or web address] 07/23/2023, 2:20 PM

## 2023-07-23 NOTE — Progress Notes (Addendum)
 2 Days Post-Op  Subjective: CC: Patient states he does not have any pain at this time, he had another bowel movement earlier this morning and is passing flatus.  He denies nausea or vomiting, tolerating food well.  Patient has a history of Crohn's but states that he has not had any recent flareups and that it has been well-controlled. Patient is hemodynamically stable and in no acute distress.  Objective: Vital signs in last 24 hours: Temp:  [97.6 F (36.4 C)-98.3 F (36.8 C)] 98 F (36.7 C) (06/17 0718) Pulse Rate:  [65-75] 67 (06/17 0718) Resp:  [16-20] 16 (06/17 0718) BP: (122-175)/(80-101) 172/98 (06/17 0718) SpO2:  [90 %-94 %] 90 % (06/17 0718) Last BM Date : 07/20/23  Intake/Output from previous day: 06/16 0701 - 06/17 0700 In: 300 [IV Piggyback:300] Out: 300 [Urine:300] Intake/Output this shift: No intake/output data recorded.  PE: Physical Exam Constitutional:      General: He is not in acute distress.    Appearance: He is not ill-appearing.   Cardiovascular:     Rate and Rhythm: Normal rate.  Pulmonary:     Effort: Pulmonary effort is normal. No respiratory distress.  Abdominal:     General: There is no distension.     Palpations: Abdomen is soft.     Tenderness: There is no abdominal tenderness. There is no guarding or rebound.   Skin:    General: Skin is warm and dry.   Neurological:     General: No focal deficit present.     Mental Status: He is alert and oriented to person, place, and time.     Lab Results:  Recent Labs    07/22/23 0303 07/23/23 0318  WBC 18.4* 10.9*  HGB 11.4* 11.7*  HCT 35.1* 35.3*  PLT 173 197   BMET Recent Labs    07/22/23 0303 07/23/23 0318  NA 136 136  K 3.9 3.5  CL 107 106  CO2 21* 21*  GLUCOSE 127* 84  BUN 14 12  CREATININE 1.00 0.83  CALCIUM 8.2* 7.8*   PT/INR No results for input(s): LABPROT, INR in the last 72 hours. CMP     Component Value Date/Time   NA 136 07/23/2023 0318   NA 137  06/17/2013 0548   K 3.5 07/23/2023 0318   K 4.3 06/17/2013 0548   CL 106 07/23/2023 0318   CL 110 (H) 06/17/2013 0548   CO2 21 (L) 07/23/2023 0318   CO2 21 06/17/2013 0548   GLUCOSE 84 07/23/2023 0318   GLUCOSE 147 (H) 06/17/2013 0548   BUN 12 07/23/2023 0318   BUN 17 06/17/2013 0548   CREATININE 0.83 07/23/2023 0318   CREATININE 0.94 06/17/2013 0548   CALCIUM 7.8 (L) 07/23/2023 0318   CALCIUM 8.4 (L) 06/17/2013 0548   PROT 6.0 (L) 07/23/2023 0318   PROT 8.1 06/16/2013 1923   ALBUMIN 2.6 (L) 07/23/2023 0318   ALBUMIN 3.7 06/16/2013 1923   AST 104 (H) 07/23/2023 0318   AST 14 (L) 06/16/2013 1923   ALT 148 (H) 07/23/2023 0318   ALT 22 06/16/2013 1923   ALKPHOS 210 (H) 07/23/2023 0318   ALKPHOS 62 06/16/2013 1923   BILITOT 2.9 (H) 07/23/2023 0318   BILITOT 0.6 06/16/2013 1923   GFRNONAA >60 07/23/2023 0318   GFRNONAA >60 06/17/2013 0548   GFRAA >60 01/02/2018 1011   GFRAA >60 06/17/2013 0548   Lipase     Component Value Date/Time   LIPASE 42 07/21/2023 1152    Studies/Results:  DG ERCP Result Date: 07/21/2023 CLINICAL DATA:  Acute cholecystitis, biliary duct stricture EXAM: ERCP TECHNIQUE: Multiple spot images obtained with the fluoroscopic device and submitted for interpretation post-procedure. FLUOROSCOPY: Radiation Exposure Index (as provided by the fluoroscopic device): 62.354 mGy Kerma COMPARISON:  MRCP 07/20/2023 FINDINGS: A total of 4 intraoperative saved images are submitted for review. The images demonstrate a flexible duodenal scope in the descending duodenum followed by wire cannulation of the common bile duct. Cholangiography is then performed. Mild intra and extrahepatic biliary ductal dilatation. The cystic duct is patent. The final image demonstrates placement of a plastic biliary stent. IMPRESSION: 1. Mild intra and extrahepatic biliary ductal dilatation with stenosis/occlusion in the mid common bile duct. 2. The cystic duct appears patent. 3. Placement of a  plastic biliary stent. These images were submitted for radiologic interpretation only. Please see the procedural report for the amount of contrast and the fluoroscopy time utilized. Electronically Signed   By: Fernando Hoyer M.D.   On: 07/21/2023 11:45    Anti-infectives: Anti-infectives (From admission, onward)    Start     Dose/Rate Route Frequency Ordered Stop   07/20/23 1745  cefTRIAXone (ROCEPHIN) 2 g in sodium chloride  0.9 % 100 mL IVPB        2 g 200 mL/hr over 30 Minutes Intravenous Every 24 hours 07/20/23 1645     07/20/23 1745  metroNIDAZOLE (FLAGYL) IVPB 500 mg        500 mg 100 mL/hr over 60 Minutes Intravenous Every 12 hours 07/20/23 1645          Assessment/Plan Cholelithiasis, cholangitis, biliary stricture  FEN - CLD,  VTE - SCD's ID -Flagyl and Rocephin Foley -none Plan - No stones were found in common bile duct which makes it more concerning as a biliary stricture without stone is unusual.  Lipase and LFTs are trending down, white count down to 10.9 from 18.4 yesterday (6/16).  Will continue monitoring.  Continue a.m. labs.  Advised patient that surgery will most likely be delayed pending results of cytology.  Cytology report still pending at this time.  I reviewed nursing notes, Consultant (GI) notes, hospitalist notes, last 24 h vitals and pain scores, last 48 h intake and output, last 24 h labs and trends, and last 24 h imaging results.  This care required moderate level of medical decision making.    LOS: 3 days    Mirta Ammon, [facility] 07/23/2023, 7:31 AM Please see Amion for pager number during day hours 7:00am-4:30pm   Addendum: cytology reviewed, discussed with GI and HPB. Plan to redo ercp with brushings/stent exchange in 8 weeks. Whipple would be needed for bile duct stricture excision. GB appears to be involved also. In discussion, plan to do lap chole now then follow up. Discussed might be able to do tomorrow.  I discussed  the procedure in detail.  We discussed the risks and benefits of a laparoscopic cholecystectomy and possible cholangiogram including, but not limited to bleeding, infection, injury to surrounding structures such as the intestine or liver, bile leak, retained gallstones, need to convert to an open procedure, prolonged diarrhea, blood clots such as  DVT, common bile duct injury, anesthesia risks, and possible need for additional procedures.  The likelihood of improvement in symptoms and return to the patient's normal status is good. We discussed the typical post-operative recovery course.

## 2023-07-23 NOTE — Plan of Care (Signed)

## 2023-07-23 NOTE — Progress Notes (Signed)
 [facility] ROUNDING NOTE   Subjective: No acute events overnight.  Feeling well and without any complaints today.  Liver enzymes are about stable from yesterday, but T. bili downtrending (2.9) and WBC downtrending (10.9).  Cytology results came back this morning and CBD brushings show atypical cells favoring reactive inflammatory change.  Discussed these results with patient along with his stepdaughter by phone today.   Objective: Vital signs in last 24 hours: Temp:  [97.6 F (36.4 C)-98.3 F (36.8 C)] 97.6 F (36.4 C) (06/17 1135) Pulse Rate:  [65-77] 72 (06/17 1135) Resp:  [16-20] 20 (06/17 1135) BP: (122-172)/(84-102) 172/102 (06/17 1135) SpO2:  [90 %-94 %] 94 % (06/17 1135) Last BM Date : 07/20/23 General: NAD Abdomen:  Soft, NT, ND, +BS Ext:  No c/c/e    Intake/Output from previous day: 06/16 0701 - 06/17 0700 In: 300 [IV Piggyback:300] Out: 300 [Urine:300] Intake/Output this shift: No intake/output data recorded.   Lab Results: Recent Labs    07/21/23 1152 07/22/23 0303 07/23/23 0318  WBC 19.0* 18.4* 10.9*  HGB 11.8* 11.4* 11.7*  PLT 174 173 197  MCV 89.5 88.6 87.6   BMET Recent Labs    07/21/23 1152 07/22/23 0303 07/23/23 0318  NA 136 136 136  K 4.2 3.9 3.5  CL 104 107 106  CO2 22 21* 21*  GLUCOSE 144* 127* 84  BUN 11 14 12   CREATININE 0.94 1.00 0.83  CALCIUM 8.3* 8.2* 7.8*   LFT Recent Labs    07/21/23 1152 07/22/23 0303 07/23/23 0318  PROT 6.5 6.2* 6.0*  ALBUMIN 2.9* 2.6* 2.6*  AST 115* 106* 104*  ALT 185* 160* 148*  ALKPHOS 162* 180* 210*  BILITOT 5.2* 3.8* 2.9*   PT/INR No results for input(s): INR in the last 72 hours.    Imaging/Other results: No results found.    Assessment and Plan:  1) CBD stricture 2) Acute cholecystitis with cholangitis 3) Cholelithiasis  83 year old white male admitted with abdominal pain, fever, nausea and vomiting-workup consistent with sepsis and concerns for acute  cholecystitis and cholangitis.  Transferred here from [facility].   MRI/MRCP shows cholelithiasis, acute cholecystitis and mild diffuse biliary ductal dilation with abrupt termination at the mid common bile duct superior to the pancreatic head suspicious for stricture, and mild diffuse pancreatic ductal dilation and ductal irregularity concerning for chronic pancreatitis   ERCP on 07/21/2023 with finding of short distal common bile duct stricture, benign-appearing, brushings were done and temporary plastic stent was placed.  Patient did have pus coming from the duct at the time of procedure.  Cytology shows atypical cells favoring reactive inflammatory change.   Blood cultures no growth so far  -Continue IV Rocephin and metronidazole per Surgical service and primary Hospital service - Will discuss case with advanced GI service regarding timing of repeat outpatient ERCP with stent retrieval and likely repeat brushings (will also need to coordinate whether that is done here or through his primary GI at [facility] GI clinic/[facility]) - Will discuss with consulting surgical service re: lap ccy   4) History of Crohn's disease No recent flares.  No active symptoms - Holding home Colazal  due to infection - Will follow-up with his primary GI at [facility] GI clinic as an outpatient    Annis Kinder, DO  07/23/2023, 12:00 PM [facility] Pager [phone]

## 2023-07-24 DIAGNOSIS — K5090 Crohn's disease, unspecified, without complications: Secondary | ICD-10-CM | POA: Diagnosis not present

## 2023-07-24 DIAGNOSIS — A419 Sepsis, unspecified organism: Secondary | ICD-10-CM | POA: Diagnosis not present

## 2023-07-24 DIAGNOSIS — K8001 Calculus of gallbladder with acute cholecystitis with obstruction: Secondary | ICD-10-CM | POA: Diagnosis not present

## 2023-07-24 DIAGNOSIS — R6520 Severe sepsis without septic shock: Secondary | ICD-10-CM | POA: Diagnosis not present

## 2023-07-24 LAB — CBC
HCT: 39.6 % (ref 39.0–52.0)
Hemoglobin: 13.4 g/dL (ref 13.0–17.0)
MCH: 29.7 pg (ref 26.0–34.0)
MCHC: 33.8 g/dL (ref 30.0–36.0)
MCV: 87.8 fL (ref 80.0–100.0)
Platelets: 220 10*3/uL (ref 150–400)
RBC: 4.51 MIL/uL (ref 4.22–5.81)
RDW: 26.3 % — ABNORMAL HIGH (ref 11.5–15.5)
WBC: 9 10*3/uL (ref 4.0–10.5)
nRBC: 0.2 % (ref 0.0–0.2)

## 2023-07-24 LAB — RENAL FUNCTION PANEL
Albumin: 2.8 g/dL — ABNORMAL LOW (ref 3.5–5.0)
Anion gap: 9 (ref 5–15)
BUN: 9 mg/dL (ref 8–23)
CO2: 20 mmol/L — ABNORMAL LOW (ref 22–32)
Calcium: 8.5 mg/dL — ABNORMAL LOW (ref 8.9–10.3)
Chloride: 104 mmol/L (ref 98–111)
Creatinine, Ser: 0.84 mg/dL (ref 0.61–1.24)
GFR, Estimated: >60 mL/min (ref >60)
Glucose, Bld: 93 mg/dL (ref 70–99)
Phosphorus: 3.7 mg/dL (ref 2.5–4.6)
Potassium: 3.9 mmol/L (ref 3.5–5.1)
Sodium: 133 mmol/L — ABNORMAL LOW (ref 135–145)

## 2023-07-24 LAB — HEPATIC FUNCTION PANEL
ALT: 128 U/L — ABNORMAL HIGH (ref 0–44)
AST: 79 U/L — ABNORMAL HIGH (ref 15–41)
Albumin: 2.9 g/dL — ABNORMAL LOW (ref 3.5–5.0)
Alkaline Phosphatase: 240 U/L — ABNORMAL HIGH (ref 38–126)
Bilirubin, Direct: 1.3 mg/dL — ABNORMAL HIGH (ref 0.0–0.2)
Indirect Bilirubin: 1.5 mg/dL — ABNORMAL HIGH (ref 0.3–0.9)
Total Bilirubin: 2.8 mg/dL — ABNORMAL HIGH (ref 0.0–1.2)
Total Protein: 6.9 g/dL (ref 6.5–8.1)

## 2023-07-24 LAB — MAGNESIUM: Magnesium: 1.8 mg/dL (ref 1.7–2.4)

## 2023-07-24 MED ORDER — BUTALBITAL-APAP-CAFFEINE 50-325-40 MG PO TABS
1.0000 | ORAL_TABLET | Freq: Four times a day (QID) | ORAL | Status: AC | PRN
  Administered 2023-07-24 (×2): 1 via ORAL
  Filled 2023-07-24 (×2): qty 1

## 2023-07-24 MED ORDER — VALACYCLOVIR HCL 500 MG PO TABS
1000.0000 mg | ORAL_TABLET | Freq: Three times a day (TID) | ORAL | Status: DC
  Administered 2023-07-24 – 2023-07-26 (×2): 1000 mg via ORAL
  Filled 2023-07-24 (×7): qty 2

## 2023-07-24 NOTE — Plan of Care (Signed)
  Problem: Education: Goal: Knowledge of General Education information will improve Description: Including pain rating scale, medication(s)/side effects and non-pharmacologic comfort measures Outcome: Progressing   Problem: Clinical Measurements: Goal: Respiratory complications will improve Outcome: Progressing   Problem: Nutrition: Goal: Adequate nutrition will be maintained Outcome: Progressing   Problem: Elimination: Goal: Will not experience complications related to bowel motility Outcome: Progressing   Problem: Pain Managment: Goal: General experience of comfort will improve and/or be controlled Outcome: Progressing   Problem: Skin Integrity: Goal: Risk for impaired skin integrity will decrease Outcome: Progressing

## 2023-07-24 NOTE — Progress Notes (Signed)
 3 Days Post-Op   Subjective/Chief Complaint: Doing fine, no pain   Objective: Vital signs in last 24 hours: Temp:  [97.6 F (36.4 C)-98.2 F (36.8 C)] 98 F (36.7 C) (06/18 0725) Pulse Rate:  [63-80] 63 (06/18 0425) Resp:  [18-22] 19 (06/18 0725) BP: (144-179)/(92-102) 157/96 (06/18 0725) SpO2:  [92 %-94 %] 94 % (06/18 0425) Last BM Date : 07/23/23  Intake/Output from previous day: 06/17 0701 - 06/18 0700 In: 360 [P.O.:360] Out: 400 [Urine:400] Intake/Output this shift: Total I/O In: -  Out: 300 [Urine:300]  Ab soft nontender  Lab Results:  Recent Labs    07/23/23 0318 07/24/23 0540  WBC 10.9* 9.0  HGB 11.7* 13.4  HCT 35.3* 39.6  PLT 197 220   BMET Recent Labs    07/23/23 0318 07/24/23 0540  NA 136 133*  K 3.5 3.9  CL 106 104  CO2 21* 20*  GLUCOSE 84 93  BUN 12 9  CREATININE 0.83 0.84  CALCIUM 7.8* 8.5*   PT/INR No results for input(s): LABPROT, INR in the last 72 hours. ABG No results for input(s): PHART, HCO3 in the last 72 hours.  Invalid input(s): PCO2, PO2  Studies/Results: No results found.  Anti-infectives: Anti-infectives (From admission, onward)    Start     Dose/Rate Route Frequency Ordered Stop   07/20/23 1745  cefTRIAXone (ROCEPHIN) 2 g in sodium chloride  0.9 % 100 mL IVPB        2 g 200 mL/hr over 30 Minutes Intravenous Every 24 hours 07/20/23 1645     07/20/23 1745  metroNIDAZOLE (FLAGYL) IVPB 500 mg        500 mg 100 mL/hr over 60 Minutes Intravenous Every 12 hours 07/20/23 1645         Assessment/Plan: Cholelithiasis, cholangitis, biliary stricture   FEN - regular, npo after mn VTE - SCD's ID -Flagyl and Rocephin, wbc normal today Foley -none Plan - Per discussion with gi and hpb will proceed with lap chole.  Due to urgent cases I will not be able to do today. Discussed with patient and wife will plan for surgery tomorrow. Can eat today  I reviewed hospitalist notes, last 24 h vitals and pain scores,  last 48 h intake and output, last 24 h labs and trends, and last 24 h imaging results.     Enid Harry 07/24/2023

## 2023-07-24 NOTE — Progress Notes (Signed)
 Mobility Specialist Progress Note;   07/24/23 1047  Mobility  Activity Refused mobility  Mobility Specialist Start Time (ACUTE ONLY) 1047   Pt refusing mobility at this time d/t having a headache. Will f/u this afternoon.   Janit Meline Mobility Specialist Please contact via SecureChat or [employer] [phone]

## 2023-07-24 NOTE — Progress Notes (Addendum)
 Patient ID: Kyle Holloway., male   DOB: September 04, 1940, 83 y.o.   MRN: [id number]     Attending physician's note   I have taken a history, reviewed the chart, and examined the patient. I performed a substantive portion of this encounter, including complete performance of at least one of the key components, in conjunction with the APP. I agree with the APP's note, impression, and recommendations with my edits.   Plan for OR tomorrow for cholecystectomy.  Exam today with possible shingles rash on right mid back.  - Possible zoster diagnosis/management per primary Hospital service - Cholecystectomy tomorrow per General Surgery - I will message his primary outpatient Gastroenterologist at [facility] clinic with plan for outpatient EUS and possible ERCP w/ stent exchange in approximately 4-6 weeks - Check ANA, IgG4, CA 19-9  Kyle Baade, DO, FACG [phone] office          Progress Note   Subjective   Day # 4 CC; acute cholecystitis, cholangitis, common bile duct stricture  IV Rocephin/metronidazole  Patient says he has been feeling okay today had a headache that started a couple of hours ago.  His blood pressure was elevated and that is being treated, still has headache.  He also mentions a rash on his right side which is new today very itchy but not painful thus far. Current complaints of abdominal pain, no fever  Labs today-WBC 9.0/hemoglobin 13.4/hematocrit 39.6 Sodium 133/potassium 3.9/BUN 9/creatinine 0.84 T. bili 2.8/alk phos 240/AST 79/ALT 128 ANA and IgG4 pending Blood cultures no growth   Objective   Vital signs in last 24 hours: Temp:  [97.4 F (36.3 C)-98.2 F (36.8 C)] 97.4 F (36.3 C) (06/18 1158) Pulse Rate:  [63-80] 75 (06/18 0828) Resp:  [19-22] 19 (06/18 0725) BP: (144-179)/(93-99) 162/97 (06/18 1158) SpO2:  [92 %-94 %] 93 % (06/18 1158) Last BM Date : 07/23/23 General:    Very pleasant elderly white male male in NAD Heart:  Regular  rate and rhythm; no murmurs Lungs: Respirations even and unlabored, lungs CTA bilaterally Abdomen:  Soft, nontender and nondistended. Normal bowel sounds. Extremities:  Without edema.  He has an erythematous macular/papular rash on the right mid back, does not cross midline and does not extend to the abdominal wall Neurologic:  Alert and oriented,  grossly normal neurologically. Psych:  Cooperative. Normal mood and affect.  Intake/Output from previous day: 06/17 0701 - 06/18 0700 In: 360 [P.O.:360] Out: 400 [Urine:400] Intake/Output this shift: Total I/O In: 364.5 [IV Piggyback:364.5] Out: 300 [Urine:300]  Lab Results: Recent Labs    07/22/23 0303 07/23/23 0318 07/24/23 0540  WBC 18.4* 10.9* 9.0  HGB 11.4* 11.7* 13.4  HCT 35.1* 35.3* 39.6  PLT 173 197 220   BMET Recent Labs    07/22/23 0303 07/23/23 0318 07/24/23 0540  NA 136 136 133*  K 3.9 3.5 3.9  CL 107 106 104  CO2 21* 21* 20*  GLUCOSE 127* 84 93  BUN 14 12 9   CREATININE 1.00 0.83 0.84  CALCIUM 8.2* 7.8* 8.5*   LFT Recent Labs    07/24/23 0800  PROT 6.9  ALBUMIN 2.9*  AST 79*  ALT 128*  ALKPHOS 240*  BILITOT 2.8*  BILIDIR 1.3*  IBILI 1.5*   PT/INR No results for input(s): LABPROT, INR in the last 72 hours.  Studies/Results: No results found.     Assessment / Plan:    #56 83 year old white male admitted with abdominal pain fever nausea vomiting and workup consistent  with sepsis and concerns for acute cholecystitis and cholangitis.  Patient was transferred here from [facility]  MRI/MRCP with cholelithiasis, acute cholecystitis mild diffuse biliary ductal dilation, abrupt termination at the mid common bile duct superior to the pancreatic head suspicious for stricture so noted mild diffuse pancreatic ductal dilation and ductal irregularity concerning for chronic pancreatitis  ERCP 07/21/2023 with finding of a short distal common bile duct stricture benign-appearing, brushings were done  and temporary plastic stent placed, he did have pus coming from the duct at the time of the procedure. Cytology shows atypical cells favored reactive inflammatory change  Patient has been on IV antibiotics since admission, blood cultures negative Generally feeling much better than on admission.  #2 history of Crohn's disease-no recent flares, holding Colazal  #3 headache possibly secondary to hypertension  #4 new rash-right back question shingles/zoster Will notify hospitalist  Plan; Patient is scheduled for laparoscopic cholecystectomy tomorrow. Complete course of metronidazole and Rocephin ( day 4) He will need follow-up with his gastroenterologist at the [facility] clinic, and will need to be set up for EUS and stent exchange either through [facility] or [facility].  We will communicate with his primary gastroenterologist there. Have ordered ANA and IgG4     Principal Problem:   Severe sepsis (HCC) Active Problems:   ACID REFLUX DISEASE   Crohn's disease (HCC)   Ocular myasthenia (HCC)   Benign essential hypertension   Chronic obstructive pulmonary disease (HCC)   Gallbladder calculus with acute cholecystitis   Biliary stricture   Acute cholangitis     LOS: 4 days   Kyle EsterwoodPA-C  07/24/2023, 3:23 PM

## 2023-07-24 NOTE — Plan of Care (Signed)

## 2023-07-24 NOTE — Progress Notes (Signed)
 PROGRESS NOTE  Kyle RABIDEAU Sr. ZOX:[id number] DOB: 1940/06/07   PCP: Nestor Banter, MD  Patient is from: Home through [facility].  Independently ambulates at baseline.  DOA: 07/20/2023 LOS: 4    Brief Narrative / Interim history: 83 year old M with PMH of Crohn's disease, myasthenia gravis, esophageal stricture, diaphragmatic hernia, GERD, COPD, BPH and hard of hearing presented to [facility] ED with acute lethargy, weakness, upper abdominal pain, subjective fever, nausea admitted with severe sepsis in the setting of acute calculus cholecystitis and acute cholangitis.  Initially presented to [facility].  Had elevated LFTs, hyperbilirubinemia.  RUQ US  concerning for gallstone.  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 Transferred to [facility] for ERCP since there is no availability of ERCP over the weekend at [facility]. Had ERCP with biliary sphincterectomy and plastic stent to CBD.  Biliary tree was swept and pus was found.  Cell cytology obtained.  Pathology showed atypical cells favoring reactive inflammatory change.  General surgery consulted, plan for lap chole on 07/25/23.     Subjective: Patient reports headache, noted BP to be uncontrolled.  Shortly after seeing patient, RN reported right-sided back rash, which was initially itchy but then resolved.  Rash is not accompanied with pain.  GI saw patient and thought it may be possible shingles and recommended starting treatment.  Picture posted on media, does not cross the midline but denies any pain.    Objective: Vitals:   07/24/23 0828 07/24/23 1158 07/24/23 1419 07/24/23 1525  BP: (!) 157/96 (!) 162/97 132/88   Pulse: 75     Resp:    18   Temp:  (!) 97.4 F (36.3 C)  98 F (36.7 C)  TempSrc:  Oral  Oral  SpO2:  93%  93%  Weight:      Height:        Examination:  General: NAD  Cardiovascular: S1, S2 present Respiratory: CTAB Abdomen: Soft, nontender, nondistended, bowel sounds present Musculoskeletal: No bilateral pedal edema noted Skin: Rash noted on right sided back, denies any pain, questionable shingles Psychiatry: Normal mood   Consultants:  Gastroenterology General Surgery  Procedures: 6/15-ERCP  Microbiology summarized: 6/14-blood culture at [facility]  Assessment and plan: Severe sepsis due to acute calculus cholecystitis and acute cholangitis Tachycardia, tachypnea, leukocytosis and lactic acidosis at [facility] RUQ US , MRCP and ERCP finding as above.  Cytology with atypical cells favoring reactive inflammatory changes.  Lactic acidosis resolved.  Leukocytosis resolved. Liver enzymes improved. Continue IV ceftriaxone and Flagyl General Surgery plan for lap chole on 07/25/2023  Acute calculus cholecystitis: Antibiotics and general surgery input as above Common bile duct stricture: s/p ERCP and plastic stent to common bile duct stricture Elevated liver enzymes/hyperbilirubinemia: Improving  Questionable rash on the right side of back ??  Questionable shingles, denies any pain GI recommending treatment, started on valacyclovir, plan to reevaluate in a.m. and possible discontinue valacyclovir if not significantly convincing that its shingles  Essential hypertension BP remains uncontrolled Continue home atenolol 25 mg daily.  I will not increase this given history of MG Increase p.o. hydralazine  to 50 mg 3 times daily IV hydralazine  as needed   History of chron's disease Stable Holding home Colazal  in the setting of infection.  Discussed with GI.   Myasthenia gravis  Stable. Continue home Mestinon  Hold methotrexate  in the setting of infection   Elevated troponin/demand ischemia in the setting of  severe sepsis Managing sepsis as above   Hiatal hernia/GERD Protonix .   History of COPD Stable As needed bronchodilators.   BPH without LUTS Continue Proscar  and Flomax .   Body mass index is 29.84 kg/m.           DVT prophylaxis:  SCDs Start: 07/20/23 1727  Code Status: Full code Family Communication: Updated patient's wife at bedside. Level of care: Telemetry Medical Status is: Inpatient Remains inpatient appropriate because: Severe sepsis, acute calculus cholecystitis, cholangitis and biliary stricture   Final disposition: Likely home once medically stable   55 minutes with more than 50% spent in reviewing records, counseling patient/family and coordinating care.   Sch Meds:  Scheduled Meds:  atenolol  25 mg Oral Daily   Chlorhexidine  Gluconate Cloth  6 each Topical Daily   Chlorhexidine  Gluconate Cloth  6 each Topical Daily   finasteride   5 mg Oral Daily   hydrALAZINE   50 mg Oral Q8H   pantoprazole   40 mg Oral Daily   prednisoLONE acetate  1 drop Left Eye QHS   pyridostigmine   60 mg Oral BID   sodium chloride  flush  10-40 mL Intracatheter Q12H   tamsulosin   0.4 mg Oral Daily   valACYclovir  1,000 mg Oral TID   Continuous Infusions:  cefTRIAXone (ROCEPHIN)  IV 2 g (07/24/23 1725)   metronidazole 500 mg (07/24/23 1725)   PRN Meds:.albuterol , butalbital-acetaminophen -caffeine, fentaNYL  (SUBLIMAZE ) injection, hydrALAZINE , ondansetron  **OR** ondansetron  (ZOFRAN ) IV, oxyCODONE   Antimicrobials: Anti-infectives (From admission, onward)    Start     Dose/Rate Route Frequency Ordered Stop   07/24/23 2200  valACYclovir (VALTREX) tablet 1,000 mg        1,000 mg Oral 3 times daily 07/24/23 1827 07/29/23 2159   07/20/23 1745  cefTRIAXone (ROCEPHIN) 2 g in sodium chloride  0.9 % 100 mL IVPB        2 g 200 mL/hr over 30 Minutes Intravenous Every 24 hours 07/20/23 1645     07/20/23 1745  metroNIDAZOLE (FLAGYL) IVPB 500 mg        500 mg 100 mL/hr over 60 Minutes  Intravenous Every 12 hours 07/20/23 1645          I have personally reviewed the following labs and images: CBC: Recent Labs  Lab 07/20/23 0629 07/20/23 1732 07/21/23 1152 07/22/23 0303 07/23/23 0318 07/24/23 0540  WBC 16.5* 27.5* 19.0* 18.4* 10.9* 9.0  NEUTROABS 15.7* 25.9*  --   --   --   --   HGB 13.8 12.1* 11.8* 11.4* 11.7* 13.4  HCT 41.5 36.3* 35.7* 35.1* 35.3* 39.6  MCV 89.1 89.2 89.5 88.6 87.6 87.8  PLT 185 209 174 173 197 220   BMP &GFR Recent Labs  Lab 07/20/23 1732 07/21/23 1152 07/22/23 0303 07/23/23 0318 07/24/23 0540  NA 137 136 136 136 133*  K 4.4 4.2 3.9 3.5 3.9  CL 105 104 107 106 104  CO2 22 22 21* 21* 20*  GLUCOSE 111* 144* 127* 84 93  BUN 12 11 14 12 9   CREATININE 1.16 0.94 1.00 0.83 0.84  CALCIUM 8.5* 8.3* 8.2* 7.8* 8.5*  MG 1.6* 1.8 1.9 1.9 1.8  PHOS  --   --   --   --  3.7   Estimated Creatinine Clearance: 76.8 mL/min (by C-G formula based on SCr of 0.84 mg/dL). Liver & Pancreas: Recent Labs  Lab 07/20/23 1732 07/21/23  1152 07/22/23 0303 07/23/23 0318 07/24/23 0540 07/24/23 0800  AST 165* 115* 106* 104*  --  79*  ALT 235* 185* 160* 148*  --  128*  ALKPHOS 191* 162* 180* 210*  --  240*  BILITOT 6.3* 5.2* 3.8* 2.9*  --  2.8*  PROT 6.5 6.5 6.2* 6.0*  --  6.9  ALBUMIN 2.9* 2.9* 2.6* 2.6* 2.8* 2.9*   Recent Labs  Lab 07/20/23 0805 07/20/23 1732 07/21/23 1152  LIPASE 40 32 42   No results for input(s): AMMONIA in the last 168 hours. Diabetic: No results for input(s): HGBA1C in the last 72 hours. No results for input(s): GLUCAP in the last 168 hours. Cardiac Enzymes: No results for input(s): CKTOTAL, CKMB, CKMBINDEX, TROPONINI in the last 168 hours. No results for input(s): PROBNP in the last 8760 hours. Coagulation Profile: No results for input(s): INR, PROTIME in the last 168 hours. Thyroid  Function Tests: No results for input(s): TSH, T4TOTAL, FREET4, T3FREE, THYROIDAB in the last 72  hours. Lipid Profile: No results for input(s): CHOL, HDL, LDLCALC, TRIG, CHOLHDL, LDLDIRECT in the last 72 hours. Anemia Panel: No results for input(s): VITAMINB12, FOLATE, FERRITIN, TIBC, IRON , RETICCTPCT in the last 72 hours. Urine analysis:    Component Value Date/Time   COLORURINE AMBER (A) 07/20/2023 1251   APPEARANCEUR CLEAR (A) 07/20/2023 1251   LABSPEC 1.019 07/20/2023 1251   PHURINE 6.0 07/20/2023 1251   GLUCOSEU NEGATIVE 07/20/2023 1251   HGBUR NEGATIVE 07/20/2023 1251   BILIRUBINUR NEGATIVE 07/20/2023 1251   KETONESUR NEGATIVE 07/20/2023 1251   PROTEINUR 30 (A) 07/20/2023 1251   NITRITE NEGATIVE 07/20/2023 1251   LEUKOCYTESUR NEGATIVE 07/20/2023 1251   Sepsis Labs: Invalid input(s): PROCALCITONIN, LACTICIDVEN  Microbiology: Recent Results (from the past 240 hours)  Blood culture (routine x 2)     Status: None (Preliminary result)   Collection Time: 07/20/23  8:05 AM   Specimen: BLOOD  Result Value Ref Range Status   Specimen Description BLOOD LEFT ANTECUBITAL  Final   Special Requests   Final    BOTTLES DRAWN AEROBIC AND ANAEROBIC Blood Culture adequate volume   Culture   Final    NO GROWTH 4 DAYS Performed at [facility], [address] [place], [place] [address]    Report Status PENDING  Incomplete  MRSA Next Gen by PCR, Nasal     Status: None   Collection Time: 07/21/23  6:27 AM   Specimen: Nasal Mucosa; Nasal Swab  Result Value Ref Range Status   MRSA by PCR Next Gen NOT DETECTED NOT DETECTED Final    Comment: (NOTE) The GeneXpert MRSA Assay (FDA approved for NASAL specimens only), is one component of a comprehensive MRSA colonization surveillance program. It is not intended to diagnose MRSA infection nor to guide or monitor treatment for MRSA infections. Test performance is not FDA approved in patients less than 14 years old. Performed at [facility], [address], [place], [place] [address]      Radiology Studies: No results found.     Veronica Gordon, MD [facility]  If 7PM-7AM, please contact night-coverage [email or web address] 07/24/2023, 6:42 PM

## 2023-07-25 ENCOUNTER — Other Ambulatory Visit: Payer: Self-pay

## 2023-07-25 ENCOUNTER — Inpatient Hospital Stay (HOSPITAL_COMMUNITY): Payer: Self-pay | Admitting: Certified Registered Nurse Anesthetist

## 2023-07-25 ENCOUNTER — Encounter (HOSPITAL_COMMUNITY): Payer: Self-pay | Admitting: Student

## 2023-07-25 ENCOUNTER — Encounter (HOSPITAL_COMMUNITY)
Admission: EM | Disposition: A | Discharge status: Home / Self Care | Payer: Self-pay | Source: Other Acute Inpatient Hospital | Attending: Student

## 2023-07-25 DIAGNOSIS — K5090 Crohn's disease, unspecified, without complications: Secondary | ICD-10-CM | POA: Diagnosis not present

## 2023-07-25 DIAGNOSIS — A419 Sepsis, unspecified organism: Secondary | ICD-10-CM | POA: Diagnosis not present

## 2023-07-25 DIAGNOSIS — K8001 Calculus of gallbladder with acute cholecystitis with obstruction: Secondary | ICD-10-CM | POA: Diagnosis not present

## 2023-07-25 DIAGNOSIS — K8010 Calculus of gallbladder with chronic cholecystitis without obstruction: Secondary | ICD-10-CM

## 2023-07-25 DIAGNOSIS — R6520 Severe sepsis without septic shock: Secondary | ICD-10-CM | POA: Diagnosis not present

## 2023-07-25 HISTORY — PX: CHOLECYSTECTOMY: SHX55

## 2023-07-25 LAB — CBC WITH DIFFERENTIAL/PLATELET
Abs Immature Granulocytes: 0.9 10*3/uL — ABNORMAL HIGH (ref 0.00–0.07)
Basophils Absolute: 0 10*3/uL (ref 0.0–0.1)
Basophils Relative: 0 %
Eosinophils Absolute: 0.3 10*3/uL (ref 0.0–0.5)
Eosinophils Relative: 3 %
HCT: 41.2 % (ref 39.0–52.0)
Hemoglobin: 13.9 g/dL (ref 13.0–17.0)
Lymphocytes Relative: 7 %
Lymphs Abs: 0.7 10*3/uL (ref 0.7–4.0)
MCH: 29.6 pg (ref 26.0–34.0)
MCHC: 33.7 g/dL (ref 30.0–36.0)
MCV: 87.7 fL (ref 80.0–100.0)
Monocytes Absolute: 1 10*3/uL (ref 0.1–1.0)
Monocytes Relative: 10 %
Myelocytes: 9 %
Neutro Abs: 7 10*3/uL (ref 1.7–7.7)
Neutrophils Relative %: 71 %
Platelets: 259 10*3/uL (ref 150–400)
RBC: 4.7 MIL/uL (ref 4.22–5.81)
RDW: 26.2 % — ABNORMAL HIGH (ref 11.5–15.5)
WBC: 9.8 10*3/uL (ref 4.0–10.5)
nRBC: 0 % (ref 0.0–0.2)
nRBC: 0 /100{WBCs}

## 2023-07-25 LAB — ANA W/REFLEX IF POSITIVE: Anti Nuclear Antibody (ANA): POSITIVE — AB

## 2023-07-25 LAB — COMPREHENSIVE METABOLIC PANEL WITH GFR
ALT: 114 U/L — ABNORMAL HIGH (ref 0–44)
AST: 78 U/L — ABNORMAL HIGH (ref 15–41)
Albumin: 2.8 g/dL — ABNORMAL LOW (ref 3.5–5.0)
Alkaline Phosphatase: 226 U/L — ABNORMAL HIGH (ref 38–126)
Anion gap: 12 (ref 5–15)
BUN: 10 mg/dL (ref 8–23)
CO2: 20 mmol/L — ABNORMAL LOW (ref 22–32)
Calcium: 8.5 mg/dL — ABNORMAL LOW (ref 8.9–10.3)
Chloride: 100 mmol/L (ref 98–111)
Creatinine, Ser: 0.91 mg/dL (ref 0.61–1.24)
GFR, Estimated: >60 mL/min (ref >60)
Glucose, Bld: 109 mg/dL — ABNORMAL HIGH (ref 70–99)
Potassium: 3.8 mmol/L (ref 3.5–5.1)
Sodium: 132 mmol/L — ABNORMAL LOW (ref 135–145)
Total Bilirubin: 2.4 mg/dL — ABNORMAL HIGH (ref 0.0–1.2)
Total Protein: 6.6 g/dL (ref 6.5–8.1)

## 2023-07-25 LAB — ENA+DNA/DS+ANTICH+CENTRO+JO...
Anti JO-1: <0.2 AI (ref 0.0–0.9)
Centromere Ab Screen: <0.2 AI (ref 0.0–0.9)
Chromatin Ab SerPl-aCnc: <0.2 AI (ref 0.0–0.9)
ENA SM Ab Ser-aCnc: <0.2 AI (ref 0.0–0.9)
Ribonucleic Protein: 0.6 AI (ref 0.0–0.9)
SSA (Ro) (ENA) Antibody, IgG: 3.6 AI — ABNORMAL HIGH (ref 0.0–0.9)
SSB (La) (ENA) Antibody, IgG: <0.2 AI (ref 0.0–0.9)
Scleroderma (Scl-70) (ENA) Antibody, IgG: <0.2 AI (ref 0.0–0.9)
ds DNA Ab: 1 [IU]/mL (ref 0–9)

## 2023-07-25 LAB — IGG 4: IgG, Subclass 4: 26 mg/dL (ref 2–96)

## 2023-07-25 SURGERY — LAPAROSCOPIC CHOLECYSTECTOMY
Anesthesia: General | Site: Abdomen

## 2023-07-25 MED ORDER — PHENYLEPHRINE 80 MCG/ML (10ML) SYRINGE FOR IV PUSH (FOR BLOOD PRESSURE SUPPORT)
PREFILLED_SYRINGE | INTRAVENOUS | Status: AC
  Filled 2023-07-25: qty 20

## 2023-07-25 MED ORDER — PHENYLEPHRINE 80 MCG/ML (10ML) SYRINGE FOR IV PUSH (FOR BLOOD PRESSURE SUPPORT)
PREFILLED_SYRINGE | INTRAVENOUS | Status: DC | PRN
  Administered 2023-07-25 (×2): 80 ug via INTRAVENOUS

## 2023-07-25 MED ORDER — FENTANYL CITRATE (PF) 250 MCG/5ML IJ SOLN
INTRAMUSCULAR | Status: DC | PRN
  Administered 2023-07-25 (×2): 50 ug via INTRAVENOUS

## 2023-07-25 MED ORDER — OXYCODONE HCL 5 MG PO TABS: 5.0000 mg | ORAL_TABLET | Freq: Once | ORAL | Status: DC | PRN

## 2023-07-25 MED ORDER — ONDANSETRON HCL 4 MG/2ML IJ SOLN
INTRAMUSCULAR | Status: DC | PRN
  Administered 2023-07-25: 4 mg via INTRAVENOUS

## 2023-07-25 MED ORDER — HYDRALAZINE HCL 20 MG/ML IJ SOLN
INTRAMUSCULAR | Status: AC
  Filled 2023-07-25: qty 1

## 2023-07-25 MED ORDER — CHLORHEXIDINE GLUCONATE 0.12 % MT SOLN
OROMUCOSAL | Status: AC
  Filled 2023-07-25: qty 15

## 2023-07-25 MED ORDER — ONDANSETRON HCL 4 MG/2ML IJ SOLN
INTRAMUSCULAR | Status: AC
  Filled 2023-07-25: qty 2

## 2023-07-25 MED ORDER — PROPOFOL 10 MG/ML IV BOLUS
INTRAVENOUS | Status: DC | PRN
  Administered 2023-07-25: 120 mg via INTRAVENOUS

## 2023-07-25 MED ORDER — LABETALOL HCL 5 MG/ML IV SOLN
INTRAVENOUS | Status: AC
  Filled 2023-07-25: qty 4

## 2023-07-25 MED ORDER — ACETAMINOPHEN 10 MG/ML IV SOLN: 1000.0000 mg | Freq: Once | INTRAVENOUS | Status: DC | PRN

## 2023-07-25 MED ORDER — LIDOCAINE 2% (20 MG/ML) 5 ML SYRINGE
INTRAMUSCULAR | Status: AC
  Filled 2023-07-25: qty 10

## 2023-07-25 MED ORDER — LABETALOL HCL 5 MG/ML IV SOLN: 5.0000 mg | INTRAVENOUS | Status: DC | PRN

## 2023-07-25 MED ORDER — FENTANYL CITRATE (PF) 250 MCG/5ML IJ SOLN
INTRAMUSCULAR | Status: AC
Start: 2023-07-25 — End: 2023-07-25
  Filled 2023-07-25: qty 5

## 2023-07-25 MED ORDER — INDOCYANINE GREEN 25 MG IV SOLR: 2.5000 mg | Freq: Once | INTRAVENOUS | Status: AC

## 2023-07-25 MED ORDER — OXYCODONE HCL 5 MG PO TABS
10.0000 mg | ORAL_TABLET | Freq: Four times a day (QID) | ORAL | Status: DC | PRN
  Administered 2023-07-26: 10 mg via ORAL
  Filled 2023-07-25 (×2): qty 2

## 2023-07-25 MED ORDER — FENTANYL CITRATE (PF) 100 MCG/2ML IJ SOLN
INTRAMUSCULAR | Status: AC
  Filled 2023-07-25: qty 2

## 2023-07-25 MED ORDER — FENTANYL CITRATE (PF) 100 MCG/2ML IJ SOLN: 25.0000 ug | INTRAMUSCULAR | Status: DC | PRN

## 2023-07-25 MED ORDER — HYDRALAZINE HCL 20 MG/ML IJ SOLN: 5.0000 mg | Freq: Once | INTRAMUSCULAR | Status: DC

## 2023-07-25 MED ORDER — BUPIVACAINE-EPINEPHRINE 0.25% -1:200000 IJ SOLN
INTRAMUSCULAR | Status: DC | PRN
  Administered 2023-07-25: 9.5 mL

## 2023-07-25 MED ORDER — ROCURONIUM BROMIDE 10 MG/ML (PF) SYRINGE
PREFILLED_SYRINGE | INTRAVENOUS | Status: DC | PRN
  Administered 2023-07-25: 60 mg via INTRAVENOUS

## 2023-07-25 MED ORDER — LIDOCAINE 2% (20 MG/ML) 5 ML SYRINGE
INTRAMUSCULAR | Status: DC | PRN
  Administered 2023-07-25: 20 mg via INTRAVENOUS

## 2023-07-25 MED ORDER — BUPIVACAINE-EPINEPHRINE (PF) 0.25% -1:200000 IJ SOLN
INTRAMUSCULAR | Status: AC
  Filled 2023-07-25: qty 30

## 2023-07-25 MED ORDER — OXYCODONE HCL 5 MG/5ML PO SOLN: 5.0000 mg | Freq: Once | ORAL | Status: DC | PRN

## 2023-07-25 MED ORDER — ORAL CARE MOUTH RINSE
15.0000 mL | Freq: Once | OROMUCOSAL | Status: AC
Start: 2023-07-25 — End: 2023-07-25

## 2023-07-25 MED ORDER — SODIUM CHLORIDE 0.9 % IR SOLN
Status: DC | PRN
Start: 2023-07-25 — End: 2023-07-25
  Administered 2023-07-25: 1

## 2023-07-25 MED ORDER — DEXAMETHASONE SODIUM PHOSPHATE 10 MG/ML IJ SOLN
INTRAMUSCULAR | Status: DC | PRN
  Administered 2023-07-25: 10 mg via INTRAVENOUS

## 2023-07-25 MED ORDER — ROCURONIUM BROMIDE 10 MG/ML (PF) SYRINGE
PREFILLED_SYRINGE | INTRAVENOUS | Status: AC
  Filled 2023-07-25: qty 20

## 2023-07-25 MED ORDER — LACTATED RINGERS IV SOLN: INTRAVENOUS | Status: DC

## 2023-07-25 MED ORDER — CHLORHEXIDINE GLUCONATE 0.12 % MT SOLN
15.0000 mL | Freq: Once | OROMUCOSAL | Status: AC
  Filled 2023-07-25: qty 15

## 2023-07-25 MED ORDER — 0.9 % SODIUM CHLORIDE (POUR BTL) OPTIME
TOPICAL | Status: DC | PRN
  Administered 2023-07-25: 1000 mL

## 2023-07-25 MED ORDER — SUGAMMADEX SODIUM 200 MG/2ML IV SOLN
INTRAVENOUS | Status: DC | PRN
  Administered 2023-07-25: 200 mg via INTRAVENOUS

## 2023-07-25 MED ORDER — DROPERIDOL 2.5 MG/ML IJ SOLN: 0.6250 mg | Freq: Once | INTRAMUSCULAR | Status: DC | PRN

## 2023-07-25 MED ORDER — HEMOSTATIC AGENTS (NO CHARGE) OPTIME
TOPICAL | Status: DC | PRN
  Administered 2023-07-25: 1 via TOPICAL

## 2023-07-25 MED ORDER — DEXAMETHASONE SODIUM PHOSPHATE 10 MG/ML IJ SOLN
INTRAMUSCULAR | Status: AC
  Filled 2023-07-25: qty 2

## 2023-07-25 SURGICAL SUPPLY — 34 items
BAG COUNTER SPONGE SURGICOUNT (BAG) ×1 IMPLANT
BLADE CLIPPER SURG (BLADE) IMPLANT
CANISTER SUCTION 3000ML PPV (SUCTIONS) ×1 IMPLANT
CHLORAPREP W/TINT 26 (MISCELLANEOUS) ×2 IMPLANT
CLIP APPLIE 5 13 M/L LIGAMAX5 (MISCELLANEOUS) ×1 IMPLANT
COVER SURGICAL LIGHT HANDLE (MISCELLANEOUS) ×1 IMPLANT
DERMABOND ADVANCED .7 DNX12 (GAUZE/BANDAGES/DRESSINGS) ×1 IMPLANT
ELECTRODE REM PT RTRN 9FT ADLT (ELECTROSURGICAL) ×2 IMPLANT
GLOVE BIO SURGEON STRL SZ7 (GLOVE) ×2 IMPLANT
GLOVE BIOGEL PI IND STRL 7.5 (GLOVE) ×2 IMPLANT
GOWN STRL REUS W/ TWL LRG LVL3 (GOWN DISPOSABLE) ×3 IMPLANT
GRASPER SUT TROCAR 14GX15 (MISCELLANEOUS) ×1 IMPLANT
HEMOSTAT SNOW SURGICEL 2X4 (HEMOSTASIS) IMPLANT
IRRIGATION SUCT STRKRFLW 2 WTP (MISCELLANEOUS) ×2 IMPLANT
KIT BASIN OR (CUSTOM PROCEDURE TRAY) ×2 IMPLANT
KIT IMAGING PINPOINTPAQ (MISCELLANEOUS) IMPLANT
KIT TURNOVER KIT B (KITS) ×1 IMPLANT
NS IRRIG 1000ML POUR BTL (IV SOLUTION) ×2 IMPLANT
PAD ARMBOARD POSITIONER FOAM (MISCELLANEOUS) ×1 IMPLANT
POUCH RETRIEVAL ECOSAC 10 (ENDOMECHANICALS) ×2 IMPLANT
SCISSORS LAP 5X35 DISP (ENDOMECHANICALS) ×1 IMPLANT
SET TUBE SMOKE EVAC HIGH FLOW (TUBING) ×2 IMPLANT
SLEEVE Z-THREAD 5X100MM (TROCAR) ×4 IMPLANT
SPECIMEN JAR SMALL (MISCELLANEOUS) ×1 IMPLANT
STRIP CLOSURE SKIN 1/2X4 (GAUZE/BANDAGES/DRESSINGS) ×2 IMPLANT
SUT MNCRL AB 4-0 PS2 18 (SUTURE) ×1 IMPLANT
SUT VICRYL 0 UR6 27IN ABS (SUTURE) ×1 IMPLANT
TOWEL GREEN STERILE (TOWEL DISPOSABLE) ×2 IMPLANT
TOWEL GREEN STERILE FF (TOWEL DISPOSABLE) ×2 IMPLANT
TRAY LAPAROSCOPIC MC (CUSTOM PROCEDURE TRAY) ×2 IMPLANT
TROCAR BALLN 12MMX100 BLUNT (TROCAR) ×2 IMPLANT
TROCAR Z-THREAD OPTICAL 5X100M (TROCAR) ×1 IMPLANT
WARMER LAPAROSCOPE (MISCELLANEOUS) ×1 IMPLANT
WATER STERILE IRR 1000ML POUR (IV SOLUTION) ×2 IMPLANT

## 2023-07-25 NOTE — Progress Notes (Signed)
 PROGRESS NOTE  Kyle MONTELONGO Sr. BJY:[id number] DOB: 1940-07-29   PCP: Nestor Banter, MD  Patient is from: Home through [facility].  Independently ambulates at baseline.  DOA: 07/20/2023 LOS: 5    Brief Narrative / Interim history: 83 year old M with PMH of Crohn's disease, myasthenia gravis, esophageal stricture, diaphragmatic hernia, GERD, COPD, BPH and hard of hearing presented to [facility] ED with acute lethargy, weakness, upper abdominal pain, subjective fever, nausea admitted with severe sepsis in the setting of acute calculus cholecystitis and acute cholangitis.  Initially presented to [facility].  Had elevated LFTs, hyperbilirubinemia.  RUQ US  concerning for gallstone.  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 Transferred to [facility] for ERCP since there is no availability of ERCP over the weekend at [facility]. Had ERCP with biliary sphincterectomy and plastic stent to CBD.  Biliary tree was swept and pus was found.  Cell cytology obtained.  Pathology showed atypical cells favoring reactive inflammatory change.  General surgery consulted, plan for lap chole on 07/25/23.     Subjective: Denies any new complaints.  Saw patient prior to lap chole.    Objective: Vitals:   07/25/23 1500 07/25/23 1515 07/25/23 1527 07/25/23 1800  BP: (!) 178/109 (!) 147/92  (!) 142/88  Pulse: 76 77    Resp: 12 14 18    Temp:   (!) 97.5 F (36.4 C)   TempSrc:   Oral   SpO2: 93% 91%    Weight:      Height:        Examination:  General: NAD  Cardiovascular: S1, S2 present Respiratory: CTAB Abdomen: Soft, nontender, nondistended, bowel sounds present Musculoskeletal: No bilateral pedal edema  noted Skin: Rash noted on right sided back, denies any pain, questionable shingles Psychiatry: Normal mood   Consultants:  Gastroenterology General Surgery  Procedures: 6/15-ERCP  Microbiology summarized: 6/14-blood culture at [facility]  Assessment and plan: Severe sepsis due to acute calculus cholecystitis and acute cholangitis S/p lap chole on 07/25/2023 Tachycardia, tachypnea, leukocytosis and lactic acidosis at [facility] RUQ US , MRCP and ERCP finding as above.  Cytology with atypical cells favoring reactive inflammatory changes.  Lactic acidosis resolved.  Leukocytosis resolved. Liver enzymes improved. Continue IV ceftriaxone and Flagyl  Acute calculus cholecystitis: Antibiotics and general surgery input as above Common bile duct stricture: s/p ERCP and plastic stent to common bile duct stricture Elevated liver enzymes/hyperbilirubinemia: Improving  Questionable rash on the right side of back Possible shingles, prior history on face ??  Questionable shingles, denies any pain (prior episode, patient reports minimal to no pain) Started on valacyclovir, plan to reevaluate daily   Essential hypertension BP remains uncontrolled Continue home atenolol 25 mg daily.  I will not increase this given history of MG Increase p.o. hydralazine  to 50 mg 3 times daily IV hydralazine  as needed   History of chron's disease Stable Holding home Colazal  in the setting of infection.  Discussed with GI.   Myasthenia gravis Stable. Continue home Mestinon  Hold methotrexate  in the setting of infection   Elevated troponin/demand ischemia in the setting of severe sepsis Managing sepsis as above   Hiatal hernia/GERD Protonix .   History of COPD Stable As needed bronchodilators.   BPH without LUTS Continue Proscar  and Flomax .   Body mass index is  29.13 kg/m (pended).           DVT prophylaxis:  SCDs Start: 07/20/23 1727  Code Status: Full code Family Communication: Updated  patient's wife/daughter at bedside. Level of care: Telemetry Medical Status is: Inpatient Remains inpatient appropriate because: Severe sepsis, acute calculus cholecystitis, cholangitis and biliary stricture   Final disposition: Likely home once medically stable   55 minutes with more than 50% spent in reviewing records, counseling patient/family and coordinating care.   Sch Meds:  Scheduled Meds:  atenolol  25 mg Oral Daily   Chlorhexidine  Gluconate Cloth  6 each Topical Daily   Chlorhexidine  Gluconate Cloth  6 each Topical Daily   finasteride   5 mg Oral Daily   hydrALAZINE   50 mg Oral Q8H   pantoprazole   40 mg Oral Daily   prednisoLONE acetate  1 drop Left Eye QHS   pyridostigmine   60 mg Oral BID   sodium chloride  flush  10-40 mL Intracatheter Q12H   tamsulosin   0.4 mg Oral Daily   valACYclovir  1,000 mg Oral TID   Continuous Infusions:  cefTRIAXone (ROCEPHIN)  IV 2 g (07/25/23 1736)   metronidazole 500 mg (07/25/23 1736)   PRN Meds:.albuterol , fentaNYL  (SUBLIMAZE ) injection, hydrALAZINE , ondansetron  **OR** ondansetron  (ZOFRAN ) IV, oxyCODONE   Antimicrobials: Anti-infectives (From admission, onward)    Start     Dose/Rate Route Frequency Ordered Stop   07/24/23 2200  valACYclovir (VALTREX) tablet 1,000 mg        1,000 mg Oral 3 times daily 07/24/23 1827 07/29/23 2159   07/20/23 1745  cefTRIAXone (ROCEPHIN) 2 g in sodium chloride  0.9 % 100 mL IVPB        2 g 200 mL/hr over 30 Minutes Intravenous Every 24 hours 07/20/23 1645     07/20/23 1745  metroNIDAZOLE (FLAGYL) IVPB 500 mg        500 mg 100 mL/hr over 60 Minutes Intravenous Every 12 hours 07/20/23 1645          I have personally reviewed the following labs and images: CBC: Recent Labs  Lab 07/20/23 0629 07/20/23 1732 07/21/23 1152 07/22/23 0303 07/23/23 0318 07/24/23 0540 07/25/23 0520  WBC 16.5* 27.5* 19.0* 18.4* 10.9* 9.0 9.8  NEUTROABS 15.7* 25.9*  --   --   --   --  7.0  HGB 13.8 12.1* 11.8* 11.4*  11.7* 13.4 13.9  HCT 41.5 36.3* 35.7* 35.1* 35.3* 39.6 41.2  MCV 89.1 89.2 89.5 88.6 87.6 87.8 87.7  PLT 185 209 174 173 197 220 259   BMP &GFR Recent Labs  Lab 07/20/23 1732 07/21/23 1152 07/22/23 0303 07/23/23 0318 07/24/23 0540 07/25/23 0520  NA 137 136 136 136 133* 132*  K 4.4 4.2 3.9 3.5 3.9 3.8  CL 105 104 107 106 104 100  CO2 22 22 21* 21* 20* 20*  GLUCOSE 111* 144* 127* 84 93 109*  BUN 12 11 14 12 9 10   CREATININE 1.16 0.94 1.00 0.83 0.84 0.91  CALCIUM 8.5* 8.3* 8.2* 7.8* 8.5* 8.5*  MG 1.6* 1.8 1.9 1.9 1.8  --   PHOS  --   --   --   --  3.7  --    Estimated Creatinine Clearance: 70.9 mL/min (by C-G formula based on SCr of 0.91 mg/dL). Liver & Pancreas: Recent Labs  Lab 07/21/23 1152 07/22/23 0303 07/23/23 0318 07/24/23 0540 07/24/23 0800 07/25/23 0520  AST 115* 106* 104*  --  79* 78*  ALT 185* 160* 148*  --  128* 114*  ALKPHOS  162* 180* 210*  --  240* 226*  BILITOT 5.2* 3.8* 2.9*  --  2.8* 2.4*  PROT 6.5 6.2* 6.0*  --  6.9 6.6  ALBUMIN 2.9* 2.6* 2.6* 2.8* 2.9* 2.8*   Recent Labs  Lab 07/20/23 0805 07/20/23 1732 07/21/23 1152  LIPASE 40 32 42   No results for input(s): AMMONIA in the last 168 hours. Diabetic: No results for input(s): HGBA1C in the last 72 hours. No results for input(s): GLUCAP in the last 168 hours. Cardiac Enzymes: No results for input(s): CKTOTAL, CKMB, CKMBINDEX, TROPONINI in the last 168 hours. No results for input(s): PROBNP in the last 8760 hours. Coagulation Profile: No results for input(s): INR, PROTIME in the last 168 hours. Thyroid  Function Tests: No results for input(s): TSH, T4TOTAL, FREET4, T3FREE, THYROIDAB in the last 72 hours. Lipid Profile: No results for input(s): CHOL, HDL, LDLCALC, TRIG, CHOLHDL, LDLDIRECT in the last 72 hours. Anemia Panel: No results for input(s): VITAMINB12, FOLATE, FERRITIN, TIBC, IRON , RETICCTPCT in the last 72 hours. Urine analysis:     Component Value Date/Time   COLORURINE AMBER (A) 07/20/2023 1251   APPEARANCEUR CLEAR (A) 07/20/2023 1251   LABSPEC 1.019 07/20/2023 1251   PHURINE 6.0 07/20/2023 1251   GLUCOSEU NEGATIVE 07/20/2023 1251   HGBUR NEGATIVE 07/20/2023 1251   BILIRUBINUR NEGATIVE 07/20/2023 1251   KETONESUR NEGATIVE 07/20/2023 1251   PROTEINUR 30 (A) 07/20/2023 1251   NITRITE NEGATIVE 07/20/2023 1251   LEUKOCYTESUR NEGATIVE 07/20/2023 1251   Sepsis Labs: Invalid input(s): PROCALCITONIN, LACTICIDVEN  Microbiology: Recent Results (from the past 240 hours)  Blood culture (routine x 2)     Status: None (Preliminary result)   Collection Time: 07/20/23  8:05 AM   Specimen: BLOOD  Result Value Ref Range Status   Specimen Description BLOOD LEFT ANTECUBITAL  Final   Special Requests   Final    BOTTLES DRAWN AEROBIC AND ANAEROBIC Blood Culture adequate volume   Culture   Final    NO GROWTH 4 DAYS Performed at [facility], [address] [place], [place] [address]    Report Status PENDING  Incomplete  MRSA Next Gen by PCR, Nasal     Status: None   Collection Time: 07/21/23  6:27 AM   Specimen: Nasal Mucosa; Nasal Swab  Result Value Ref Range Status   MRSA by PCR Next Gen NOT DETECTED NOT DETECTED Final    Comment: (NOTE) The GeneXpert MRSA Assay (FDA approved for NASAL specimens only), is one component of a comprehensive MRSA colonization surveillance program. It is not intended to diagnose MRSA infection nor to guide or monitor treatment for MRSA infections. Test performance is not FDA approved in patients less than 15 years old. Performed at [facility], [address], [place], [place] [address]     Radiology Studies: No results found.     Veronica Gordon, MD [facility]  If 7PM-7AM, please contact night-coverage [email or web address] 07/25/2023, 7:32 PM

## 2023-07-25 NOTE — Progress Notes (Signed)
*   Day of Surgery *   Subjective/Chief Complaint: Doing well, ready for surgery   Objective: Vital signs in last 24 hours: Temp:  [97.4 F (36.3 C)-98.3 F (36.8 C)] 98.3 F (36.8 C) (06/19 0726) Pulse Rate:  [70-86] 86 (06/19 0726) Resp:  [12-20] 18 (06/19 0726) BP: (132-162)/(76-101) 146/99 (06/19 0726) SpO2:  [91 %-94 %] 94 % (06/19 0726) Last BM Date : 07/24/23  Intake/Output from previous day: 06/18 0701 - 06/19 0700 In: 564.5 [IV Piggyback:564.5] Out: 400 [Urine:400] Intake/Output this shift: No intake/output data recorded.  Ab nontender  Lab Results:  Recent Labs    07/24/23 0540 07/25/23 0520  WBC 9.0 9.8  HGB 13.4 13.9  HCT 39.6 41.2  PLT 220 259   BMET Recent Labs    07/24/23 0540 07/25/23 0520  NA 133* 132*  K 3.9 3.8  CL 104 100  CO2 20* 20*  GLUCOSE 93 109*  BUN 9 10  CREATININE 0.84 0.91  CALCIUM 8.5* 8.5*   PT/INR No results for input(s): LABPROT, INR in the last 72 hours. ABG No results for input(s): PHART, HCO3 in the last 72 hours.  Invalid input(s): PCO2, PO2  Studies/Results: No results found.  Anti-infectives: Anti-infectives (From admission, onward)    Start     Dose/Rate Route Frequency Ordered Stop   07/24/23 2200  [MAR Hold]  valACYclovir (VALTREX) tablet 1,000 mg        (MAR Hold since Thu 07/25/2023 at 1150.Hold Reason: Transfer to a Procedural area)   1,000 mg Oral 3 times daily 07/24/23 1827 07/29/23 2159   07/20/23 1745  [MAR Hold]  cefTRIAXone (ROCEPHIN) 2 g in sodium chloride  0.9 % 100 mL IVPB        (MAR Hold since Thu 07/25/2023 at 1150.Hold Reason: Transfer to a Procedural area)   2 g 200 mL/hr over 30 Minutes Intravenous Every 24 hours 07/20/23 1645     07/20/23 1745  [MAR Hold]  metroNIDAZOLE (FLAGYL) IVPB 500 mg        (MAR Hold since Thu 07/25/2023 at 1150.Hold Reason: Transfer to a Procedural area)   500 mg 100 mL/hr over 60 Minutes Intravenous Every 12 hours 07/20/23 1645          Assessment/Plan: Cholelithiasis, cholangitis, biliary stricture   FEN - regular, npo after mn VTE - SCD's ID -Flagyl and Rocephin Foley -none Plan - lap chole today  Enid Harry 07/25/2023

## 2023-07-25 NOTE — Transfer of Care (Signed)
 Immediate Anesthesia Transfer of Care Note  Patient: Kyle Kotyk Sr.  Procedure(s) Performed: LAPAROSCOPIC CHOLECYSTECTOMY (Abdomen)  Patient Location: PACU  Anesthesia Type:General  Level of Consciousness: awake, alert , and oriented  Airway & Oxygen Therapy: Patient Spontanous Breathing and Patient connected to face mask oxygen  Post-op Assessment: Report given to RN and Post -op Vital signs reviewed and stable  Post vital signs: Reviewed and stable  Last Vitals:  Vitals Value Taken Time  BP 172/103 07/25/23 13:48  Temp    Pulse 81 07/25/23 13:50  Resp 19 07/25/23 13:50  SpO2 93 % 07/25/23 13:50  Vitals shown include unfiled device data.  Last Pain:  Vitals:   07/25/23 1202  TempSrc: (P) Oral  PainSc:       Patients Stated Pain Goal: 0 (07/24/23 2116)  Complications: No notable events documented.

## 2023-07-25 NOTE — Progress Notes (Signed)
 Pt is complaining of unrelieved pain with 5 mg Oxycodone  previously administered, attempted to notify Alla Isaacs, MD. Awaiting further orders.  Sonjia Durie, RN

## 2023-07-25 NOTE — Anesthesia Postprocedure Evaluation (Signed)
 Anesthesia Post Note  Patient: Kyle SAVASTANO Sr.  Procedure(s) Performed: LAPAROSCOPIC CHOLECYSTECTOMY (Abdomen)     Patient location during evaluation: PACU Anesthesia Type: General Level of consciousness: awake and alert, oriented and patient cooperative Pain management: pain level controlled Vital Signs Assessment: post-procedure vital signs reviewed and stable Respiratory status: spontaneous breathing, nonlabored ventilation and respiratory function stable Cardiovascular status: blood pressure returned to baseline and stable Postop Assessment: no apparent nausea or vomiting Anesthetic complications: no  No notable events documented.  Last Vitals:  Vitals:   07/25/23 1515 07/25/23 1527  BP: (!) 147/92   Pulse: 77   Resp: 14 18  Temp:  (!) 36.4 C  SpO2: 91%     Last Pain:  Vitals:   07/25/23 1527  TempSrc: Oral  PainSc:                  Sherwood Castilla,E. Karnell Vanderloop

## 2023-07-25 NOTE — Anesthesia Preprocedure Evaluation (Addendum)
 Anesthesia Evaluation  Patient identified by MRN, date of birth, ID band Patient awake    Reviewed: Allergy & Precautions, NPO status , Patient's Chart, lab work & pertinent test results  Airway Mallampati: I  TM Distance: >3 FB Neck ROM: Full    Dental  (+) Upper Dentures, Lower Dentures   Pulmonary COPD,  COPD inhaler, former smoker   breath sounds clear to auscultation       Cardiovascular hypertension, Pt. on home beta blockers  Rhythm:Regular Rate:Normal     Neuro/Psych  Neuromuscular disease  negative psych ROS   GI/Hepatic Neg liver ROS,GERD  Medicated,,  Endo/Other  negative endocrine ROS    Renal/GU negative Renal ROS     Musculoskeletal  (+) Arthritis ,    Abdominal   Peds  Hematology  (+) Blood dyscrasia, anemia   Anesthesia Other Findings   Reproductive/Obstetrics                             Anesthesia Physical Anesthesia Plan  ASA: 3  Anesthesia Plan: General   Post-op Pain Management: Tylenol  PO (pre-op)*   Induction: Intravenous  PONV Risk Score and Plan: 3 and Ondansetron  and Treatment may vary due to age or medical condition  Airway Management Planned: Oral ETT  Additional Equipment: None  Intra-op Plan:   Post-operative Plan: Extubation in OR  Informed Consent: I have reviewed the patients History and Physical, chart, labs and discussed the procedure including the risks, benefits and alternatives for the proposed anesthesia with the patient or authorized representative who has indicated his/her understanding and acceptance.     Dental advisory given  Plan Discussed with: CRNA  Anesthesia Plan Comments:        Anesthesia Quick Evaluation

## 2023-07-25 NOTE — Op Note (Signed)
 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 Donavan Fuchs Assistant: Marlin Simmonds, PA-C Estimated blood loss: Less than 50 cc Anesthesia: General Complications: None Drains: None Specimens: Gallbladder and contents to pathology Sponge needle count was correct at completion Disposition to recovery in stable condition  Indications: This is an 83 year old male who initially presented with cholangitis.  He underwent an ERCP with stenting of a biliary stricture.  These brushings returned as atypical cells favor reactive.  He has a stent in place and has gotten better from that.  In discussion with HPB surgery as well as GI I elected to proceed with a cholecystectomy as I think this was part of his issue.  The plan from here is going to repeat his ERCP and brushings.  We discussed the lap chole and the complications associated with that.  Procedure: After informed consent was obtained he was taken to the operating room.  He was given antibiotics.  SCDs were in place.  He was given indocyanine green dye.  He was placed under general anesthesia without complication.  He was prepped and draped in standard sterile surgical fashion.  A surgical timeout was then performed.  I made a incision below his umbilicus after infiltrating local anesthetic.  I carried this to the fascia.  The fascia was entered sharply.  The peritoneum was entered bluntly without injury.  I then placed a 0 Vicryl pursestring suture through the fascia and inserted a Hassan trocar.  The abdomen was insufflated to 15 mmHg pressure.  I then inserted 3 additional 5 mm trocars in epigastrium and right upper quadrant under direct vision without complication.  His gallbladder was noted to have chronic cholecystitis.  I retracted it cephalad and lateral.  I was able to  dissect out the critical view of safety in the triangle.  I clearly identified the cystic duct and cystic artery.  I used the green dye to identify the cystic duct and a portion of the common bile duct.  I then took a good portion of the gallbladder off the cystic plate to ensure this was the correct view.  I then clipped the duct 4 times.  The clips completely traverse the duct and the duct was viable.  I then divided the duct leaving 3 clips in place.  I treated the artery in a similar fashion.  The gallbladder was then removed from the liver bed.  This was somewhat difficult due to this chronic inflammation.  The gallbladder was then replaced in a retrieval bag and removed from the abdomen.  I then obtained hemostasis in the gallbladder bed.  I did place a piece of Surgicel snow in the bed at the completion.  I then removed the [facility] trocar and tied my pursestring down.  Using the suture passer I then placed an additional 0 Vicryl suture to completely obliterate that defect.  The remaining trocars were removed and the abdomen desufflated.  These were then closed with 4 Monocryl and glue.  He tolerated his well was extubated transferred to recovery stable.

## 2023-07-25 NOTE — Anesthesia Procedure Notes (Signed)
 Procedure Name: Intubation Date/Time: 07/25/2023 12:46 PM  Performed by: Laroy Plunk, CRNAPre-anesthesia Checklist: Patient identified, Emergency Drugs available, Suction available and Patient being monitored Patient Re-evaluated:Patient Re-evaluated prior to induction Oxygen Delivery Method: Circle System Utilized Preoxygenation: Pre-oxygenation with 100% oxygen Induction Type: IV induction Ventilation: Mask ventilation without difficulty and Oral airway inserted - appropriate to patient size Laryngoscope Size: Mac and 4 Grade View: Grade I Tube type: Oral Tube size: 7.0 mm Number of attempts: 1 Airway Equipment and Method: Stylet and Oral airway Placement Confirmation: ETT inserted through vocal cords under direct vision, positive ETCO2 and breath sounds checked- equal and bilateral Secured at: 23 cm Tube secured with: Tape Dental Injury: Teeth and Oropharynx as per pre-operative assessment

## 2023-07-26 ENCOUNTER — Encounter (HOSPITAL_COMMUNITY): Payer: Self-pay | Admitting: General Surgery

## 2023-07-26 ENCOUNTER — Other Ambulatory Visit (HOSPITAL_COMMUNITY): Payer: Self-pay

## 2023-07-26 LAB — COMPREHENSIVE METABOLIC PANEL WITH GFR
ALT: 111 U/L — ABNORMAL HIGH (ref 0–44)
AST: 83 U/L — ABNORMAL HIGH (ref 15–41)
Albumin: 2.8 g/dL — ABNORMAL LOW (ref 3.5–5.0)
Alkaline Phosphatase: 210 U/L — ABNORMAL HIGH (ref 38–126)
Anion gap: 10 (ref 5–15)
BUN: 11 mg/dL (ref 8–23)
CO2: 19 mmol/L — ABNORMAL LOW (ref 22–32)
Calcium: 8.3 mg/dL — ABNORMAL LOW (ref 8.9–10.3)
Chloride: 102 mmol/L (ref 98–111)
Creatinine, Ser: 0.95 mg/dL (ref 0.61–1.24)
GFR, Estimated: >60 mL/min (ref >60)
Glucose, Bld: 119 mg/dL — ABNORMAL HIGH (ref 70–99)
Potassium: 4.3 mmol/L (ref 3.5–5.1)
Sodium: 131 mmol/L — ABNORMAL LOW (ref 135–145)
Total Bilirubin: 1.8 mg/dL — ABNORMAL HIGH (ref 0.0–1.2)
Total Protein: 6.8 g/dL (ref 6.5–8.1)

## 2023-07-26 LAB — CBC WITH DIFFERENTIAL/PLATELET
Abs Immature Granulocytes: 0.6 10*3/uL — ABNORMAL HIGH (ref 0.00–0.07)
Basophils Absolute: 0.1 10*3/uL (ref 0.0–0.1)
Basophils Relative: 1 %
Eosinophils Absolute: 0.1 10*3/uL (ref 0.0–0.5)
Eosinophils Relative: 0 %
HCT: 42.6 % (ref 39.0–52.0)
Hemoglobin: 14.2 g/dL (ref 13.0–17.0)
Immature Granulocytes: 4 %
Lymphocytes Relative: 7 %
Lymphs Abs: 1 10*3/uL (ref 0.7–4.0)
MCH: 29.2 pg (ref 26.0–34.0)
MCHC: 33.3 g/dL (ref 30.0–36.0)
MCV: 87.5 fL (ref 80.0–100.0)
Monocytes Absolute: 1.5 10*3/uL — ABNORMAL HIGH (ref 0.1–1.0)
Monocytes Relative: 11 %
Neutro Abs: 11.2 10*3/uL — ABNORMAL HIGH (ref 1.7–7.7)
Neutrophils Relative %: 77 %
Platelets: 302 10*3/uL (ref 150–400)
RBC: 4.87 MIL/uL (ref 4.22–5.81)
RDW: 25.4 % — ABNORMAL HIGH (ref 11.5–15.5)
WBC: 14.6 10*3/uL — ABNORMAL HIGH (ref 4.0–10.5)
nRBC: 0 % (ref 0.0–0.2)

## 2023-07-26 LAB — SURGICAL PATHOLOGY

## 2023-07-26 MED ORDER — TRAMADOL HCL 50 MG PO TABS: 50.0000 mg | ORAL_TABLET | Freq: Four times a day (QID) | ORAL | 0 refills | Status: DC | PRN

## 2023-07-26 MED ORDER — VALACYCLOVIR HCL 1 G PO TABS
1000.0000 mg | ORAL_TABLET | Freq: Three times a day (TID) | ORAL | 0 refills | Status: AC
  Filled 2023-07-26: qty 9, 3d supply, fill #0

## 2023-07-26 MED ORDER — AMOXICILLIN-POT CLAVULANATE 875-125 MG PO TABS
1.0000 | ORAL_TABLET | Freq: Two times a day (BID) | ORAL | 0 refills | Status: AC
Start: 2023-07-26 — End: 2023-07-31
  Filled 2023-07-26: qty 10, 5d supply, fill #0

## 2023-07-26 MED ORDER — TRAMADOL HCL 50 MG PO TABS
50.0000 mg | ORAL_TABLET | Freq: Four times a day (QID) | ORAL | 0 refills | Status: DC | PRN
  Filled 2023-07-26 (×2): qty 10, 3d supply, fill #0

## 2023-07-26 MED ORDER — PYRIDOSTIGMINE BROMIDE 60 MG PO TABS: 60.0000 mg | ORAL_TABLET | Freq: Two times a day (BID) | ORAL | Status: AC

## 2023-07-26 MED ORDER — HYDRALAZINE HCL 50 MG PO TABS
50.0000 mg | ORAL_TABLET | Freq: Two times a day (BID) | ORAL | 0 refills | Status: AC
  Filled 2023-07-26: qty 60, 30d supply, fill #0

## 2023-07-26 NOTE — Progress Notes (Signed)
 1 Day Post-Op   Subjective/Chief Complaint: Wants to go home, tol diet, voiding   Objective: Vital signs in last 24 hours: Temp:  [97.5 F (36.4 C)-98.7 F (37.1 C)] 97.9 F (36.6 C) (06/20 0734) Pulse Rate:  [75-92] 79 (06/20 0734) Resp:  [11-21] 15 (06/20 0734) BP: (132-184)/(88-114) 132/89 (06/20 0734) SpO2:  [90 %-96 %] 90 % (06/20 0734) Weight:  [92.1 kg] (P) 92.1 kg (06/19 1202) Last BM Date : 07/24/23  Intake/Output from previous day: 06/19 0701 - 06/20 0700 In: 500 [I.V.:500] Out: 5 [Blood:5] Intake/Output this shift: No intake/output data recorded.  Ab soft approp tender, incisions clean  Lab Results:  Recent Labs    07/25/23 0520 07/26/23 0305  WBC 9.8 14.6*  HGB 13.9 14.2  HCT 41.2 42.6  PLT 259 302   BMET Recent Labs    07/25/23 0520 07/26/23 0305  NA 132* 131*  K 3.8 4.3  CL 100 102  CO2 20* 19*  GLUCOSE 109* 119*  BUN 10 11  CREATININE 0.91 0.95  CALCIUM 8.5* 8.3*   PT/INR No results for input(s): LABPROT, INR in the last 72 hours. ABG No results for input(s): PHART, HCO3 in the last 72 hours.  Invalid input(s): PCO2, PO2  Studies/Results: No results found.  Anti-infectives: Anti-infectives (From admission, onward)    Start     Dose/Rate Route Frequency Ordered Stop   07/24/23 2200  valACYclovir (VALTREX) tablet 1,000 mg        1,000 mg Oral 3 times daily 07/24/23 1827 07/29/23 2159   07/20/23 1745  cefTRIAXone (ROCEPHIN) 2 g in sodium chloride  0.9 % 100 mL IVPB        2 g 200 mL/hr over 30 Minutes Intravenous Every 24 hours 07/20/23 1645     07/20/23 1745  metroNIDAZOLE (FLAGYL) IVPB 500 mg        500 mg 100 mL/hr over 60 Minutes Intravenous Every 12 hours 07/20/23 1645         Assessment/Plan: Cholelithiasis, cholangitis, biliary stricture Lap chole POD 1  -labs fine this am -can dc home from my standpoint  -does not need abx for gb-- just for completing cholangitis therapy -I will see in several  weeks -GI setting up ercp   FEN - regular VTE - SCD's ID -Flagyl and Rocephin Foley -none  Kyle Holloway 07/26/2023

## 2023-07-26 NOTE — Discharge Instructions (Signed)

## 2023-07-26 NOTE — Progress Notes (Incomplete)
Patient provided with verbal discharge instructions. Paper copy of discharge provided to patient. RN answered all questions. VSS at discharge. IV removed. Patient belongings sent with patient. Patient dc'd via wheelchair to private vehicle  

## 2023-07-26 NOTE — Progress Notes (Signed)
 Pt stated that he has been voiding & that his wife (at the bedside) has been emptying his urinal. Pt educated that staff members need to measure urine. Will continue to monitor.  Sonjia Durie, RN

## 2023-07-26 NOTE — Discharge Summary (Signed)
 Physician Discharge Summary   Patient: Kyle BARNICK Sr. MRN: [id number] DOB: 01/02/1941  Admit date:     07/20/2023  Discharge date: 07/26/23  Discharge Physician: Veronica Gordon   PCP: Nestor Banter, MD   Recommendations at discharge:   Follow-up with PCP in 1 week, with repeat labs Follow-up with general surgery as scheduled  Discharge Diagnoses: Principal Problem:   Severe sepsis (HCC) Active Problems:   ACID REFLUX DISEASE   Crohn's disease (HCC)   Ocular myasthenia (HCC)   Benign essential hypertension   Chronic obstructive pulmonary disease (HCC)   Gallbladder calculus with acute cholecystitis   Biliary stricture   Acute cholangitis    Hospital Course: 83 year old M with PMH of Crohn's disease, myasthenia gravis, esophageal stricture, diaphragmatic hernia, GERD, COPD, BPH and hard of hearing presented to [facility] ED with acute lethargy, weakness, upper abdominal pain, subjective fever, nausea admitted with severe sepsis in the setting of acute calculus cholecystitis and acute cholangitis. Initially presented to [facility]. Had elevated LFTs, hyperbilirubinemia. RUQ US  concerning for gallstone. 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 Transferred to [facility] for ERCP since there is no availability of ERCP over the weekend at [facility]. Had ERCP with biliary sphincterectomy and plastic stent to CBD. Biliary tree was swept and pus was found. Cell cytology obtained. Pathology showed atypical cells favoring reactive inflammatory change. General surgery consulted, s/p lap chole on 07/25/23.     Today, patient reports some mild postop pain, denies any nausea/vomiting,  fever/chills, chest pain, shortness of breath.  Discussed extensively with patient, wife and daughter at bedside.  Patient very eager to be discharged.    Assessment and Plan:  Severe sepsis due to acute calculus cholecystitis and acute cholangitis Acute calculus cholecystitis Common bile duct stricture: s/p ERCP and plastic stent to common bile duct stricture Elevated liver enzymes/hyperbilirubinemia: Improving S/p lap chole on 07/25/2023 Tachycardia, tachypnea, leukocytosis and lactic acidosis at [facility] RUQ US , MRCP and ERCP finding as above Cytology with atypical cells favoring reactive inflammatory changes S/p IV ceftriaxone and Flagyl--> switch to p.o. Augmentin for another 5 full days Outpatient follow-up with general surgery Outpatient follow-up with patient's GI at [facility] for repeat ERCP  Leukocytosis Likely reactive to recent surgery PCP to repeat labs and follow-up  Questionable rash on the right side of back Possible shingles, prior history of shingles on face Previous episode, patient reports minimal to no pain Continue on valacyclovir for total of 5 days Follow-up with PCP   Essential hypertension BP remains uncontrolled Continue home atenolol 25 mg daily, recently started on hydralazine  to 50 mg twice daily here in the hospital due to uncontrolled BP PCP to further adjust pending BP readings   History of chron's disease Stable Continue home Colazal    Myasthenia gravis Stable Continue home Mestinon , methotrexate    Elevated troponin/demand ischemia in the setting of severe sepsis Managing sepsis as above   Hiatal hernia/GERD Protonix .   History of COPD Stable As needed bronchodilators.   BPH without LUTS Continue Proscar  and Flomax .      Pain control - [place]  Controlled Substance Reporting System database was reviewed. and patient was instructed, not to drive, operate heavy machinery, perform activities at heights, swimming or participation in  water activities or provide  baby-sitting services while on Pain, Sleep and Anxiety Medications; until their outpatient Physician has advised to do so again. Also recommended to not to take more than prescribed Pain, Sleep and Anxiety Medications.   Consultants: GI, general surgery Procedures performed: ERCP, lap chole Disposition: Home Diet recommendation:  Cardiac diet   DISCHARGE MEDICATION: Allergies as of 07/26/2023   No Known Allergies      Medication List     TAKE these medications    acetaminophen  325 MG tablet Commonly known as: TYLENOL  Take 650 mg by mouth 3 (three) times daily as needed (pain).   albuterol  108 (90 Base) MCG/ACT inhaler Commonly known as: VENTOLIN  HFA Inhale 2 puffs into the lungs every 6 (six) hours as needed for wheezing or shortness of breath.   amoxicillin-clavulanate 875-125 MG tablet Commonly known as: AUGMENTIN Take 1 tablet by mouth 2 (two) times daily for 5 days.   aspirin  EC 81 MG tablet Take 81 mg by mouth daily.   atenolol 25 MG tablet Commonly known as: TENORMIN Take 1 tablet by mouth daily.   balsalazide 750 MG capsule Commonly known as: COLAZAL  Take 750 mg by mouth 3 (three) times daily.   Biotin 1 MG Caps Take 1 mg by mouth daily.   cyanocobalamin  1000 MCG tablet Commonly known as: VITAMIN B12 Take 1,000 mcg by mouth daily.   finasteride  5 MG tablet Commonly known as: PROSCAR  Take 5 mg by mouth daily.   Fish Oil 1000 MG Caps Take 1,000 mg by mouth daily.   folic acid  1 MG tablet Commonly known as: FOLVITE  Take 1 mg by mouth daily.   hydrALAZINE  50 MG tablet Commonly known as: APRESOLINE  Take 1 tablet (50 mg total) by mouth every 12 (twelve) hours.   IRON  PO Take 1 tablet by mouth daily.   methotrexate  2.5 MG tablet Commonly known as: RHEUMATREX Take 2 tablets (5 mg total) by mouth once a week. Every Wednesday  Caution:Chemotherapy. Protect from light. What changed:  how much to take additional  instructions   multivitamin with minerals tablet Take 1 tablet by mouth 2 (two) times daily with breakfast and lunch.   PRESERVISION AREDS PO Take 2 tablets by mouth daily.   omeprazole  40 MG capsule Commonly known as: PRILOSEC Take 1 capsule (40 mg total) by mouth 2 (two) times daily before a meal.   prednisoLONE acetate 1 % ophthalmic suspension Commonly known as: PRED FORTE Place 1 drop into the left eye at bedtime.   pyridostigmine  60 MG tablet Commonly known as: MESTINON  Take 1 tablet (60 mg total) by mouth in the morning and at bedtime. See Rx fill hx and office visit note 01/18/23   tamsulosin  0.4 MG Caps capsule Commonly known as: FLOMAX  Take 0.4 mg by mouth daily.   traMADol 50 MG tablet Commonly known as: ULTRAM Take 1 tablet (50 mg total) by mouth every 6 (six) hours as needed.   valACYclovir 1000 MG tablet Commonly known as: VALTREX Take 1 tablet (1,000 mg total) by mouth 3 (three) times daily for 3 days.   Vitamin D3 50 MCG (2000 UT) Tabs Take 2,000 Units by mouth daily.        Follow-up Information     Enid Harry, MD Follow up.   Specialty: General Surgery Contact information: [address] [place] [address] [phone]         Nestor Banter, MD. Schedule an appointment as soon as possible for a visit in 1 week(s).   Specialty:  Family Medicine Contact information: [address] [facility] [place] [address] [phone]                Discharge Exam: Cleavon Curls Weights   07/21/23 0732 07/25/23 1202  Weight: 94.3 kg (P) 92.1 kg   General: NAD  Cardiovascular: S1, S2 present Respiratory: CTAB Abdomen: Soft, nontender, nondistended, bowel sounds present, incision C/D/I Musculoskeletal: No bilateral pedal edema noted Skin: Normal Psychiatry: Normal mood   Condition at discharge: stable  The results of significant diagnostics from this hospitalization  (including imaging, microbiology, ancillary and laboratory) are listed below for reference.   Imaging Studies: DG ERCP Result Date: 07/21/2023 CLINICAL DATA:  Acute cholecystitis, biliary duct stricture EXAM: ERCP TECHNIQUE: Multiple spot images obtained with the fluoroscopic device and submitted for interpretation post-procedure. FLUOROSCOPY: Radiation Exposure Index (as provided by the fluoroscopic device): 62.354 mGy Kerma COMPARISON:  MRCP 07/20/2023 FINDINGS: A total of 4 intraoperative saved images are submitted for review. The images demonstrate a flexible duodenal scope in the descending duodenum followed by wire cannulation of the common bile duct. Cholangiography is then performed. Mild intra and extrahepatic biliary ductal dilatation. The cystic duct is patent. The final image demonstrates placement of a plastic biliary stent. IMPRESSION: 1. Mild intra and extrahepatic biliary ductal dilatation with stenosis/occlusion in the mid common bile duct. 2. The cystic duct appears patent. 3. Placement of a plastic biliary stent. These images were submitted for radiologic interpretation only. Please see the procedural report for the amount of contrast and the fluoroscopy time utilized. Electronically Signed   By: Fernando Hoyer M.D.   On: 07/21/2023 11:45   MR ABDOMEN MRCP W WO CONTAST Result Date: 07/20/2023 CLINICAL DATA:  Sepsis.  Elevated bilirubin and transaminitis. EXAM: MRI ABDOMEN WITHOUT AND WITH CONTRAST (INCLUDING MRCP) TECHNIQUE: Multiplanar multisequence MR imaging of the abdomen was performed both before and after the administration of intravenous contrast. Heavily T2-weighted images of the biliary and pancreatic ducts were obtained, and three-dimensional MRCP images were rendered by post processing. CONTRAST:  9mL GADAVIST  GADOBUTROL  1 MMOL/ML IV SOLN COMPARISON:  12/12/2020 FINDINGS: Lower chest: No acute findings. Hepatobiliary: No hepatic masses identified. Tiny gallstones and  gallbladder sludge noted. Gallbladder is distended and shows mild diffuse wall thickening, consistent with acute cholecystitis. Mild diffuse biliary ductal dilatation is seen with common bile duct measuring up to 7 mm. Abrupt termination of mid common bile duct is seen just superior to the pancreatic head, suspicious for stricture. No definite choledocholithiasis. Pancreas: No evidence of pancreatic mass or acute pancreatitis. Mild diffuse pancreatic ductal dilatation is seen with ductal irregularity, suspicious for chronic pancreatitis. Spleen:  Within normal limits in size and appearance. Adrenals/Urinary Tract: No suspicious masses identified. No evidence of hydronephrosis. Stomach/Bowel: Large hiatal hernia is seen. Severe colonic diverticulosis, without signs of diverticulitis involving the visualized portions. Vascular/Lymphatic: No pathologically enlarged lymph nodes identified. 3.1 cm infrarenal abdominal aortic aneurysm. Other:  None. Musculoskeletal:  No suspicious bone lesions identified. IMPRESSION: Findings consistent with acute cholecystitis. Mild diffuse biliary ductal dilatation, with abrupt termination of the mid common bile duct just superior to the pancreatic head, suspicious for stricture. No definite choledocholithiasis. Mild diffuse pancreatic ductal dilatation with ductal irregularity, suspicious for chronic pancreatitis. No No evidence of pancreatic mass. Large hiatal hernia. Severe colonic diverticulosis. 3.1 cm infrarenal abdominal aortic aneurysm. Recommend follow-up ultrasound every 3 years. (Ref.: J Vasc Surg. 2018; 67:2-77 and J Am Coll Radiol 2013;10(10):789-794.) Electronically Signed   By:  Marlyce Sine M.D.   On: 07/20/2023 11:56   MR 3D Recon At Scanner Result Date: 07/20/2023 CLINICAL DATA:  Sepsis.  Elevated bilirubin and transaminitis. EXAM: MRI ABDOMEN WITHOUT AND WITH CONTRAST (INCLUDING MRCP) TECHNIQUE: Multiplanar multisequence MR imaging of the abdomen was performed both  before and after the administration of intravenous contrast. Heavily T2-weighted images of the biliary and pancreatic ducts were obtained, and three-dimensional MRCP images were rendered by post processing. CONTRAST:  9mL GADAVIST  GADOBUTROL  1 MMOL/ML IV SOLN COMPARISON:  12/12/2020 FINDINGS: Lower chest: No acute findings. Hepatobiliary: No hepatic masses identified. Tiny gallstones and gallbladder sludge noted. Gallbladder is distended and shows mild diffuse wall thickening, consistent with acute cholecystitis. Mild diffuse biliary ductal dilatation is seen with common bile duct measuring up to 7 mm. Abrupt termination of mid common bile duct is seen just superior to the pancreatic head, suspicious for stricture. No definite choledocholithiasis. Pancreas: No evidence of pancreatic mass or acute pancreatitis. Mild diffuse pancreatic ductal dilatation is seen with ductal irregularity, suspicious for chronic pancreatitis. Spleen:  Within normal limits in size and appearance. Adrenals/Urinary Tract: No suspicious masses identified. No evidence of hydronephrosis. Stomach/Bowel: Large hiatal hernia is seen. Severe colonic diverticulosis, without signs of diverticulitis involving the visualized portions. Vascular/Lymphatic: No pathologically enlarged lymph nodes identified. 3.1 cm infrarenal abdominal aortic aneurysm. Other:  None. Musculoskeletal:  No suspicious bone lesions identified. IMPRESSION: Findings consistent with acute cholecystitis. Mild diffuse biliary ductal dilatation, with abrupt termination of the mid common bile duct just superior to the pancreatic head, suspicious for stricture. No definite choledocholithiasis. Mild diffuse pancreatic ductal dilatation with ductal irregularity, suspicious for chronic pancreatitis. No No evidence of pancreatic mass. Large hiatal hernia. Severe colonic diverticulosis. 3.1 cm infrarenal abdominal aortic aneurysm. Recommend follow-up ultrasound every 3 years. (Ref.: J Vasc  Surg. 2018; 67:2-77 and J Am Coll Radiol 2013;10(10):789-794.) Electronically Signed   By: Marlyce Sine M.D.   On: 07/20/2023 11:56   US  ABDOMEN LIMITED RUQ (LIVER/GB) Result Date: 07/20/2023 CLINICAL DATA:  [id number] Transaminitis [id number] EXAM: ULTRASOUND ABDOMEN LIMITED RIGHT UPPER QUADRANT COMPARISON:  March 19, 2023, February 14, 2023 FINDINGS: Gallbladder: Multiple cholelithiasis and layering sludge. No wall thickening visualized when remeasured; wall thickness appears the same compared to prior ultrasound. No sonographic Murphy sign noted by sonographer. Common bile duct: Diameter: Visualized portion measures 4 mm, within normal limits. Liver: No focal lesion identified. Within normal limits in parenchymal echogenicity. Portal vein is patent on color Doppler imaging with normal direction of blood flow towards the liver. Other: None. IMPRESSION: Cholelithiasis without sonographic evidence of acute cholecystitis. Electronically Signed   By: Clancy Crimes M.D.   On: 07/20/2023 08:18   DG Chest Portable 1 View Result Date: 07/20/2023 CLINICAL DATA:  weakness, leukocytosis, eval for pna EXAM: PORTABLE CHEST 1 VIEW COMPARISON:  November 07, 2021, December 28, 2017 FINDINGS: Evaluation is limited by rotation. The cardiomediastinal silhouette is unchanged in contour. No pleural effusion. No pneumothorax. Rounded LEFT retrocardiac opacity consistent with a hiatal hernia. Similar biapical scarring with bullous changes. No acute pleuroparenchymal abnormality. IMPRESSION: No acute cardiopulmonary abnormality. Electronically Signed   By: Clancy Crimes M.D.   On: 07/20/2023 08:16   CT Head Wo Contrast Result Date: 07/20/2023 EXAM: CT HEAD WITHOUT CONTRAST 07/20/2023 07:17:00 AM TECHNIQUE: CT of the head was performed without the administration of intravenous contrast. Automated exposure control, iterative reconstruction, and/or weight based adjustment of the mA/kV was utilized to reduce the radiation dose to  as low as reasonably achievable.  COMPARISON: CT head without contrast and MR head without contrast 12/28/2017. CLINICAL HISTORY: Mental status change, unknown cause. BIB EMS for lethargy and weakness. Upon arrival, patient complains of nausea. Patient was 90% on room air on their arrival to the home. Patient on [address] on arrival. Not normally on O2. FINDINGS: BRAIN AND VENTRICLES: There is no acute intracranial hemorrhage, mass effect or midline shift. No abnormal extra-axial fluid collection. The gray-white differentiation is maintained without an acute infarct. There is no hydrocephalus. Mild generalized atrophy and periventricular white matter disease demonstrate some progression since the prior study. ORBITS: Bilateral lens replacements are noted. The globes and orbits are otherwise within normal limits. SINUSES: The visualized paranasal sinuses and mastoid air cells demonstrate no acute abnormality. SOFT TISSUES AND SKULL: No acute abnormality of the visualized skull or soft tissues. IMPRESSION: 1. No acute intracranial abnormality. 2. Mild generalized atrophy and periventricular white matter disease with some progression since the prior study. Electronically signed by: Audree Leas MD 07/20/2023 07:29 AM EDT RP Workstation: [id number]    Microbiology: Results for orders placed or performed during the hospital encounter of 07/20/23  MRSA Next Gen by PCR, Nasal     Status: None   Collection Time: 07/21/23  6:27 AM   Specimen: Nasal Mucosa; Nasal Swab  Result Value Ref Range Status   MRSA by PCR Next Gen NOT DETECTED NOT DETECTED Final    Comment: (NOTE) The GeneXpert MRSA Assay (FDA approved for NASAL specimens only), is one component of a comprehensive MRSA colonization surveillance program. It is not intended to diagnose MRSA infection nor to guide or monitor treatment for MRSA infections. Test performance is not FDA approved in patients less than 66 years old. Performed at [facility], [address], [place], [place] [address]     Labs: CBC: Recent Labs  Lab 07/20/23 [phone] 07/20/23 1732 07/21/23 1152 07/22/23 0303 07/23/23 0318 07/24/23 0540 07/25/23 0520 07/26/23 0305  WBC 16.5* 27.5*   < > 18.4* 10.9* 9.0 9.8 14.6*  NEUTROABS 15.7* 25.9*  --   --   --   --  7.0 11.2*  HGB 13.8 12.1*   < > 11.4* 11.7* 13.4 13.9 14.2  HCT 41.5 36.3*   < > 35.1* 35.3* 39.6 41.2 42.6  MCV 89.1 89.2   < > 88.6 87.6 87.8 87.7 87.5  PLT 185 209   < > 173 197 220 259 302   < > = values in this interval not displayed.   Basic Metabolic Panel: Recent Labs  Lab 07/20/23 1732 07/21/23 1152 07/22/23 0303 07/23/23 0318 07/24/23 0540 07/25/23 0520 07/26/23 0305  NA 137 136 136 136 133* 132* 131*  K 4.4 4.2 3.9 3.5 3.9 3.8 4.3  CL 105 104 107 106 104 100 102  CO2 22 22 21* 21* 20* 20* 19*  GLUCOSE 111* 144* 127* 84 93 109* 119*  BUN 12 11 14 12 9 10 11   CREATININE 1.16 0.94 1.00 0.83 0.84 0.91 0.95  CALCIUM 8.5* 8.3* 8.2* 7.8* 8.5* 8.5* 8.3*  MG 1.6* 1.8 1.9 1.9 1.8  --   --   PHOS  --   --   --   --  3.7  --   --    Liver Function Tests: Recent Labs  Lab 07/22/23 0303 07/23/23 0318 07/24/23 0540 07/24/23 0800 07/25/23 0520 07/26/23 0305  AST 106* 104*  --  79* 78* 83*  ALT 160* 148*  --  128* 114* 111*  ALKPHOS 180*  210*  --  240* 226* 210*  BILITOT 3.8* 2.9*  --  2.8* 2.4* 1.8*  PROT 6.2* 6.0*  --  6.9 6.6 6.8  ALBUMIN 2.6* 2.6* 2.8* 2.9* 2.8* 2.8*   CBG: No results for input(s): GLUCAP in the last 168 hours.  Discharge time spent: greater than 30 minutes.  Signed: Veronica Gordon, MD [facility] 07/26/2023

## 2023-07-26 NOTE — Progress Notes (Signed)
 Mobility Specialist Progress Note;   07/26/23 1028  Mobility  Activity Ambulated with assistance in hallway  Level of Assistance Contact guard assist, steadying assist  Assistive Device None  Distance Ambulated (ft) 150 ft  Activity Response Tolerated well  Mobility Referral Yes  Mobility visit 1 Mobility  Mobility Specialist Start Time (ACUTE ONLY) 1028  Mobility Specialist Stop Time (ACUTE ONLY) 1038  Mobility Specialist Time Calculation (min) (ACUTE ONLY) 10 min   Pt agreeable to mobility. On 3.5LO2 upon arrival. Required MinG assistance during ambulation for safety. Pt required up to 3LO2 to keep SPO2 WFL. No c/o SOB when asked. Pt returned back to sitting on EoB with all needs met, call bell in reach. Family present.   Janit Meline Mobility Specialist Please contact via SecureChat or [employer] [phone]

## 2023-07-26 NOTE — TOC Transition Note (Signed)
 Transition of Care [facility]) - Discharge Note   Patient Details  Name: ED MANDICH Sr. MRN: [id number] Date of Birth: [birth year]  Transition of Care [facility] CM/SW Contact:  Jeani Mill, RN Phone Number: 07/26/2023, 12:17 PM   Clinical Narrative:     Arden Kotyk Sr. is stable to discharge home.  No TOC needs at this time.  Final next level of care: Home/Self Care Barriers to Discharge: Barriers Resolved   Patient Goals and CMS Choice Patient states their goals for this hospitalization and ongoing recovery are:: retrun home          Discharge Placement               Home        Discharge Plan and Services Additional resources added to the After Visit Summary for                                       Social Drivers of Health (SDOH) Interventions SDOH Screenings   Food Insecurity: No Food Insecurity (07/21/2023)  Housing: Low Risk  (07/21/2023)  Transportation Needs: No Transportation Needs (07/21/2023)  Utilities: Not At Risk (07/21/2023)  Depression (PHQ2-9): Low Risk  (05/31/2023)  Financial Resource Strain: Patient Declined (02/07/2023)   Received from [facility]  Social Connections: Moderately Integrated (07/21/2023)  Tobacco Use: Medium Risk (07/25/2023)     Readmission Risk Interventions     No data to display

## 2023-07-26 NOTE — Plan of Care (Signed)
  Problem: Education: Goal: Knowledge of General Education information will improve Description: Including pain rating scale, medication(s)/side effects and non-pharmacologic comfort measures Outcome: Progressing   Problem: Health Behavior/Discharge Planning: Goal: Ability to manage health-related needs will improve Outcome: Progressing   Problem: Clinical Measurements: Goal: Ability to maintain clinical measurements within normal limits will improve Outcome: Progressing Goal: Diagnostic test results will improve Outcome: Progressing Goal: Respiratory complications will improve Outcome: Progressing Goal: Cardiovascular complication will be avoided Outcome: Progressing   Problem: Nutrition: Goal: Adequate nutrition will be maintained Outcome: Progressing   Problem: Elimination: Goal: Will not experience complications related to bowel motility Outcome: Progressing Goal: Will not experience complications related to urinary retention Outcome: Progressing   Problem: Pain Managment: Goal: General experience of comfort will improve and/or be controlled Outcome: Progressing   Problem: Safety: Goal: Ability to remain free from injury will improve Outcome: Progressing   Problem: Skin Integrity: Goal: Risk for impaired skin integrity will decrease Outcome: Progressing

## 2023-07-31 LAB — CULTURE, BLOOD (ROUTINE X 2)
Culture: NO GROWTH
Special Requests: ADEQUATE

## 2023-08-02 ENCOUNTER — Inpatient Hospital Stay (HOSPITAL_BASED_OUTPATIENT_CLINIC_OR_DEPARTMENT_OTHER): Admitting: Internal Medicine

## 2023-08-02 ENCOUNTER — Inpatient Hospital Stay: Attending: Internal Medicine

## 2023-08-02 ENCOUNTER — Encounter: Payer: Self-pay | Admitting: Internal Medicine

## 2023-08-02 ENCOUNTER — Inpatient Hospital Stay

## 2023-08-02 VITALS — BP 130/78 | HR 88 | Temp 98.7°F | Resp 18 | Ht 70.0 in | Wt 201.0 lb

## 2023-08-02 DIAGNOSIS — D500 Iron deficiency anemia secondary to blood loss (chronic): Secondary | ICD-10-CM | POA: Insufficient documentation

## 2023-08-02 DIAGNOSIS — D649 Anemia, unspecified: Secondary | ICD-10-CM | POA: Diagnosis not present

## 2023-08-02 DIAGNOSIS — K922 Gastrointestinal hemorrhage, unspecified: Secondary | ICD-10-CM | POA: Insufficient documentation

## 2023-08-02 LAB — CBC WITH DIFFERENTIAL (CANCER CENTER ONLY)
Abs Immature Granulocytes: 0.07 10*3/uL (ref 0.00–0.07)
Basophils Absolute: 0.1 10*3/uL (ref 0.0–0.1)
Basophils Relative: 1 %
Eosinophils Absolute: 0.2 10*3/uL (ref 0.0–0.5)
Eosinophils Relative: 3 %
HCT: 39.2 % (ref 39.0–52.0)
Hemoglobin: 12.9 g/dL — ABNORMAL LOW (ref 13.0–17.0)
Immature Granulocytes: 1 %
Lymphocytes Relative: 15 %
Lymphs Abs: 1.1 10*3/uL (ref 0.7–4.0)
MCH: 30.7 pg (ref 26.0–34.0)
MCHC: 32.9 g/dL (ref 30.0–36.0)
MCV: 93.3 fL (ref 80.0–100.0)
Monocytes Absolute: 0.9 10*3/uL (ref 0.1–1.0)
Monocytes Relative: 12 %
Neutro Abs: 5.3 10*3/uL (ref 1.7–7.7)
Neutrophils Relative %: 68 %
Platelet Count: 467 10*3/uL — ABNORMAL HIGH (ref 150–400)
RBC: 4.2 MIL/uL — ABNORMAL LOW (ref 4.22–5.81)
RDW: 21.2 % — ABNORMAL HIGH (ref 11.5–15.5)
WBC Count: 7.7 10*3/uL (ref 4.0–10.5)
nRBC: 0 % (ref 0.0–0.2)

## 2023-08-02 LAB — IRON AND TIBC
Iron: 56 ug/dL (ref 45–182)
Saturation Ratios: 21 % (ref 17.9–39.5)
TIBC: 267 ug/dL (ref 250–450)
UIBC: 211 ug/dL

## 2023-08-02 LAB — BASIC METABOLIC PANEL - CANCER CENTER ONLY
Anion gap: 9 (ref 5–15)
BUN: 13 mg/dL (ref 8–23)
CO2: 22 mmol/L (ref 22–32)
Calcium: 8.6 mg/dL — ABNORMAL LOW (ref 8.9–10.3)
Chloride: 102 mmol/L (ref 98–111)
Creatinine: 1.02 mg/dL (ref 0.61–1.24)
GFR, Estimated: >60 mL/min (ref >60)
Glucose, Bld: 87 mg/dL (ref 70–99)
Potassium: 4.3 mmol/L (ref 3.5–5.1)
Sodium: 133 mmol/L — ABNORMAL LOW (ref 135–145)

## 2023-08-02 LAB — FERRITIN: Ferritin: 104 ng/mL (ref 24–336)

## 2023-08-02 NOTE — Progress Notes (Signed)
 Patient had gall bladder surgery recently in [place]. He is supposed to go back and see the surgeon on July 16th. He isn't in any pain.

## 2023-08-02 NOTE — Assessment & Plan Note (Addendum)
#   Anemia- Hb-symptomatic.  Likely due to iron  deficiency - from etiology GI blood loss-hb-  8.6, but still anemia with evidence of iron -deficiency with ferritin 6, serum iron  14, TIBC 441, and iron  sat 3% .    S/p venofer - improved Hb today at 12.9- HOLD venfoer today; continue PO iron .   #Etiology of iron  deficiency: sec GI bleed [colo- FEB 2025- [facility]- [facility]  s/p embolization]- EGD- large hiatal hernia with single Cameron ulcer status post APC, gastritis C-scope showed multiple diverticuli, colon filled with blood with more fresh blood on the left side NM tagged pRBC scan on 2/12 showed the source to be the hepatic flexure. He underwent embolization with Dr. Jama on 2/13- stable; on PO iron  [black stool]  # Biliary obstruction/cholangitis sepsis-moderate: S/p ERCP with biliary sphincterectomy and plastic stent to CBD. Biliary tree was swept and pus was found. Cell cytology obtained. Pathology showed atypical cells favoring reactive inflammatory change. General surgery consulted, s/p lap chole on 07/25/23.  Currently awaiting follow-up at [facility].   # Hx of MG [on MXT- pyridostimine]- stable.  # DISPOSITION: # HOLD venofer  today # follow up 4  months- MD; labs- cbc/bmp; iron  studies; ferritin-possible venofer - Dr.B

## 2023-08-02 NOTE — Progress Notes (Signed)
 [facility] CONSULT NOTE  Patient Care Team: Diedra Lame, MD as PCP - General (Family Medicine) Rennie Cindy SAUNDERS, MD as Consulting Physician (Oncology) Rennie Cindy SAUNDERS, MD as Consulting Physician (Hematology and Oncology)  CHIEF COMPLAINTS/PURPOSE OF CONSULTATION: ANEMIA   HEMATOLOGY HISTORY  # ANEMIA[Hb; MCV-platelets- WBC; Iron  sat; ferritin;  GFR- CT/US - ;   HISTORY OF PRESENTING ILLNESS:  Kyle ONEIDA Dec Sr. 83 y.o.  male pleasant patient PMH of HTN, HLD, ocular MG, GERD, BPH, and COPD presents to the [facility] GI clinic for follow-up of Crohn's colitis in clinical  remission-  GI bleed-in February 2025.  Needing embolization.  with iron  deficiency anemia.  Patient had gall bladder surgery recently in [place]. He is supposed to go back and see the surgeon on July 16th. He isn't in any pain.  In the interim- patient s/p venofer . Patient has been on oral iron .   Denies any weight loss.  Review of Systems  Constitutional:  Positive for malaise/fatigue. Negative for chills, diaphoresis, fever and weight loss.  HENT:  Negative for nosebleeds and sore throat.   Eyes:  Negative for double vision.  Respiratory:  Negative for cough, hemoptysis, sputum production and wheezing.   Cardiovascular:  Negative for chest pain, palpitations, orthopnea and leg swelling.  Gastrointestinal:  Negative for abdominal pain, blood in stool, constipation, diarrhea, heartburn, melena, nausea and vomiting.  Genitourinary:  Negative for dysuria, frequency and urgency.  Musculoskeletal:  Negative for back pain and joint pain.  Skin: Negative.  Negative for itching and rash.  Neurological:  Negative for dizziness, tingling, focal weakness, weakness and headaches.  Endo/Heme/Allergies:  Does not bruise/bleed easily.  Psychiatric/Behavioral:  Negative for depression. The patient is not nervous/anxious and does not have insomnia.      MEDICAL HISTORY:  Past Medical History:   Diagnosis Date   Arthritis    COPD (chronic obstructive pulmonary disease) (HCC)    Crohn's disease (HCC)    Emphysema, unspecified (HCC)    Hypertension    Myasthenia gravis (HCC)     SURGICAL HISTORY: Past Surgical History:  Procedure Laterality Date   BILIARY BRUSHING  07/21/2023   Procedure: BRUSH BIOPSY, BILE DUCT;  Surgeon: Rollin Dover, MD;  Location: [facility] ENDOSCOPY;  Service: Gastroenterology;;   BILIARY STENT PLACEMENT  07/21/2023   Procedure: INSERTION, STENT, BILE DUCT;  Surgeon: Rollin Dover, MD;  Location: [facility] ENDOSCOPY;  Service: Gastroenterology;;   CHOLECYSTECTOMY N/A 07/25/2023   Procedure: LAPAROSCOPIC CHOLECYSTECTOMY;  Surgeon: Ebbie Cough, MD;  Location: [facility] OR;  Service: General;  Laterality: N/A;  WITH ICG DYE   COLONOSCOPY WITH PROPOFOL  N/A 03/20/2023   Procedure: COLONOSCOPY WITH PROPOFOL ;  Surgeon: Jinny Carmine, MD;  Location: [facility] ENDOSCOPY;  Service: Endoscopy;  Laterality: N/A;   EMBOLIZATION (CATH LAB) N/A 03/21/2023   Procedure: EMBOLIZATION;  Surgeon: Jama Cordella MATSU, MD;  Location: [facility] INVASIVE CV LAB;  Service: Cardiovascular;  Laterality: N/A;   ERCP N/A 07/21/2023   Procedure: ERCP, WITH INTERVENTION IF INDICATED;  Surgeon: Rollin Dover, MD;  Location: [facility] ENDOSCOPY;  Service: Gastroenterology;  Laterality: N/A;   ESOPHAGOGASTRODUODENOSCOPY N/A 03/19/2023   Procedure: ESOPHAGOGASTRODUODENOSCOPY (EGD);  Surgeon: Jinny Carmine, MD;  Location: [facility] ENDOSCOPY;  Service: Endoscopy;  Laterality: N/A;   JOINT REPLACEMENT Left    total knee replacement    Dr. Mardee   SPHINCTEROTOMY  07/21/2023   Procedure: ANNETT, GI;  Surgeon: Rollin Dover, MD;  Location: [facility] ENDOSCOPY;  Service: Gastroenterology;;   TONSILLECTOMY     TOTAL KNEE ARTHROPLASTY Right  11/20/2021   Procedure: RIGHT TOTAL KNEE ARTHROPLASTY;  Surgeon: Liam Lerner, MD;  Location: [facility] ORS;  Service: Orthopedics;  Laterality: Right;   WISDOM TOOTH EXTRACTION      SOCIAL  HISTORY: Social History   Socioeconomic History   Marital status: Married    Spouse name: Not on file   Number of children: Not on file   Years of education: Not on file   Highest education level: Not on file  Occupational History   Not on file  Tobacco Use   Smoking status: Former    Current packs/day: 1.50    Types: Cigarettes   Smokeless tobacco: Never  Vaping Use   Vaping status: Never Used  Substance and Sexual Activity   Alcohol use: Not Currently   Drug use: Never   Sexual activity: Not Currently  Other Topics Concern   Not on file  Social History Narrative   Not on file   Social Drivers of Health   Financial Resource Strain: Patient Declined (02/07/2023)   Received from [facility]   Overall Financial Resource Strain (CARDIA)    Difficulty of Paying Living Expenses: Patient declined  Food Insecurity: No Food Insecurity (07/21/2023)   Hunger Vital Sign    Worried About Running Out of Food in the Last Year: Never true    Ran Out of Food in the Last Year: Never true  Transportation Needs: No Transportation Needs (07/21/2023)   PRAPARE - Administrator, Civil Service (Medical): No    Lack of Transportation (Non-Medical): No  Physical Activity: Not on file  Stress: Not on file  Social Connections: Moderately Integrated (07/21/2023)   Social Connection and Isolation Panel    Frequency of Communication with Friends and Family: More than three times a week    Frequency of Social Gatherings with Friends and Family: More than three times a week    Attends Religious Services: More than 4 times per year    Active Member of [employer] or Organizations: No    Attends Banker Meetings: Never    Marital Status: Married  Catering manager Violence: Not At Risk (07/21/2023)   Humiliation, Afraid, Rape, and Kick questionnaire    Fear of Current or Ex-Partner: No    Emotionally Abused: No    Physically Abused: No    Sexually Abused: No     FAMILY HISTORY: Family History  Problem Relation Age of Onset   Brain cancer Father     ALLERGIES:  has no known allergies.  MEDICATIONS:  Current Outpatient Medications  Medication Sig Dispense Refill   acetaminophen  (TYLENOL ) 325 MG tablet Take 650 mg by mouth 3 (three) times daily as needed (pain).     albuterol  (PROVENTIL  HFA;VENTOLIN  HFA) 108 (90 Base) MCG/ACT inhaler Inhale 2 puffs into the lungs every 6 (six) hours as needed for wheezing or shortness of breath. 1 Inhaler 0   aspirin  EC 81 MG tablet Take 81 mg by mouth daily.      Ferrous Sulfate (IRON  PO) Take 1 tablet by mouth daily.     finasteride  (PROSCAR ) 5 MG tablet Take 5 mg by mouth daily.  3   folic acid  (FOLVITE ) 1 MG tablet Take 1 mg by mouth daily.     hydrALAZINE  (APRESOLINE ) 50 MG tablet Take 1 tablet (50 mg total) by mouth every 12 (twelve) hours. 60 tablet 0   methotrexate  (RHEUMATREX) 2.5 MG tablet Take 2 tablets (5 mg total) by mouth once a week. Every Wednesday  Caution:Chemotherapy. Protect from light. 4 tablet 0   Multiple Vitamins-Minerals (MULTIVITAMIN WITH MINERALS) tablet Take 1 tablet by mouth 2 (two) times daily with breakfast and lunch.     Multiple Vitamins-Minerals (PRESERVISION AREDS PO) Take 2 tablets by mouth daily.     Omega-3 Fatty Acids (FISH OIL) 1000 MG CAPS Take 1,000 mg by mouth daily.     omeprazole  (PRILOSEC) 40 MG capsule Take 1 capsule (40 mg total) by mouth 2 (two) times daily before a meal. 60 capsule 0   prednisoLONE  acetate (PRED FORTE ) 1 % ophthalmic suspension Place 1 drop into the left eye at bedtime.     pyridostigmine  (MESTINON ) 60 MG tablet Take 1 tablet (60 mg total) by mouth in the morning and at bedtime. See Rx fill hx and office visit note 01/18/23     tamsulosin  (FLOMAX ) 0.4 MG CAPS capsule Take 0.4 mg by mouth daily.  3   atenolol  (TENORMIN ) 25 MG tablet Take 1 tablet by mouth daily. (Patient not taking: Reported on 08/02/2023)     balsalazide (COLAZAL ) 750 MG  capsule Take 750 mg by mouth 3 (three) times daily.  3   Biotin 1 MG CAPS Take 1 mg by mouth daily.     Cholecalciferol (VITAMIN D3) 50 MCG (2000 UT) TABS Take 2,000 Units by mouth daily.     cyanocobalamin  (VITAMIN B12) 1000 MCG tablet Take 1,000 mcg by mouth daily.     traMADol  (ULTRAM ) 50 MG tablet Take 1 tablet (50 mg total) by mouth every 6 (six) hours as needed. (Patient not taking: Reported on 08/02/2023) 10 tablet 0   No current facility-administered medications for this visit.   Facility-Administered Medications Ordered in Other Visits  Medication Dose Route Frequency Provider Last Rate Last Admin   albuterol  (PROVENTIL ) (2.5 MG/3ML) 0.083% nebulizer solution 2.5 mg  2.5 mg Nebulization Once Verdia Art, MD         PHYSICAL EXAMINATION:   Vitals:   08/02/23 1316 08/02/23 1319  BP: 122/88 130/78  Pulse: 88   Resp: 18   Temp: 98.7 F (37.1 C)   SpO2: 98%     Filed Weights   08/02/23 1316  Weight: 201 lb (91.2 kg)     Physical Exam Vitals and nursing note reviewed.  HENT:     Head: Normocephalic and atraumatic.     Mouth/Throat:     Pharynx: Oropharynx is clear.   Eyes:     Extraocular Movements: Extraocular movements intact.     Pupils: Pupils are equal, round, and reactive to light.    Cardiovascular:     Rate and Rhythm: Normal rate and regular rhythm.  Pulmonary:     Comments: Decreased breath sounds bilaterally.  Abdominal:     Palpations: Abdomen is soft.   Musculoskeletal:        General: Normal range of motion.     Cervical back: Normal range of motion.   Skin:    General: Skin is warm.   Neurological:     General: No focal deficit present.     Mental Status: He is alert and oriented to person, place, and time.   Psychiatric:        Behavior: Behavior normal.        Judgment: Judgment normal.      LABORATORY DATA:  I have reviewed the data as listed Lab Results  Component Value Date   WBC 7.7 08/02/2023   HGB 12.9 (L)  08/02/2023   HCT 39.2 08/02/2023  MCV 93.3 08/02/2023   PLT 467 (H) 08/02/2023   Recent Labs    03/19/23 0927 03/20/23 0258 07/24/23 0800 07/25/23 0520 07/26/23 0305 08/02/23 1251  NA  --    < >  --  132* 131* 133*  K  --    < >  --  3.8 4.3 4.3  CL  --    < >  --  100 102 102  CO2  --    < >  --  20* 19* 22  GLUCOSE  --    < >  --  109* 119* 87  BUN  --    < >  --  10 11 13   CREATININE  --    < >  --  0.91 0.95 1.02  CALCIUM  --    < >  --  8.5* 8.3* 8.6*  GFRNONAA  --    < >  --  >60 >60 >60  PROT 6.4*   < > 6.9 6.6 6.8  --   ALBUMIN 3.3*   < > 2.9* 2.8* 2.8*  --   AST 22   < > 79* 78* 83*  --   ALT 14   < > 128* 114* 111*  --   ALKPHOS 43   < > 240* 226* 210*  --   BILITOT 0.7   < > 2.8* 2.4* 1.8*  --   BILIDIR 0.1  --  1.3*  --   --   --   IBILI 0.6  --  1.5*  --   --   --    < > = values in this interval not displayed.    DG ERCP Result Date: 07/21/2023 CLINICAL DATA:  Acute cholecystitis, biliary duct stricture EXAM: ERCP TECHNIQUE: Multiple spot images obtained with the fluoroscopic device and submitted for interpretation post-procedure. FLUOROSCOPY: Radiation Exposure Index (as provided by the fluoroscopic device): 62.354 mGy Kerma COMPARISON:  MRCP 07/20/2023 FINDINGS: A total of 4 intraoperative saved images are submitted for review. The images demonstrate a flexible duodenal scope in the descending duodenum followed by wire cannulation of the common bile duct. Cholangiography is then performed. Mild intra and extrahepatic biliary ductal dilatation. The cystic duct is patent. The final image demonstrates placement of a plastic biliary stent. IMPRESSION: 1. Mild intra and extrahepatic biliary ductal dilatation with stenosis/occlusion in the mid common bile duct. 2. The cystic duct appears patent. 3. Placement of a plastic biliary stent. These images were submitted for radiologic interpretation only. Please see the procedural report for the amount of contrast and the  fluoroscopy time utilized. Electronically Signed   By: Wilkie Lent M.D.   On: 07/21/2023 11:45   MR ABDOMEN MRCP W WO CONTAST Result Date: 07/20/2023 CLINICAL DATA:  Sepsis.  Elevated bilirubin and transaminitis. EXAM: MRI ABDOMEN WITHOUT AND WITH CONTRAST (INCLUDING MRCP) TECHNIQUE: Multiplanar multisequence MR imaging of the abdomen was performed both before and after the administration of intravenous contrast. Heavily T2-weighted images of the biliary and pancreatic ducts were obtained, and three-dimensional MRCP images were rendered by post processing. CONTRAST:  9mL GADAVIST  GADOBUTROL  1 MMOL/ML IV SOLN COMPARISON:  12/12/2020 FINDINGS: Lower chest: No acute findings. Hepatobiliary: No hepatic masses identified. Tiny gallstones and gallbladder sludge noted. Gallbladder is distended and shows mild diffuse wall thickening, consistent with acute cholecystitis. Mild diffuse biliary ductal dilatation is seen with common bile duct measuring up to 7 mm. Abrupt termination of mid common bile duct is seen just superior to the pancreatic head,  suspicious for stricture. No definite choledocholithiasis. Pancreas: No evidence of pancreatic mass or acute pancreatitis. Mild diffuse pancreatic ductal dilatation is seen with ductal irregularity, suspicious for chronic pancreatitis. Spleen:  Within normal limits in size and appearance. Adrenals/Urinary Tract: No suspicious masses identified. No evidence of hydronephrosis. Stomach/Bowel: Large hiatal hernia is seen. Severe colonic diverticulosis, without signs of diverticulitis involving the visualized portions. Vascular/Lymphatic: No pathologically enlarged lymph nodes identified. 3.1 cm infrarenal abdominal aortic aneurysm. Other:  None. Musculoskeletal:  No suspicious bone lesions identified. IMPRESSION: Findings consistent with acute cholecystitis. Mild diffuse biliary ductal dilatation, with abrupt termination of the mid common bile duct just superior to the  pancreatic head, suspicious for stricture. No definite choledocholithiasis. Mild diffuse pancreatic ductal dilatation with ductal irregularity, suspicious for chronic pancreatitis. No No evidence of pancreatic mass. Large hiatal hernia. Severe colonic diverticulosis. 3.1 cm infrarenal abdominal aortic aneurysm. Recommend follow-up ultrasound every 3 years. (Ref.: J Vasc Surg. 2018; 67:2-77 and J Am Coll Radiol 2013;10(10):789-794.) Electronically Signed   By: Norleen DELENA Kil M.D.   On: 07/20/2023 11:56   MR 3D Recon At Scanner Result Date: 07/20/2023 CLINICAL DATA:  Sepsis.  Elevated bilirubin and transaminitis. EXAM: MRI ABDOMEN WITHOUT AND WITH CONTRAST (INCLUDING MRCP) TECHNIQUE: Multiplanar multisequence MR imaging of the abdomen was performed both before and after the administration of intravenous contrast. Heavily T2-weighted images of the biliary and pancreatic ducts were obtained, and three-dimensional MRCP images were rendered by post processing. CONTRAST:  9mL GADAVIST  GADOBUTROL  1 MMOL/ML IV SOLN COMPARISON:  12/12/2020 FINDINGS: Lower chest: No acute findings. Hepatobiliary: No hepatic masses identified. Tiny gallstones and gallbladder sludge noted. Gallbladder is distended and shows mild diffuse wall thickening, consistent with acute cholecystitis. Mild diffuse biliary ductal dilatation is seen with common bile duct measuring up to 7 mm. Abrupt termination of mid common bile duct is seen just superior to the pancreatic head, suspicious for stricture. No definite choledocholithiasis. Pancreas: No evidence of pancreatic mass or acute pancreatitis. Mild diffuse pancreatic ductal dilatation is seen with ductal irregularity, suspicious for chronic pancreatitis. Spleen:  Within normal limits in size and appearance. Adrenals/Urinary Tract: No suspicious masses identified. No evidence of hydronephrosis. Stomach/Bowel: Large hiatal hernia is seen. Severe colonic diverticulosis, without signs of diverticulitis  involving the visualized portions. Vascular/Lymphatic: No pathologically enlarged lymph nodes identified. 3.1 cm infrarenal abdominal aortic aneurysm. Other:  None. Musculoskeletal:  No suspicious bone lesions identified. IMPRESSION: Findings consistent with acute cholecystitis. Mild diffuse biliary ductal dilatation, with abrupt termination of the mid common bile duct just superior to the pancreatic head, suspicious for stricture. No definite choledocholithiasis. Mild diffuse pancreatic ductal dilatation with ductal irregularity, suspicious for chronic pancreatitis. No No evidence of pancreatic mass. Large hiatal hernia. Severe colonic diverticulosis. 3.1 cm infrarenal abdominal aortic aneurysm. Recommend follow-up ultrasound every 3 years. (Ref.: J Vasc Surg. 2018; 67:2-77 and J Am Coll Radiol 2013;10(10):789-794.) Electronically Signed   By: Norleen DELENA Kil M.D.   On: 07/20/2023 11:56   US  ABDOMEN LIMITED RUQ (LIVER/GB) Result Date: 07/20/2023 CLINICAL DATA:  [id number] Transaminitis [id number] EXAM: ULTRASOUND ABDOMEN LIMITED RIGHT UPPER QUADRANT COMPARISON:  March 19, 2023, February 14, 2023 FINDINGS: Gallbladder: Multiple cholelithiasis and layering sludge. No wall thickening visualized when remeasured; wall thickness appears the same compared to prior ultrasound. No sonographic Murphy sign noted by sonographer. Common bile duct: Diameter: Visualized portion measures 4 mm, within normal limits. Liver: No focal lesion identified. Within normal limits in parenchymal echogenicity. Portal vein is patent on color Doppler imaging with normal direction  of blood flow towards the liver. Other: None. IMPRESSION: Cholelithiasis without sonographic evidence of acute cholecystitis. Electronically Signed   By: Corean Salter M.D.   On: 07/20/2023 08:18   DG Chest Portable 1 View Result Date: 07/20/2023 CLINICAL DATA:  weakness, leukocytosis, eval for pna EXAM: PORTABLE CHEST 1 VIEW COMPARISON:  November 07, 2021, December 28, 2017 FINDINGS: Evaluation is limited by rotation. The cardiomediastinal silhouette is unchanged in contour. No pleural effusion. No pneumothorax. Rounded LEFT retrocardiac opacity consistent with a hiatal hernia. Similar biapical scarring with bullous changes. No acute pleuroparenchymal abnormality. IMPRESSION: No acute cardiopulmonary abnormality. Electronically Signed   By: Corean Salter M.D.   On: 07/20/2023 08:16   CT Head Wo Contrast Result Date: 07/20/2023 EXAM: CT HEAD WITHOUT CONTRAST 07/20/2023 07:17:00 AM TECHNIQUE: CT of the head was performed without the administration of intravenous contrast. Automated exposure control, iterative reconstruction, and/or weight based adjustment of the mA/kV was utilized to reduce the radiation dose to as low as reasonably achievable. COMPARISON: CT head without contrast and MR head without contrast 12/28/2017. CLINICAL HISTORY: Mental status change, unknown cause. BIB EMS for lethargy and weakness. Upon arrival, patient complains of nausea. Patient was 90% on room air on their arrival to the home. Patient on [facility] on arrival. Not normally on O2. FINDINGS: BRAIN AND VENTRICLES: There is no acute intracranial hemorrhage, mass effect or midline shift. No abnormal extra-axial fluid collection. The gray-white differentiation is maintained without an acute infarct. There is no hydrocephalus. Mild generalized atrophy and periventricular white matter disease demonstrate some progression since the prior study. ORBITS: Bilateral lens replacements are noted. The globes and orbits are otherwise within normal limits. SINUSES: The visualized paranasal sinuses and mastoid air cells demonstrate no acute abnormality. SOFT TISSUES AND SKULL: No acute abnormality of the visualized skull or soft tissues. IMPRESSION: 1. No acute intracranial abnormality. 2. Mild generalized atrophy and periventricular white matter disease with some progression since the prior study. Electronically  signed by: Lonni Necessary MD 07/20/2023 07:29 AM EDT RP Workstation: [id number]    ASSESSMENT & PLAN:   Symptomatic anemia # Anemia- Hb-symptomatic.  Likely due to iron  deficiency - from etiology GI blood loss-hb-  8.6, but still anemia with evidence of iron -deficiency with ferritin 6, serum iron  14, TIBC 441, and iron  sat 3% .    S/p venofer - improved Hb today at 12.9- HOLD venfoer today; continue PO iron .   #Etiology of iron  deficiency: sec GI bleed [colo- FEB 2025- [facility]- [facility]  s/p embolization]- EGD- large hiatal hernia with single Cameron ulcer status post APC, gastritis C-scope showed multiple diverticuli, colon filled with blood with more fresh blood on the left side NM tagged pRBC scan on 2/12 showed the source to be the hepatic flexure. He underwent embolization with Dr. Jama on 2/13- stable; on PO iron  [black stool]  # Biliary obstruction/cholangitis sepsis-moderate: S/p ERCP with biliary sphincterectomy and plastic stent to CBD. Biliary tree was swept and pus was found. Cell cytology obtained. Pathology showed atypical cells favoring reactive inflammatory change. General surgery consulted, s/p lap chole on 07/25/23.  Currently awaiting follow-up at [facility].   # Hx of MG [on MXT- pyridostimine]- stable.  # DISPOSITION: # HOLD venofer  today # follow up 4  months- MD; labs- cbc/bmp; iron  studies; ferritin-possible venofer - Dr.B   All questions were answered. The patient knows to call the clinic with any problems, questions or concerns.    Cindy JONELLE Joe, MD 08/02/2023 1:46 PM

## 2023-09-09 ENCOUNTER — Other Ambulatory Visit: Payer: Self-pay | Admitting: Gastroenterology

## 2023-09-25 ENCOUNTER — Encounter (HOSPITAL_COMMUNITY): Payer: Self-pay | Admitting: Gastroenterology

## 2023-09-25 NOTE — Progress Notes (Signed)
Attempted to obtain medical history via telephone, unable to reach at this time. Unable to leave voicemail to return pre surgical testing department's phone call,due to mailbox full.  

## 2023-10-04 ENCOUNTER — Ambulatory Visit (HOSPITAL_COMMUNITY)

## 2023-10-04 ENCOUNTER — Ambulatory Visit (HOSPITAL_COMMUNITY): Admitting: Anesthesiology

## 2023-10-04 ENCOUNTER — Encounter (HOSPITAL_COMMUNITY): Payer: Self-pay | Admitting: Gastroenterology

## 2023-10-04 ENCOUNTER — Other Ambulatory Visit: Payer: Self-pay

## 2023-10-04 ENCOUNTER — Ambulatory Visit (HOSPITAL_COMMUNITY)
Admission: RE | Admit: 2023-10-04 | Discharge: 2023-10-04 | Disposition: A | Discharge status: Home / Self Care | Attending: Gastroenterology | Admitting: Gastroenterology

## 2023-10-04 ENCOUNTER — Encounter (HOSPITAL_COMMUNITY)
Admission: RE | Disposition: A | Discharge status: Home / Self Care | Payer: Self-pay | Source: Home / Self Care | Attending: Gastroenterology

## 2023-10-04 DIAGNOSIS — Z79899 Other long term (current) drug therapy: Secondary | ICD-10-CM | POA: Insufficient documentation

## 2023-10-04 DIAGNOSIS — K831 Obstruction of bile duct: Secondary | ICD-10-CM | POA: Diagnosis present

## 2023-10-04 DIAGNOSIS — K219 Gastro-esophageal reflux disease without esophagitis: Secondary | ICD-10-CM | POA: Diagnosis not present

## 2023-10-04 DIAGNOSIS — G709 Myoneural disorder, unspecified: Secondary | ICD-10-CM | POA: Diagnosis not present

## 2023-10-04 DIAGNOSIS — I10 Essential (primary) hypertension: Secondary | ICD-10-CM | POA: Insufficient documentation

## 2023-10-04 DIAGNOSIS — J449 Chronic obstructive pulmonary disease, unspecified: Secondary | ICD-10-CM

## 2023-10-04 DIAGNOSIS — Z87891 Personal history of nicotine dependence: Secondary | ICD-10-CM

## 2023-10-04 DIAGNOSIS — K5090 Crohn's disease, unspecified, without complications: Secondary | ICD-10-CM | POA: Diagnosis not present

## 2023-10-04 DIAGNOSIS — K838 Other specified diseases of biliary tract: Secondary | ICD-10-CM | POA: Insufficient documentation

## 2023-10-04 DIAGNOSIS — T85590A Other mechanical complication of bile duct prosthesis, initial encounter: Secondary | ICD-10-CM | POA: Insufficient documentation

## 2023-10-04 DIAGNOSIS — K449 Diaphragmatic hernia without obstruction or gangrene: Secondary | ICD-10-CM | POA: Diagnosis not present

## 2023-10-04 DIAGNOSIS — Y738 Miscellaneous gastroenterology and urology devices associated with adverse incidents, not elsewhere classified: Secondary | ICD-10-CM | POA: Diagnosis not present

## 2023-10-04 DIAGNOSIS — M1990 Unspecified osteoarthritis, unspecified site: Secondary | ICD-10-CM | POA: Diagnosis not present

## 2023-10-04 HISTORY — PX: ERCP: SHX5425

## 2023-10-04 HISTORY — PX: STENT REMOVAL: SHX6421

## 2023-10-04 HISTORY — PX: EUS: SHX5427

## 2023-10-04 HISTORY — PX: ESOPHAGOGASTRODUODENOSCOPY: SHX5428

## 2023-10-04 HISTORY — PX: BILIARY STENT PLACEMENT: SHX5538

## 2023-10-04 HISTORY — PX: BILIARY BRUSHING: SHX6843

## 2023-10-04 SURGERY — ULTRASOUND, UPPER GI TRACT, ENDOSCOPIC
Anesthesia: Monitor Anesthesia Care

## 2023-10-04 MED ORDER — DICLOFENAC SUPPOSITORY 100 MG
RECTAL | Status: AC
  Filled 2023-10-04: qty 1

## 2023-10-04 MED ORDER — PROPOFOL 10 MG/ML IV BOLUS
INTRAVENOUS | Status: DC | PRN
  Administered 2023-10-04: 130 mg via INTRAVENOUS

## 2023-10-04 MED ORDER — GLUCAGON HCL RDNA (DIAGNOSTIC) 1 MG IJ SOLR
INTRAMUSCULAR | Status: AC
  Filled 2023-10-04: qty 2

## 2023-10-04 MED ORDER — SODIUM CHLORIDE 0.9 % IV SOLN
INTRAVENOUS | Status: AC | PRN
  Administered 2023-10-04: 500 mL via INTRAMUSCULAR

## 2023-10-04 MED ORDER — ROCURONIUM BROMIDE 10 MG/ML (PF) SYRINGE
PREFILLED_SYRINGE | INTRAVENOUS | Status: DC | PRN
  Administered 2023-10-04: 50 mg via INTRAVENOUS

## 2023-10-04 MED ORDER — CIPROFLOXACIN IN D5W 400 MG/200ML IV SOLN
INTRAVENOUS | Status: DC | PRN
  Administered 2023-10-04: 400 mg via INTRAVENOUS

## 2023-10-04 MED ORDER — FENTANYL CITRATE (PF) 100 MCG/2ML IJ SOLN
INTRAMUSCULAR | Status: DC | PRN
  Administered 2023-10-04 (×2): 50 ug via INTRAVENOUS

## 2023-10-04 MED ORDER — SODIUM CHLORIDE 0.9 % IV SOLN: INTRAVENOUS | Status: DC

## 2023-10-04 MED ORDER — DEXAMETHASONE SODIUM PHOSPHATE 10 MG/ML IJ SOLN
INTRAMUSCULAR | Status: DC | PRN
  Administered 2023-10-04: 10 mg via INTRAVENOUS

## 2023-10-04 MED ORDER — GLUCAGON HCL RDNA (DIAGNOSTIC) 1 MG IJ SOLR
INTRAMUSCULAR | Status: DC | PRN
  Administered 2023-10-04: .5 mg via INTRAVENOUS

## 2023-10-04 MED ORDER — SUGAMMADEX SODIUM 200 MG/2ML IV SOLN
INTRAVENOUS | Status: DC | PRN
  Administered 2023-10-04: 200 mg via INTRAVENOUS

## 2023-10-04 MED ORDER — CIPROFLOXACIN IN D5W 400 MG/200ML IV SOLN
INTRAVENOUS | Status: AC
  Filled 2023-10-04: qty 200

## 2023-10-04 MED ORDER — PHENYLEPHRINE 80 MCG/ML (10ML) SYRINGE FOR IV PUSH (FOR BLOOD PRESSURE SUPPORT)
PREFILLED_SYRINGE | INTRAVENOUS | Status: DC | PRN
  Administered 2023-10-04 (×3): 160 ug via INTRAVENOUS

## 2023-10-04 MED ORDER — FENTANYL CITRATE (PF) 100 MCG/2ML IJ SOLN
INTRAMUSCULAR | Status: AC
Start: 2023-10-04 — End: 2023-10-04
  Filled 2023-10-04: qty 2

## 2023-10-04 MED ORDER — ONDANSETRON HCL 4 MG/2ML IJ SOLN
INTRAMUSCULAR | Status: DC | PRN
Start: 2023-10-04 — End: 2023-10-04
  Administered 2023-10-04: 4 mg via INTRAVENOUS

## 2023-10-04 MED ORDER — SODIUM CHLORIDE 0.9 % IV SOLN
INTRAVENOUS | Status: DC | PRN
  Administered 2023-10-04: 25 mL

## 2023-10-04 MED ORDER — DICLOFENAC SUPPOSITORY 100 MG
RECTAL | Status: DC | PRN
  Administered 2023-10-04: 100 mg via RECTAL

## 2023-10-04 MED ORDER — PROPOFOL 1000 MG/100ML IV EMUL
INTRAVENOUS | Status: AC
  Filled 2023-10-04: qty 100

## 2023-10-04 MED ORDER — LIDOCAINE 2% (20 MG/ML) 5 ML SYRINGE
INTRAMUSCULAR | Status: DC | PRN
  Administered 2023-10-04: 60 mg via INTRAVENOUS

## 2023-10-04 NOTE — Discharge Instructions (Signed)
YOU HAD AN ENDOSCOPIC PROCEDURE TODAY: Refer to the procedure report and other information in the discharge instructions given to you for any specific questions about what was found during the examination. If this information does not answer your questions, please call [facility] at [phone] to clarify.   YOU SHOULD EXPECT: Some feelings of bloating in the abdomen. Passage of more gas than usual. Walking can help get rid of the air that was put into your GI tract during the procedure and reduce the bloating. Some abdominal soreness may be present for a day or two, also.  DIET: Your first meal following the procedure should be a light meal and then it is ok to progress to your normal diet. A half-sandwich or bowl of soup is an example of a good first meal. Heavy or fried foods are harder to digest and may make you feel nauseous or bloated. Drink plenty of fluids but you should avoid alcoholic beverages for 24 hours.   ACTIVITY: Your care partner should take you home directly after the procedure. You should plan to take it easy, moving slowly for the rest of the day. You can resume normal activity the day after the procedure however YOU SHOULD NOT DRIVE, use power tools, machinery or perform tasks that involve climbing or major physical exertion for 24 hours (because of the sedation medicines used during the test).   SYMPTOMS TO REPORT IMMEDIATELY: A gastroenterologist can be reached at any hour. Please call [phone]  for any of the following symptoms:   Following upper endoscopy (EGD, EUS, ERCP, esophageal dilation) Vomiting of blood or coffee ground material  New, significant abdominal pain  New, significant chest pain or pain under the shoulder blades  Painful or persistently difficult swallowing  New shortness of breath  Black, tarry-looking or red, bloody stools  FOLLOW UP:  If any biopsies were taken you will be contacted by phone or by letter within the next 1-3 weeks. Call  [phone]  if you have not heard about the biopsies in 3 weeks.  Please also call with any specific questions about appointments or follow up tests.  

## 2023-10-04 NOTE — Op Note (Signed)
 [facility] Patient Name: Kyle Holloway Procedure Date: 10/04/2023 MRN: [id number] Attending MD: Belvie Just , MD, [id number] Date of Birth: November 06, 1940 CSN: [id number] Age: 83 Admit Type: Outpatient Procedure:                Upper EUS Indications:              Common bile duct dilation (etiology unknown) seen                            on CT scan Providers:                Belvie Just, MD, Ozell Pouch, Haskel Chris, Technician Referring MD:             Belvie Just, MD Medicines:                General Anesthesia Complications:            No immediate complications. Estimated Blood Loss:     Estimated blood loss: none. Procedure:                Pre-Anesthesia Assessment:                           - Prior to the procedure, a History and Physical                            was performed, and patient medications and                            allergies were reviewed. The patient's tolerance of                            previous anesthesia was also reviewed. The risks                            and benefits of the procedure and the sedation                            options and risks were discussed with the patient.                            All questions were answered, and informed consent                            was obtained. Prior Anticoagulants: The patient has                            taken no anticoagulant or antiplatelet agents. ASA                            Grade Assessment: III - A patient with severe  systemic disease. After reviewing the risks and                            benefits, the patient was deemed in satisfactory                            condition to undergo the procedure.                           - Sedation was administered by an anesthesia                            professional. General anesthesia was attained.                           After obtaining informed consent, the  endoscope was                            passed under direct vision. Throughout the                            procedure, the patient's blood pressure, pulse, and                            oxygen saturations were monitored continuously. The                            [id number] ([id number] Olympus endosonoscope was                            introduced through the mouth, and advanced to the                            second part of duodenum. The upper EUS was                            technically difficult and complex. The patient                            tolerated the procedure well. Scope In: Scope Out: Findings:      ENDOSCOPIC FINDING: :      A large hiatal hernia was present.      ENDOSONOGRAPHIC FINDING: :      Moderate hyperechoic material consistent with sludge was visualized       endosonographically in the common bile duct.      There was no sign of significant endosonographic abnormality in the       entire pancreas. The pancreatic duct measured up to 3 mm in diameter. No       masses, no cysts, the pancreatic duct was well visualized from ampulla       to tail, the pancreatic duct was thin in caliber, the pancreatic duct       was regular in contour.      There was no evidence of a pancreatic head mass. Sludge was noted in the       CBD.  The biliary stent was intact. Impression:               - Large hiatal hernia.                           - Hyperechoic material consistent with sludge was                            visualized endosonographically in the common bile                            duct.                           - There was no sign of significant pathology in the                            entire pancreas.                           - No specimens collected. Moderate Sedation:      Not Applicable - Patient had care per Anesthesia. Recommendation:           - Proceed with the ERCP. Procedure Code(s):        --- Professional ---                           [phone],  Esophagogastroduodenoscopy, flexible,                            transoral; with endoscopic ultrasound examination                            limited to the esophagus, stomach or duodenum, and                            adjacent structures Diagnosis Code(s):        --- Professional ---                           K83.8, Other specified diseases of biliary tract                           K44.9, Diaphragmatic hernia without obstruction or                            gangrene                           R93.2, Abnormal findings on diagnostic imaging of                            liver and biliary tract CPT copyright 2022 American [employer] Association. All rights reserved. The codes documented in this report are preliminary and upon coder review may  be revised to meet current compliance requirements. Belvie Just, MD Belvie Just, MD 10/04/2023 10:35:21 AM This report has been signed electronically. Number of Addenda: 0

## 2023-10-04 NOTE — Op Note (Signed)
 [facility] Patient Name: Kyle Holloway Procedure Date: 10/04/2023 MRN: [id number] Attending MD: Belvie Just , MD, [id number] Date of Birth: [birth year] CSN: [id number] Age: 83 Admit Type: Outpatient Procedure:                ERCP Indications:              Common bile duct stricture Providers:                Belvie Just, MD, Ozell Pouch, Haskel Chris, Technician Referring MD:             Belvie Just, MD Medicines:                General Anesthesia Complications:            No immediate complications. Estimated Blood Loss:     Estimated blood loss: none. Procedure:                Pre-Anesthesia Assessment:                           - Prior to the procedure, a History and Physical                            was performed, and patient medications and                            allergies were reviewed. The patient's tolerance of                            previous anesthesia was also reviewed. The risks                            and benefits of the procedure and the sedation                            options and risks were discussed with the patient.                            All questions were answered, and informed consent                            was obtained. Prior Anticoagulants: The patient has                            taken no anticoagulant or antiplatelet agents. ASA                            Grade Assessment: III - A patient with severe                            systemic disease. After reviewing the risks and  benefits, the patient was deemed in satisfactory                            condition to undergo the procedure.                           - Sedation was administered by an anesthesia                            professional. General anesthesia was attained.                           After obtaining informed consent, the scope was                            passed under direct vision.  Throughout the                            procedure, the patient's blood pressure, pulse, and                            oxygen saturations were monitored continuously. The                            [id number]-Q190V ([id number]) Olympus duodenoscope was                            introduced through the mouth, and used to inject                            contrast into and used to inject contrast into the                            bile duct. The ERCP was accomplished without                            difficulty. The patient tolerated the procedure                            well. Scope In: Scope Out: Findings:      One plastic stent originating in the common bile duct was emerging from       the major papilla. The stent was visibly occluded. A short 0.035 inch       Soft Jagwire was passed into the biliary tree. A 3 mm biliary       sphincterotomy was made with a monofilament traction (standard)       sphincterotome using ERBE electrocautery. There was no       post-sphincterotomy bleeding. The biliary tree was swept with a 9 mm       balloon starting at the bifurcation. Sludge was swept from the duct.       Cells for cytology were obtained by brushing in the lower third of the       main bile duct. The common bile duct contained one plastic stent. This       was found  to be visibly occluded. The stent was removed using a snare. A       7 Fr by 5 cm plastic stent with a single external flap and a single       internal flap was placed 5 cm into the biliary tree. Bile flowed through       the stent. The stent was in good position.      There was some difficulty passing the duodenoscope through the hiatal       hernia and into the duodenum. Gentle forward pressure and scope rotation       allowed for the safe passage of the duodeoscope. The occluded plastic       biliary stent was found and it was out of position. A guidewire was       passed along side of the stent and secured in the right  intrahepatic       ducts. The stent was removed with a snare. 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 The stricture was brushed again. Balloon sweep       with the 9 mm balloon allowed for removal of sludge and some medical       material. Presumably this was from the recent cholecystectomy. At the 9       mm diameter, the balloon did encounter resistance in the distal CBD. Two       7 Fr x 5 cm plastic biliary stent were placed and excellent drainage was       achieved. Fluoroscopic images confirmed placement of the stents. Impression:               - One visibly occluded stent from the common bile                            duct was seen in the major papilla.                           - A biliary sphincterotomy was performed.                           - The biliary tree was swept and sludge was found.                           - Cells for cytology obtained in the lower third of                            the main duct.                           - One stent was exchanged in the biliary tree. Moderate Sedation:      Not Applicable - Patient had care per Anesthesia. Recommendation:           - Patient has a contact number available for                            emergencies. The signs and symptoms of potential  delayed complications were discussed with the                            patient. Return to normal activities tomorrow.                            Written discharge instructions were provided to the                            patient. Procedure Code(s):        --- Professional ---                           [phone], Endoscopic retrograde                            cholangiopancreatography (ERCP); with removal and                            exchange of stent(s), biliary or pancreatic duct,                            including pre- and post-dilation and guide wire                             passage, when performed, including sphincterotomy,                            when performed, each stent exchanged                           [id number], Endoscopic retrograde                            cholangiopancreatography (ERCP); with removal of                            calculi/debris from biliary/pancreatic duct(s) Diagnosis Code(s):        --- Professional ---                           U14.409J, Other mechanical complication of bile                            duct prosthesis, initial encounter                           K83.1, Obstruction of bile duct CPT copyright 2022 American Medical Association. All rights reserved. The codes documented in this report are preliminary and upon coder review may  be revised to meet current compliance requirements. Belvie Just, MD Belvie Just, MD 10/04/2023 10:45:18 AM This report has been signed electronically. Number of Addenda: 0

## 2023-10-04 NOTE — H&P (Signed)
 [name] 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  The etiology of the stricture was not identified and an EUS with ERCP is planned for today.  Past Medical History:  Diagnosis Date   Arthritis    COPD (chronic obstructive pulmonary disease) (HCC)    Crohn's disease (HCC)    Emphysema, unspecified (HCC)    Hypertension    Myasthenia gravis (HCC)     Past Surgical History:  Procedure Laterality Date   BILIARY BRUSHING  07/21/2023   Procedure: BRUSH BIOPSY, BILE DUCT;  Surgeon: Rollin Dover, MD;  Location: [facility] ENDOSCOPY;  Service: Gastroenterology;;   BILIARY STENT PLACEMENT  07/21/2023   Procedure: INSERTION, STENT, BILE DUCT;  Surgeon: Rollin Dover, MD;  Location: [facility] ENDOSCOPY;  Service: Gastroenterology;;   CHOLECYSTECTOMY N/A 07/25/2023   Procedure: LAPAROSCOPIC CHOLECYSTECTOMY;  Surgeon: Ebbie Cough, MD;  Location: [facility] OR;  Service: General;  Laterality: N/A;  WITH ICG DYE   COLONOSCOPY WITH PROPOFOL  N/A 03/20/2023   Procedure: COLONOSCOPY WITH PROPOFOL ;  Surgeon: Jinny Carmine, MD;  Location: [facility] ENDOSCOPY;  Service: Endoscopy;  Laterality: N/A;   EMBOLIZATION (CATH LAB) N/A 03/21/2023   Procedure: EMBOLIZATION;  Surgeon: Jama Cordella MATSU, MD;  Location: [facility] INVASIVE CV LAB;  Service: Cardiovascular;  Laterality: N/A;   ERCP N/A 07/21/2023   Procedure: ERCP, WITH INTERVENTION IF INDICATED;  Surgeon: Rollin Dover, MD;  Location: [facility] ENDOSCOPY;  Service: Gastroenterology;  Laterality: N/A;   ESOPHAGOGASTRODUODENOSCOPY N/A 03/19/2023   Procedure: ESOPHAGOGASTRODUODENOSCOPY (EGD);  Surgeon: Jinny Carmine, MD;  Location: [facility] ENDOSCOPY;  Service: Endoscopy;  Laterality: N/A;   JOINT REPLACEMENT Left    total knee replacement    Dr. 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 His smoking use included cigarettes. He has never used smokeless tobacco. He reports that he does not currently use alcohol. He reports that he does not use drugs.  Allergies: No Known Allergies  Medications: Scheduled: Continuous:  sodium chloride      sodium chloride  500 mL (10/04/23 0812)   sodium chloride       No results found for this or any previous visit (from the past 24 hours).   No results found.  ROS:  As stated above in the HPI otherwise negative.  Blood pressure 126/67, pulse 77, temperature 97.6 F (36.4 C), temperature source Temporal, resp. rate 17, height 5' 10 (1.778 m), weight 88.5 kg, SpO2 95%.    PE: Gen: NAD, Alert and Oriented HEENT:  [place]/AT, EOMI Neck: Supple, no LAD Lungs: CTA Bilaterally CV: RRR without M/G/R ABD: Soft, NTND, +BS Ext: No C/C/E  Assessment/Plan: 1) Biliary stricture - EUS with possible FNA and ERCP with stent exchange.  Charlyn Vialpando D 10/04/2023, 8:37 AM

## 2023-10-04 NOTE — Anesthesia Procedure Notes (Signed)
 Procedure Name: Intubation Date/Time: 10/04/2023 9:07 AM  Performed by: Henchy Mccauley D, CRNAPre-anesthesia Checklist: Patient identified, Emergency Drugs available, Suction available and Patient being monitored Patient Re-evaluated:Patient Re-evaluated prior to induction Oxygen Delivery Method: Circle system utilized Preoxygenation: Pre-oxygenation with 100% oxygen Induction Type: IV induction Ventilation: Mask ventilation without difficulty Laryngoscope Size: Mac and 4 Grade View: Grade I Tube type: Oral Tube size: 7.5 mm Number of attempts: 1 Airway Equipment and Method: Stylet and Oral airway Placement Confirmation: ETT inserted through vocal cords under direct vision, positive ETCO2 and breath sounds checked- equal and bilateral Secured at: 22 cm Tube secured with: Tape Dental Injury: Teeth and Oropharynx as per pre-operative assessment

## 2023-10-04 NOTE — Transfer of Care (Signed)
 Immediate Anesthesia Transfer of Care Note  Patient: Kyle ONEIDA Dec Sr.  Procedure(s) Performed: ULTRASOUND, UPPER GI TRACT, ENDOSCOPIC ERCP, WITH INTERVENTION IF INDICATED EGD (ESOPHAGOGASTRODUODENOSCOPY) STENT REMOVAL INSERTION, STENT, BILE DUCT BRUSH BIOPSY, BILE DUCT  Patient Location: PACU  Anesthesia Type:General  Level of Consciousness: awake, alert , and oriented  Airway & Oxygen Therapy: Patient Spontanous Breathing and Patient connected to face mask oxygen  Post-op Assessment: Report given to RN and Post -op Vital signs reviewed and stable  Post vital signs: Reviewed and stable  Last Vitals:  Vitals Value Taken Time  BP    Temp    Pulse    Resp    SpO2      Last Pain:  Vitals:   10/04/23 0802  TempSrc: Temporal  PainSc: 0-No pain         Complications: No notable events documented.

## 2023-10-04 NOTE — Anesthesia Preprocedure Evaluation (Addendum)
 Anesthesia Evaluation  Patient identified by MRN, date of birth, ID band Patient awake    Reviewed: Allergy & Precautions, NPO status , Patient's Chart, lab work & pertinent test results  History of Anesthesia Complications Negative for: history of anesthetic complications  Airway Mallampati: I  TM Distance: >3 FB Neck ROM: Full    Dental  (+) Lower Dentures, Upper Dentures   Pulmonary COPD,  COPD inhaler, former smoker   Pulmonary exam normal        Cardiovascular hypertension, Pt. on medications Normal cardiovascular exam     Neuro/Psych  Neuromuscular disease (MG)  negative psych ROS   GI/Hepatic Neg liver ROS,GERD  Controlled and Medicated,, Crohn's disease    Endo/Other  negative endocrine ROS    Renal/GU negative Renal ROS     Musculoskeletal  (+) Arthritis ,    Abdominal   Peds  Hematology negative hematology ROS (+)   Anesthesia Other Findings   Reproductive/Obstetrics                              Anesthesia Physical Anesthesia Plan  ASA: 3  Anesthesia Plan: MAC   Post-op Pain Management:    Induction: Intravenous  PONV Risk Score and Plan: 1 and Treatment may vary due to age or medical condition and Propofol  infusion  Airway Management Planned: Nasal Cannula and Natural Airway  Additional Equipment: None  Intra-op Plan:   Post-operative Plan: Extubation in OR  Informed Consent: I have reviewed the patients History and Physical, chart, labs and discussed the procedure including the risks, benefits and alternatives for the proposed anesthesia with the patient or authorized representative who has indicated his/her understanding and acceptance.       Plan Discussed with: CRNA and Anesthesiologist  Anesthesia Plan Comments:          Anesthesia Quick Evaluation

## 2023-10-08 ENCOUNTER — Encounter (HOSPITAL_COMMUNITY): Payer: Self-pay | Admitting: Gastroenterology

## 2023-10-08 LAB — CYTOLOGY - NON PAP

## 2023-10-08 NOTE — Anesthesia Postprocedure Evaluation (Signed)
 Anesthesia Post Note  Patient: Kyle ONEIDA Dec Sr.  Procedure(s) Performed: ULTRASOUND, UPPER GI TRACT, ENDOSCOPIC ERCP, WITH INTERVENTION IF INDICATED EGD (ESOPHAGOGASTRODUODENOSCOPY) STENT REMOVAL INSERTION, STENT, BILE DUCT BRUSH BIOPSY, BILE DUCT     Patient location during evaluation: PACU Anesthesia Type: General Level of consciousness: awake and alert Pain management: pain level controlled Vital Signs Assessment: post-procedure vital signs reviewed and stable Respiratory status: spontaneous breathing, nonlabored ventilation and respiratory function stable Cardiovascular status: stable and blood pressure returned to baseline Anesthetic complications: no   No notable events documented.  Last Vitals:  Vitals:   10/04/23 1130 10/04/23 1141  BP: 100/65 102/62  Pulse: (!) 58 (!) 58  Resp: 18 19  Temp:    SpO2: 92% 95%    Last Pain:  Vitals:   10/04/23 1141  TempSrc:   PainSc: 0-No pain                 Debby FORBES Like

## 2023-10-15 ENCOUNTER — Inpatient Hospital Stay: Admitting: Infectious Diseases

## 2023-10-17 NOTE — Progress Notes (Signed)
 Kyle Holloway is a 83 y.o. male who is here for an acute visit.  He has had gross hematuria for the last 5 days but it seems to have cleared up today. He describes it as brownish in color rather than bright red. He denies fever or chills. He denies dysuria.  He has been having episodes of feeling short of breath. It tends to be when he exerts himself. He has noticed that his blood pressure has been elevated at times. He is taking atenolol  and hydralazine .   Patient Active Problem List  Diagnosis  . Hyperlipidemia LDL goal <130  . Essential hypertension, benign  . Crohn's colitis (CMS/HHS-HCC)  . Ocular myasthenia gravis (CMS/HHS-HCC)  . GERD (gastroesophageal reflux disease)  . Benign non-nodular prostatic hyperplasia with lower urinary tract symptoms  . MG (myasthenia gravis) (CMS/HHS-HCC)  . Chronic obstructive pulmonary disease (CMS/HHS-HCC)  . Diaphragmatic hernia  . Diverticulosis of colon  . Encounter for fitting and adjustment of hearing aid  . Osteoarthritis of right knee  . Crohn's disease (CMS/HHS-HCC)  . Seborrheic dermatitis    Past Medical History:  Diagnosis Date  . Back pain   . BPH (benign prostatic hypertrophy)   . Crohn's colitis (CMS/HHS-HCC)   . Crohn's colitis (CMS/HHS-HCC)   . Dysphagia, unspecified(787.20)   . Esophageal stricture   . Essential hypertension, benign   . GERD (gastroesophageal reflux disease)   . History of GI bleed    Significant GI bleed May 2014 requiring transfusion 2 units of packed red blood cells.  Evaluation in progress.  . Ocular myasthenia gravis (CMS/HHS-HCC)    Acetyl Choline Receptor Blocking (41, strongly +ve), Acetyl Choline Receptor Binding Ab (13.9, strongly +ve), Neg CT chest for thymoma  . Osteoarthritis    knees  . Osteoarthrosis, unspecified whether generalized or localized, lower leg   . Other and unspecified hyperlipidemia      Current Outpatient Medications:  .  acetaminophen  (TYLENOL ) 500 MG tablet, Take 500 mg by  mouth every 6 (six) hours as needed for Pain., Disp: , Rfl:  .  albuterol  90 mcg/actuation inhaler, INHALE 2 PUFFS EVERY 6 HOURS AS NEEDED FOR SHORTNESS OF BREATH, Disp: 1 each, Rfl: 2 .  aspirin  81 MG EC tablet, Take 81 mg by mouth once daily., Disp: , Rfl:  .  atenoloL  (TENORMIN ) 25 MG tablet, Take 25 mg by mouth once daily, Disp: , Rfl:  .  balsalazide (COLAZAL ) 750 mg capsule, Take 1 capsule (750 mg total) by mouth 3 (three) times daily, Disp: 270 capsule, Rfl: 3 .  biotin-silicon diox-L-cysteine 5,000 mcg-100 mg- 50 mg Tab, Take 1 tablet by mouth once daily., Disp: , Rfl:  .  cholecalciferol (VITAMIN D3) 1000 unit capsule, Take 1,000 Units by mouth once daily, Disp: , Rfl:  .  cyanocobalamin  (VITAMIN B12) 500 MCG tablet, Take 500 mcg by mouth once daily., Disp: , Rfl:  .  ferrous sulfate 325 (65 FE) MG EC tablet, Take 1 tablet (325 mg total) by mouth daily with breakfast, Disp: , Rfl:  .  finasteride  (PROSCAR ) 5 mg tablet, TAKE 1 TABLET EVERY DAY, Disp: 90 tablet, Rfl: 3 .  FOLIC ACID  ORAL, Take by mouth, Disp: , Rfl:  .  hydrALAZINE  (APRESOLINE ) 50 MG tablet, Take 1 tablet (50 mg total) by mouth 2 (two) times daily, Disp: 180 tablet, Rfl: 3 .  methotrexate  (RHEUMATREX) 2.5 MG tablet, Take 4 tablets (10 mg total) by mouth every 7 (seven) days for 90 days (Patient taking differently: Take 5 mg  by mouth every 7 (seven) days), Disp: 48 tablet, Rfl: 3 .  multivitamin tablet, Take 1 tablet by mouth once daily, Disp: , Rfl:  .  mv-min-FA-vit K-lutein-zeaxant (PRESERVISION AREDS 2 PLUS MV) 200 mcg-15 mcg- 5 mg-1 mg Cap, Take 2 capsules by mouth once daily, Disp: , Rfl:  .  omega 3-dha-epa-fish oil (FISH OIL) 1,000 (120-180) mg Cap, Take 1 capsule by mouth once daily, Disp: , Rfl:  .  omeprazole  (PRILOSEC) 40 MG DR capsule, Take 1 capsule (40 mg total) by mouth 2 (two) times daily before meals, Disp: 180 capsule, Rfl: 3 .  prednisoLONE  acetate (PRED FORTE ) 1 % ophthalmic suspension, Place 1 drop into  the left eye 2 (two) times daily, Disp: , Rfl:  .  pyRIDostigmine  (MESTINON ) 60 mg tablet, Take 1 tablet (60 mg total) by mouth 3 (three) times daily (Patient taking differently: Take 60 mg by mouth 2 (two) times daily), Disp: 270 tablet, Rfl: 3 .  tamsulosin  (FLOMAX ) 0.4 mg capsule, TAKE 1 CAPSULE (0.4 MG TOTAL) BY MOUTH ONCE DAILY 30 MINUTES AFTER THE SAME MEAL EACH DAY, Disp: 90 capsule, Rfl: 3  No Known Allergies  Results for orders placed or performed in visit on 10/17/23  Urinalysis w/Microscopic  Result Value Ref Range   Color Yellow Colorless, Straw, Light Yellow, Yellow, Dark Yellow   Clarity Clear Clear   Specific Gravity 1.020 1.000 - 1.030   pH, Urine 5.5 5.0 - 8.0   Protein, Urinalysis 30 (!) Negative, Trace mg/dL   Glucose, Urinalysis Negative Negative mg/dL   Ketones, Urinalysis Trace (!) Negative mg/dL   Blood, Urinalysis Negative Negative   Nitrite, Urinalysis Negative Negative   Leukocyte Esterase, Urinalysis Negative Negative   White Blood Cells, Urinalysis None Seen None Seen, 0-3 /hpf   Red Blood Cells, Urinalysis None Seen None Seen, 0-3 /hpf   Bacteria, Urinalysis None Seen None Seen /hpf   Squamous Epithelial Cells, Urinalysis Rare Rare, Few, None Seen /hpf   Narrative   Small mucous     Review of Systems  No fever, chills, nausea, or vomiting  Exam  Vitals:   10/17/23 1335  BP: 120/78  Pulse: 70  SpO2: 96%  Weight: 88.5 kg (195 lb)  Height: 170.2 cm (5' 7)    Body mass index is 30.54 kg/m.  General: Patient is well-groomed, well-nourished, appears stated age in no acute distress  HEENT: head is atraumatic and normocephalic, neck is supple with no palpable nodules  CV: Regular rhythm and normal heart rate, no murmur, no JVD  Respiratory: Clear to auscultation throughout lung fields with no wheezing, crackles, or rhonchi. No increased work of breathing  Impression/Plan  Gross hematuria: I don't think further workup is needed for this  based on his UA today in the office.  HTN: His blood pressure is at goal. I discussed that we could do a trial of increasing his atenolol .  DOE: He denies any swelling in his legs. His lungs are clear on exam. He has noticed some phlegm. I recommended that he start nasacort to see if this helps his congestion and helps his feeling short of breath.   This visit was part of longitudinal care for this patient involving long-term management of chronic medical conditions.

## 2023-10-30 ENCOUNTER — Encounter: Payer: Self-pay | Admitting: Emergency Medicine

## 2023-10-30 ENCOUNTER — Emergency Department

## 2023-10-30 ENCOUNTER — Other Ambulatory Visit: Payer: Self-pay

## 2023-10-30 ENCOUNTER — Observation Stay

## 2023-10-30 ENCOUNTER — Inpatient Hospital Stay
Admission: EM | Admit: 2023-10-30 | Discharge: 2023-11-02 | DRG: 919 | Disposition: A | Discharge status: Home / Self Care | Attending: Internal Medicine | Admitting: Internal Medicine

## 2023-10-30 DIAGNOSIS — K750 Abscess of liver: Secondary | ICD-10-CM | POA: Diagnosis present

## 2023-10-30 DIAGNOSIS — Z79631 Long term (current) use of antimetabolite agent: Secondary | ICD-10-CM

## 2023-10-30 DIAGNOSIS — K222 Esophageal obstruction: Secondary | ICD-10-CM | POA: Diagnosis present

## 2023-10-30 DIAGNOSIS — G7000 Myasthenia gravis without (acute) exacerbation: Secondary | ICD-10-CM | POA: Diagnosis present

## 2023-10-30 DIAGNOSIS — E8721 Acute metabolic acidosis: Secondary | ICD-10-CM | POA: Diagnosis present

## 2023-10-30 DIAGNOSIS — K219 Gastro-esophageal reflux disease without esophagitis: Secondary | ICD-10-CM | POA: Diagnosis present

## 2023-10-30 DIAGNOSIS — T8579XA Infection and inflammatory reaction due to other internal prosthetic devices, implants and grafts, initial encounter: Principal | ICD-10-CM | POA: Diagnosis present

## 2023-10-30 DIAGNOSIS — A419 Sepsis, unspecified organism: Principal | ICD-10-CM | POA: Diagnosis present

## 2023-10-30 DIAGNOSIS — K8309 Other cholangitis: Secondary | ICD-10-CM | POA: Diagnosis present

## 2023-10-30 DIAGNOSIS — R531 Weakness: Secondary | ICD-10-CM | POA: Diagnosis not present

## 2023-10-30 DIAGNOSIS — R748 Abnormal levels of other serum enzymes: Secondary | ICD-10-CM | POA: Diagnosis present

## 2023-10-30 DIAGNOSIS — J439 Emphysema, unspecified: Secondary | ICD-10-CM | POA: Diagnosis present

## 2023-10-30 DIAGNOSIS — Z87891 Personal history of nicotine dependence: Secondary | ICD-10-CM

## 2023-10-30 DIAGNOSIS — I4510 Unspecified right bundle-branch block: Secondary | ICD-10-CM | POA: Diagnosis present

## 2023-10-30 DIAGNOSIS — Z7982 Long term (current) use of aspirin: Secondary | ICD-10-CM

## 2023-10-30 DIAGNOSIS — J449 Chronic obstructive pulmonary disease, unspecified: Secondary | ICD-10-CM | POA: Diagnosis present

## 2023-10-30 DIAGNOSIS — Z79899 Other long term (current) drug therapy: Secondary | ICD-10-CM

## 2023-10-30 DIAGNOSIS — N400 Enlarged prostate without lower urinary tract symptoms: Secondary | ICD-10-CM | POA: Diagnosis present

## 2023-10-30 DIAGNOSIS — Z9049 Acquired absence of other specified parts of digestive tract: Secondary | ICD-10-CM

## 2023-10-30 DIAGNOSIS — I10 Essential (primary) hypertension: Secondary | ICD-10-CM | POA: Diagnosis present

## 2023-10-30 DIAGNOSIS — K5090 Crohn's disease, unspecified, without complications: Secondary | ICD-10-CM | POA: Diagnosis present

## 2023-10-30 DIAGNOSIS — Z96653 Presence of artificial knee joint, bilateral: Secondary | ICD-10-CM | POA: Diagnosis present

## 2023-10-30 DIAGNOSIS — Z1152 Encounter for screening for COVID-19: Secondary | ICD-10-CM

## 2023-10-30 DIAGNOSIS — H9190 Unspecified hearing loss, unspecified ear: Secondary | ICD-10-CM | POA: Diagnosis present

## 2023-10-30 DIAGNOSIS — M1990 Unspecified osteoarthritis, unspecified site: Secondary | ICD-10-CM | POA: Diagnosis present

## 2023-10-30 DIAGNOSIS — D649 Anemia, unspecified: Secondary | ICD-10-CM | POA: Diagnosis present

## 2023-10-30 DIAGNOSIS — E871 Hypo-osmolality and hyponatremia: Secondary | ICD-10-CM | POA: Diagnosis present

## 2023-10-30 DIAGNOSIS — Y838 Other surgical procedures as the cause of abnormal reaction of the patient, or of later complication, without mention of misadventure at the time of the procedure: Secondary | ICD-10-CM | POA: Diagnosis present

## 2023-10-30 LAB — COMPREHENSIVE METABOLIC PANEL WITH GFR
ALT: 55 U/L — ABNORMAL HIGH (ref 0–44)
AST: 59 U/L — ABNORMAL HIGH (ref 15–41)
Albumin: 2.9 g/dL — ABNORMAL LOW (ref 3.5–5.0)
Alkaline Phosphatase: 340 U/L — ABNORMAL HIGH (ref 38–126)
Anion gap: 12 (ref 5–15)
BUN: 15 mg/dL (ref 8–23)
CO2: 18 mmol/L — ABNORMAL LOW (ref 22–32)
Calcium: 8.2 mg/dL — ABNORMAL LOW (ref 8.9–10.3)
Chloride: 95 mmol/L — ABNORMAL LOW (ref 98–111)
Creatinine, Ser: 0.77 mg/dL (ref 0.61–1.24)
GFR, Estimated: >60 mL/min (ref >60)
Glucose, Bld: 115 mg/dL — ABNORMAL HIGH (ref 70–99)
Potassium: 4 mmol/L (ref 3.5–5.1)
Sodium: 125 mmol/L — ABNORMAL LOW (ref 135–145)
Total Bilirubin: 1.7 mg/dL — ABNORMAL HIGH (ref 0.0–1.2)
Total Protein: 7.3 g/dL (ref 6.5–8.1)

## 2023-10-30 LAB — URINALYSIS, ROUTINE W REFLEX MICROSCOPIC
Bilirubin Urine: NEGATIVE
Glucose, UA: NEGATIVE mg/dL
Hgb urine dipstick: NEGATIVE
Ketones, ur: NEGATIVE mg/dL
Leukocytes,Ua: NEGATIVE
Nitrite: NEGATIVE
Protein, ur: NEGATIVE mg/dL
Specific Gravity, Urine: 1.009 (ref 1.005–1.030)
pH: 5 (ref 5.0–8.0)

## 2023-10-30 LAB — LACTIC ACID, PLASMA: Lactic Acid, Venous: 1 mmol/L (ref 0.5–1.9)

## 2023-10-30 LAB — RESP PANEL BY RT-PCR (RSV, FLU A&B, COVID)  RVPGX2
Influenza A by PCR: NEGATIVE
Influenza B by PCR: NEGATIVE
Resp Syncytial Virus by PCR: NEGATIVE
SARS Coronavirus 2 by RT PCR: NEGATIVE

## 2023-10-30 LAB — CBC
HCT: 34.2 % — ABNORMAL LOW (ref 39.0–52.0)
Hemoglobin: 11.5 g/dL — ABNORMAL LOW (ref 13.0–17.0)
MCH: 31.5 pg (ref 26.0–34.0)
MCHC: 33.6 g/dL (ref 30.0–36.0)
MCV: 93.7 fL (ref 80.0–100.0)
Platelets: 381 K/uL (ref 150–400)
RBC: 3.65 MIL/uL — ABNORMAL LOW (ref 4.22–5.81)
RDW: 15.9 % — ABNORMAL HIGH (ref 11.5–15.5)
WBC: 12.9 K/uL — ABNORMAL HIGH (ref 4.0–10.5)
nRBC: 0 % (ref 0.0–0.2)

## 2023-10-30 MED ORDER — SODIUM CHLORIDE 0.9 % IV BOLUS
1000.0000 mL | Freq: Once | INTRAVENOUS | Status: AC
  Administered 2023-10-30: 1000 mL via INTRAVENOUS

## 2023-10-30 MED ORDER — ONDANSETRON HCL 4 MG/2ML IJ SOLN: 4.0000 mg | Freq: Four times a day (QID) | INTRAMUSCULAR | Status: DC | PRN

## 2023-10-30 MED ORDER — TAMSULOSIN HCL 0.4 MG PO CAPS
0.4000 mg | ORAL_CAPSULE | Freq: Every day | ORAL | Status: DC
  Administered 2023-10-31 – 2023-11-02 (×2): 0.4 mg via ORAL
  Filled 2023-10-30 (×3): qty 1

## 2023-10-30 MED ORDER — METRONIDAZOLE 500 MG/100ML IV SOLN
500.0000 mg | Freq: Two times a day (BID) | INTRAVENOUS | Status: DC
  Administered 2023-10-30 – 2023-11-02 (×6): 500 mg via INTRAVENOUS
  Filled 2023-10-30 (×7): qty 100

## 2023-10-30 MED ORDER — SODIUM CHLORIDE 0.9 % IV SOLN
2.0000 g | Freq: Three times a day (TID) | INTRAVENOUS | Status: DC
  Administered 2023-10-31 – 2023-11-02 (×7): 2 g via INTRAVENOUS
  Filled 2023-10-30 (×9): qty 12.5

## 2023-10-30 MED ORDER — PYRIDOSTIGMINE BROMIDE 60 MG PO TABS
60.0000 mg | ORAL_TABLET | Freq: Two times a day (BID) | ORAL | Status: DC
  Administered 2023-10-31 – 2023-11-02 (×4): 60 mg via ORAL
  Filled 2023-10-30 (×6): qty 1

## 2023-10-30 MED ORDER — ALBUTEROL SULFATE (2.5 MG/3ML) 0.083% IN NEBU: 2.5000 mg | INHALATION_SOLUTION | Freq: Four times a day (QID) | RESPIRATORY_TRACT | Status: DC | PRN

## 2023-10-30 MED ORDER — LACTATED RINGERS IV SOLN: INTRAVENOUS | Status: AC

## 2023-10-30 MED ORDER — ACETAMINOPHEN 650 MG RE SUPP: 650.0000 mg | Freq: Four times a day (QID) | RECTAL | Status: DC | PRN

## 2023-10-30 MED ORDER — ONDANSETRON HCL 4 MG PO TABS: 4.0000 mg | ORAL_TABLET | Freq: Four times a day (QID) | ORAL | Status: DC | PRN

## 2023-10-30 MED ORDER — LACTATED RINGERS IV SOLN: 150.0000 mL/h | INTRAVENOUS | Status: AC

## 2023-10-30 MED ORDER — HYDROCODONE-ACETAMINOPHEN 5-325 MG PO TABS: 1.0000 | ORAL_TABLET | ORAL | Status: DC | PRN

## 2023-10-30 MED ORDER — MORPHINE SULFATE (PF) 2 MG/ML IV SOLN: 2.0000 mg | INTRAVENOUS | Status: DC | PRN

## 2023-10-30 MED ORDER — METRONIDAZOLE 500 MG/100ML IV SOLN
500.0000 mg | Freq: Once | INTRAVENOUS | Status: DC
Start: 2023-10-30 — End: 2023-10-30
  Filled 2023-10-30: qty 100

## 2023-10-30 MED ORDER — GADOBUTROL 1 MMOL/ML IV SOLN
9.0000 mL | Freq: Once | INTRAVENOUS | Status: AC | PRN
  Administered 2023-10-30: 9 mL via INTRAVENOUS

## 2023-10-30 MED ORDER — ASPIRIN 81 MG PO TBEC
81.0000 mg | DELAYED_RELEASE_TABLET | Freq: Every day | ORAL | Status: DC
  Administered 2023-10-31 – 2023-11-02 (×2): 81 mg via ORAL
  Filled 2023-10-30 (×3): qty 1

## 2023-10-30 MED ORDER — SODIUM CHLORIDE 0.9 % IV SOLN
2.0000 g | Freq: Once | INTRAVENOUS | Status: AC
  Administered 2023-10-30: 2 g via INTRAVENOUS
  Filled 2023-10-30: qty 12.5

## 2023-10-30 MED ORDER — PANTOPRAZOLE SODIUM 40 MG PO TBEC
40.0000 mg | DELAYED_RELEASE_TABLET | Freq: Every day | ORAL | Status: DC
  Administered 2023-10-31 – 2023-11-02 (×2): 40 mg via ORAL
  Filled 2023-10-30 (×3): qty 1

## 2023-10-30 MED ORDER — ACETAMINOPHEN 325 MG PO TABS: 650.0000 mg | ORAL_TABLET | Freq: Four times a day (QID) | ORAL | Status: DC | PRN

## 2023-10-30 MED ORDER — KETOROLAC TROMETHAMINE 15 MG/ML IJ SOLN: 15.0000 mg | Freq: Four times a day (QID) | INTRAMUSCULAR | Status: DC | PRN

## 2023-10-30 MED ORDER — FINASTERIDE 5 MG PO TABS
5.0000 mg | ORAL_TABLET | Freq: Every day | ORAL | Status: DC
  Administered 2023-10-31 – 2023-11-02 (×2): 5 mg via ORAL
  Filled 2023-10-30 (×3): qty 1

## 2023-10-30 NOTE — Assessment & Plan Note (Addendum)
 Continue pyridostigmine  Avoid meds likely to worsen symptoms

## 2023-10-30 NOTE — Assessment & Plan Note (Signed)
 Continue finasteride and tamsulosin

## 2023-10-30 NOTE — Assessment & Plan Note (Signed)
 No acute issues, no nausea or vomiting

## 2023-10-30 NOTE — Assessment & Plan Note (Signed)
 Increased communication needs

## 2023-10-30 NOTE — Assessment & Plan Note (Signed)
Not acutely flared 

## 2023-10-30 NOTE — Assessment & Plan Note (Signed)
 Bicarb of 18 likely secondary to acute infection/sepsis Expecting improvement with hydration Continue to monitor.

## 2023-10-30 NOTE — Assessment & Plan Note (Signed)
 Blood pressure somewhat soft at 109/74 in the setting of sepsis so we will hold home atenolol  and hydralazine  for now

## 2023-10-30 NOTE — ED Triage Notes (Signed)
 Patient to ED via POV for generalized weakness and chills. PT states symptoms started this AM. Denies pain. Pt reports having his gallbladder removed approx 1 month ago and needs stents placement.

## 2023-10-30 NOTE — Assessment & Plan Note (Addendum)
 Possible sepsis S/p cholecystectomy, prior ERCP with biliary stent for acalculous cholecystitis 07/2023 Sepsis criteria include low-grade temp, tachycardia and leukocytosis, soft blood pressure Elevated LFTs in the setting of history of biliary stent June s/p cholecystectomy Sepsis fluids Empiric antibiotics with Zosyn  and Flagyl  for sepsis of intra-abdominal source Follow-up MRCP Follow cultures Will get GI consult in view of elevated LFTs  Will keep n.p.o. from midnight in case of need for procedure

## 2023-10-30 NOTE — Sepsis Progress Note (Signed)
 Following for sepsis monitoring ?

## 2023-10-30 NOTE — ED Provider Notes (Incomplete)
 [facility] Provider Note    Event Date/Time   First MD Initiated Contact with Patient 10/30/23 1733     (approximate)   History   Weakness   HPI  Kyle MINNER Sr. is a 83 y.o. male past medical history significant for Crohn's disease, myasthenia gravis, esophageal stricture, GERD, COPD, recent cholecystectomy, presents to the emergency department with weakness.  States that he started having subjective fever at home with chills and was not feeling well.  Complaining of generalized weakness and fatigue.  Decreased urine output.  Denies any dysuria, urinary urgency or frequency.  Denies any abdominal pain nausea or vomiting.  States that he had a recent cholecystectomy approximately 6 weeks ago.  Patient also had a recent ERCP and had a stent placement at that time.  On chart review patient was discharged on 07/26/2023 following ERCP and cholecystectomy     Physical Exam   Triage Vital Signs: ED Triage Vitals [10/30/23 1525]  Encounter Vitals Group     BP 121/73     Girls Systolic BP Percentile      Girls Diastolic BP Percentile      Boys Systolic BP Percentile      Boys Diastolic BP Percentile      Pulse Rate (!) 102     Resp 17     Temp 99.5 F (37.5 C)     Temp Source Oral     SpO2 92 %     Weight 195 lb (88.5 kg)     Height 5' 10 (1.778 m)     Head Circumference      Peak Flow      Pain Score 0     Pain Loc      Pain Education      Exclude from Growth Chart     Most recent vital signs: Vitals:   10/30/23 2141 10/31/23 0039  BP: 112/79 139/75  Pulse: 76 70  Resp:    Temp: 97.6 F (36.4 C) 97.7 F (36.5 C)  SpO2: 96% 100%    Physical Exam Constitutional:      Appearance: He is well-developed.  HENT:     Head: Atraumatic.  Eyes:     Extraocular Movements: Extraocular movements intact.     Conjunctiva/sclera: Conjunctivae normal.     Pupils: Pupils are equal, round, and reactive to light.  Cardiovascular:     Rate and  Rhythm: Regular rhythm. Tachycardia present.  Pulmonary:     Effort: No respiratory distress.     Breath sounds: No wheezing.  Abdominal:     Tenderness: There is no abdominal tenderness.  Musculoskeletal:     Cervical back: Normal range of motion.     Right lower leg: No edema.     Left lower leg: No edema.  Skin:    General: Skin is warm.     Capillary Refill: Capillary refill takes less than 2 seconds.  Neurological:     Mental Status: He is alert. Mental status is at baseline.  Psychiatric:        Mood and Affect: Mood normal.     IMPRESSION / MDM / ASSESSMENT AND PLAN / ED COURSE  I reviewed the triage vital signs and the nursing notes.  Differential diagnosis including dehydration, infectious process including intra-abdominal infection, pneumonia, viral illness including COVID/influenza, urinary tract infection, electrolyte abnormality, dehydration, retained stone in the bile duct  EKG  I, Clotilda Punter, the attending physician, personally viewed and interpreted this ECG.  EKG showed normal sinus rhythm with frequent PVCs.  No significant ST elevation or depression.  More frequent PVCs when compared to prior.  Decreased rate when compared to prior.  Underlying right bundle branch block  No tachycardic or bradycardic dysrhythmias while on cardiac telemetry.  RADIOLOGY I independently reviewed imaging, my interpretation of imaging: Chest x-ray with no signs of pneumonia  MRCP ordered but not obtained at time of admission  LABS (all labs ordered are listed, but only abnormal results are displayed) Labs interpreted as -    Labs Reviewed  COMPREHENSIVE METABOLIC PANEL WITH GFR - Abnormal; Notable for the following components:      Result Value   Sodium 125 (*)    Chloride 95 (*)    CO2 18 (*)    Glucose, Bld 115 (*)    Calcium 8.2 (*)    Albumin 2.9 (*)    AST 59 (*)    ALT 55 (*)    Alkaline Phosphatase 340 (*)    Total Bilirubin 1.7 (*)    All other  components within normal limits  CBC - Abnormal; Notable for the following components:   WBC 12.9 (*)    RBC 3.65 (*)    Hemoglobin 11.5 (*)    HCT 34.2 (*)    RDW 15.9 (*)    All other components within normal limits  URINALYSIS, ROUTINE W REFLEX MICROSCOPIC - Abnormal; Notable for the following components:   Color, Urine AMBER (*)    APPearance CLEAR (*)    All other components within normal limits  RESP PANEL BY RT-PCR (RSV, FLU A&B, COVID)  RVPGX2  CULTURE, BLOOD (ROUTINE X 2)  CULTURE, BLOOD (ROUTINE X 2)  LACTIC ACID, PLASMA  COMPREHENSIVE METABOLIC PANEL WITH GFR  CBC  CBG MONITORING, ED     MDM  On arrival afebrile but tachycardic.  Normotensive  Lab work with significant hyponatremia with a sodium of 125.  Creatinine appears to be at his baseline.  Normal BUN.  CO2 is low at 18.  Does have elevation of T. bili and alk phos with AST and ALT elevation.  COVID and influenza testing negative.  Leukocytosis of 12.9.  Anemia but hemoglobin appears stable at 11.5 normal lactic acid at 1.0.  Urine with no signs of urinary tract infection.  Patient given 1 L of normal saline.  After discussion with the hospitalist given his subjective fevers at home with leukocytosis and questionable underlying intra-abdominal process we will go ahead and get blood cultures and add on a lactic acid.  Started on intra-abdominal antibiotics.  MRCP ordered to further evaluate for retained stone.   Patient admitted for hyponatremia     PROCEDURES:  Critical Care performed: yes  .Critical Care  Performed by: Suzanne Kirsch, MD Authorized by: Suzanne Kirsch, MD   Critical care provider statement:    Critical care time (minutes):  40   Critical care time was exclusive of:  Separately billable procedures and treating other patients   Critical care was necessary to treat or prevent imminent or life-threatening deterioration of the following conditions:  Metabolic crisis   Critical care was time  spent personally by me on the following activities:  Development of treatment plan with patient or surrogate, discussions with consultants, evaluation of patient's response to treatment, examination of patient, ordering and review of laboratory studies, ordering and review of radiographic studies, ordering and performing treatments and interventions, pulse oximetry, re-evaluation of patient's condition and review of old charts   Care  discussed with: admitting provider     Patient's presentation is most consistent with acute presentation with potential threat to life or bodily function.   MEDICATIONS ORDERED IN ED: Medications  lactated ringers  infusion ( Intravenous New Bag/Given 10/30/23 2203)  aspirin  EC tablet 81 mg (has no administration in time range)  pantoprazole  (PROTONIX ) EC tablet 40 mg (has no administration in time range)  finasteride  (PROSCAR ) tablet 5 mg (has no administration in time range)  tamsulosin  (FLOMAX ) capsule 0.4 mg (has no administration in time range)  pyridostigmine  (MESTINON ) tablet 60 mg (has no administration in time range)  albuterol  (PROVENTIL ) (2.5 MG/3ML) 0.083% nebulizer solution 2.5 mg (has no administration in time range)  lactated ringers  infusion (150 mL/hr Intravenous Not Given 10/30/23 2203)  metroNIDAZOLE  (FLAGYL ) IVPB 500 mg (500 mg Intravenous New Bag/Given 10/30/23 2319)  acetaminophen  (TYLENOL ) tablet 650 mg (has no administration in time range)    Or  acetaminophen  (TYLENOL ) suppository 650 mg (has no administration in time range)  HYDROcodone -acetaminophen  (NORCO/VICODIN) 5-325 MG per tablet 1-2 tablet (has no administration in time range)  ondansetron  (ZOFRAN ) tablet 4 mg (has no administration in time range)    Or  ondansetron  (ZOFRAN ) injection 4 mg (has no administration in time range)  ketorolac  (TORADOL ) 15 MG/ML injection 15 mg (has no administration in time range)  morphine  (PF) 2 MG/ML injection 2 mg (has no administration in time  range)  ceFEPIme  (MAXIPIME ) 2 g in sodium chloride  0.9 % 100 mL IVPB (has no administration in time range)  sodium chloride  0.9 % bolus 1,000 mL (0 mLs Intravenous Stopped 10/30/23 2001)  sodium chloride  0.9 % bolus 1,000 mL (1,000 mLs Intravenous Bolus from Bag 10/30/23 1955)  ceFEPIme  (MAXIPIME ) 2 g in sodium chloride  0.9 % 100 mL IVPB (0 g Intravenous Stopped 10/30/23 2204)  gadobutrol  (GADAVIST ) 1 MMOL/ML injection 9 mL (9 mLs Intravenous Contrast Given 10/30/23 2119)    FINAL CLINICAL IMPRESSION(S) / ED DIAGNOSES   Final diagnoses:  Weakness  Hyponatremia     Rx / DC Orders   ED Discharge Orders     None        Note:  This document was prepared using Dragon voice recognition software and may include unintentional dictation errors.   Suzanne Kirsch, MD 10/31/23 9951    Suzanne Kirsch, MD 10/31/23 [phone]

## 2023-10-30 NOTE — ED Notes (Signed)
 ED TO INPATIENT HANDOFF REPORT  ED Nurse Name and Phone #: [phone]  S Name/Age/Gender Kyle ONEIDA Dec Sr. 83 y.o. male Room/Bed: ED07A/ED07A  Code Status   Code Status: Full Code  Home/SNF/Other Home Patient oriented to: self, place, time, and situation Is this baseline? Yes   Triage Complete: Triage complete  Chief Complaint Sepsis, unspecified organism [facility]) [A41.9]  Triage Note Patient to ED via POV for generalized weakness and chills. PT states symptoms started this AM. Denies pain. Pt reports having his gallbladder removed approx 1 month ago and needs stents placement.    Allergies No Known Allergies  Level of Care/Admitting Diagnosis ED Disposition     ED Disposition  Admit   Condition  --   Comment  Hospital Area: [facility] [[id number]]  Level of Care: Telemetry Medical [104]  Covid Evaluation: Asymptomatic - no recent exposure (last 10 days) testing not required  Diagnosis: Sepsis, unspecified organism [facility]) [[id number]]  Admitting Physician: CLEATUS DELAYNE GAILS [[id number]]  Attending Physician: CLEATUS DELAYNE GAILS [[id number]]  For patients discharging to extended facilities (i.e. SNF, AL, group homes or LTAC) initiate:: Discharge to SNF/Facility Placement COVID-19 Lab Testing Protocol          B Medical/Surgery History Past Medical History:  Diagnosis Date   Arthritis    COPD (chronic obstructive pulmonary disease) (HCC)    Crohn's disease (HCC)    Emphysema, unspecified (HCC)    Hypertension    Myasthenia gravis (HCC)    Past Surgical History:  Procedure Laterality Date   BILIARY BRUSHING  07/21/2023   Procedure: BRUSH BIOPSY, BILE DUCT;  Surgeon: Rollin Dover, MD;  Location: [facility] ENDOSCOPY;  Service: Gastroenterology;;   BILIARY BRUSHING  10/04/2023   Procedure: BRUSH BIOPSY, BILE DUCT;  Surgeon: Rollin Dover, MD;  Location: [facility] ENDOSCOPY;  Service: Gastroenterology;;   BILIARY STENT PLACEMENT  07/21/2023   Procedure: INSERTION, STENT, BILE  DUCT;  Surgeon: Rollin Dover, MD;  Location: [facility] ENDOSCOPY;  Service: Gastroenterology;;   BILIARY STENT PLACEMENT N/A 10/04/2023   Procedure: INSERTION, STENT, BILE DUCT;  Surgeon: Rollin Dover, MD;  Location: [facility] ENDOSCOPY;  Service: Gastroenterology;  Laterality: N/A;   CHOLECYSTECTOMY N/A 07/25/2023   Procedure: LAPAROSCOPIC CHOLECYSTECTOMY;  Surgeon: Ebbie Cough, MD;  Location: [facility] OR;  Service: General;  Laterality: N/A;  WITH ICG DYE   COLONOSCOPY WITH PROPOFOL  N/A 03/20/2023   Procedure: COLONOSCOPY WITH PROPOFOL ;  Surgeon: Jinny Carmine, MD;  Location: [facility] ENDOSCOPY;  Service: Endoscopy;  Laterality: N/A;   EMBOLIZATION (CATH LAB) N/A 03/21/2023   Procedure: EMBOLIZATION;  Surgeon: Jama Cordella MATSU, MD;  Location: [facility] INVASIVE CV LAB;  Service: Cardiovascular;  Laterality: N/A;   ERCP N/A 07/21/2023   Procedure: ERCP, WITH INTERVENTION IF INDICATED;  Surgeon: Rollin Dover, MD;  Location: [facility] ENDOSCOPY;  Service: Gastroenterology;  Laterality: N/A;   ERCP N/A 10/04/2023   Procedure: ERCP, WITH INTERVENTION IF INDICATED;  Surgeon: Rollin Dover, MD;  Location: [facility] ENDOSCOPY;  Service: Gastroenterology;  Laterality: N/A;   ESOPHAGOGASTRODUODENOSCOPY N/A 03/19/2023   Procedure: ESOPHAGOGASTRODUODENOSCOPY (EGD);  Surgeon: Jinny Carmine, MD;  Location: [facility] ENDOSCOPY;  Service: Endoscopy;  Laterality: N/A;   ESOPHAGOGASTRODUODENOSCOPY N/A 10/04/2023   Procedure: EGD (ESOPHAGOGASTRODUODENOSCOPY);  Surgeon: Rollin Dover, MD;  Location: [facility] ENDOSCOPY;  Service: Gastroenterology;  Laterality: N/A;   EUS N/A 10/04/2023   Procedure: ULTRASOUND, UPPER GI TRACT, ENDOSCOPIC;  Surgeon: Rollin Dover, MD;  Location: [facility] ENDOSCOPY;  Service: Gastroenterology;  Laterality: N/A;   JOINT REPLACEMENT Left    total knee replacement  Dr. Mardee   SPHINCTEROTOMY  07/21/2023   Procedure: ANNETT, GI;  Surgeon: Rollin Dover, MD;  Location: [facility] ENDOSCOPY;  Service: Gastroenterology;;   CLEDA REMOVAL   10/04/2023   Procedure: STENT REMOVAL;  Surgeon: Rollin Dover, MD;  Location: [facility] ENDOSCOPY;  Service: Gastroenterology;;   TONSILLECTOMY     TOTAL KNEE ARTHROPLASTY Right 11/20/2021   Procedure: RIGHT TOTAL KNEE ARTHROPLASTY;  Surgeon: Liam Lerner, MD;  Location: [facility] ORS;  Service: Orthopedics;  Laterality: Right;   WISDOM TOOTH EXTRACTION       A IV Location/Drains/Wounds Patient Lines/Drains/Airways Status     Active Line/Drains/Airways     Name Placement date Placement time Site Days   Peripheral IV 10/30/23 20 G Anterior;Left;Proximal Forearm 10/30/23  1824  Forearm  less than 1   GI Stent 10/04/23  1023  --  26   GI Stent 10/04/23  1023  --  26   Wound 07/25/23 1303 Surgical Laparoscopic Abdomen 1: Umbilicus 2: Mid;Upper 3: Right;Upper 4: Right;Mid 07/25/23  1303  Abdomen  97            Intake/Output Last 24 hours No intake or output data in the 24 hours ending 10/30/23 2000  Labs/Imaging Results for orders placed or performed during the hospital encounter of 10/30/23 (from the past 48 hours)  Comprehensive metabolic panel     Status: Abnormal   Collection Time: 10/30/23  3:28 PM  Result Value Ref Range   Sodium 125 (L) 135 - 145 mmol/L   Potassium 4.0 3.5 - 5.1 mmol/L   Chloride 95 (L) 98 - 111 mmol/L   CO2 18 (L) 22 - 32 mmol/L   Glucose, Bld 115 (H) 70 - 99 mg/dL    Comment: Glucose reference range applies only to samples taken after fasting for at least 8 hours.   BUN 15 8 - 23 mg/dL   Creatinine, Ser 9.22 0.61 - 1.24 mg/dL   Calcium 8.2 (L) 8.9 - 10.3 mg/dL   Total Protein 7.3 6.5 - 8.1 g/dL   Albumin 2.9 (L) 3.5 - 5.0 g/dL   AST 59 (H) 15 - 41 U/L   ALT 55 (H) 0 - 44 U/L   Alkaline Phosphatase 340 (H) 38 - 126 U/L   Total Bilirubin 1.7 (H) 0.0 - 1.2 mg/dL   GFR, Estimated >39 >39 mL/min    Comment: (NOTE) Calculated using the CKD-EPI Creatinine Equation (2021)    Anion gap 12 5 - 15    Comment: Performed at [facility], [address]  [place], [place] [address]  CBC     Status: Abnormal   Collection Time: 10/30/23  3:28 PM  Result Value Ref Range   WBC 12.9 (H) 4.0 - 10.5 K/uL   RBC 3.65 (L) 4.22 - 5.81 MIL/uL   Hemoglobin 11.5 (L) 13.0 - 17.0 g/dL   HCT 65.7 (L) 60.9 - 47.9 %   MCV 93.7 80.0 - 100.0 fL   MCH 31.5 26.0 - 34.0 pg   MCHC 33.6 30.0 - 36.0 g/dL   RDW 84.0 (H) 88.4 - 84.4 %   Platelets 381 150 - 400 K/uL   nRBC 0.0 0.0 - 0.2 %    Comment: Performed at [facility], [address] [place], [place] [address]  Resp panel by RT-PCR (RSV, Flu A&B, Covid) Anterior Nasal Swab     Status: None   Collection Time: 10/30/23  3:29 PM   Specimen: Anterior Nasal Swab  Result Value Ref Range  SARS Coronavirus 2 by RT PCR NEGATIVE NEGATIVE    Comment: (NOTE) SARS-CoV-2 target nucleic acids are NOT DETECTED.  The SARS-CoV-2 RNA is generally detectable in upper respiratory specimens during the acute phase of infection. The lowest concentration of SARS-CoV-2 viral copies this assay can detect is 138 copies/mL. A negative result does not preclude SARS-Cov-2 infection and should not be used as the sole basis for treatment or other patient management decisions. A negative result may occur with  improper specimen collection/handling, submission of specimen other than nasopharyngeal swab, presence of viral mutation(s) within the areas targeted by this assay, and inadequate number of viral copies(<138 copies/mL). A negative result must be combined with clinical observations, patient history, and epidemiological information. The expected result is Negative.  Fact Sheet for Patients:  BloggerCourse.com  Fact Sheet for Healthcare Providers:  SeriousBroker.it  This test is no t yet approved or cleared by the [place]  FDA and  has been authorized for detection and/or diagnosis of SARS-CoV-2 by FDA under an Emergency Use Authorization (EUA). This EUA will remain   in effect (meaning this test can be used) for the duration of the COVID-19 declaration under Section 564(b)(1) of the Act, 21 U.S.C.section 360bbb-3(b)(1), unless the authorization is terminated  or revoked sooner.       Influenza A by PCR NEGATIVE NEGATIVE   Influenza B by PCR NEGATIVE NEGATIVE    Comment: (NOTE) The Xpert Xpress SARS-CoV-2/FLU/RSV plus assay is intended as an aid in the diagnosis of influenza from Nasopharyngeal swab specimens and should not be used as a sole basis for treatment. Nasal washings and aspirates are unacceptable for Xpert Xpress SARS-CoV-2/FLU/RSV testing.  Fact Sheet for Patients: BloggerCourse.com  Fact Sheet for Healthcare Providers: SeriousBroker.it  This test is not yet approved or cleared by the [place]  FDA and has been authorized for detection and/or diagnosis of SARS-CoV-2 by FDA under an Emergency Use Authorization (EUA). This EUA will remain in effect (meaning this test can be used) for the duration of the COVID-19 declaration under Section 564(b)(1) of the Act, 21 U.S.C. section 360bbb-3(b)(1), unless the authorization is terminated or revoked.     Resp Syncytial Virus by PCR NEGATIVE NEGATIVE    Comment: (NOTE) Fact Sheet for Patients: BloggerCourse.com  Fact Sheet for Healthcare Providers: SeriousBroker.it  This test is not yet approved or cleared by the [place]  FDA and has been authorized for detection and/or diagnosis of SARS-CoV-2 by FDA under an Emergency Use Authorization (EUA). This EUA will remain in effect (meaning this test can be used) for the duration of the COVID-19 declaration under Section 564(b)(1) of the Act, 21 U.S.C. section 360bbb-3(b)(1), unless the authorization is terminated or revoked.  Performed at [facility], [address] [place], [place] [address]   Lactic acid, plasma      Status: None   Collection Time: 10/30/23  6:23 PM  Result Value Ref Range   Lactic Acid, Venous 1.0 0.5 - 1.9 mmol/L    Comment: Performed at [facility], [address] [place], [place] [address]   DG Chest 2 View Result Date: 10/30/2023 CLINICAL DATA:  dyspnea EXAM: CHEST - 2 VIEW COMPARISON:  July 20, 2023 FINDINGS: No focal airspace consolidation, pleural effusion, or pneumothorax. No cardiomegaly. Hiatal hernia. Tortuous aorta with aortic atherosclerosis. No acute fracture or destructive lesions. Multilevel thoracic osteophytosis. IMPRESSION: No acute cardiopulmonary abnormality. Electronically Signed   By: Rogelia Myers M.D.   On: 10/30/2023 19:20    Pending Labs Unresulted  Labs (From admission, onward)     Start     Ordered   10/31/23 0500  Comprehensive metabolic panel  Tomorrow morning,   R        10/30/23 1955   10/31/23 0500  CBC  Tomorrow morning,   R        10/30/23 1955   10/30/23 1810  Blood culture (routine x 2)  BLOOD CULTURE X 2,   STAT      10/30/23 1809   10/30/23 1528  Urinalysis, Routine w reflex microscopic -Urine, Clean Catch  Once,   URGENT       Question:  Specimen Source  Answer:  Urine, Clean Catch   10/30/23 1528            Vitals/Pain Today's Vitals   10/30/23 1525 10/30/23 1800 10/30/23 1950  BP: 121/73 109/74   Pulse: (!) 102 83   Resp: 17 17   Temp: 99.5 F (37.5 C)  98.1 F (36.7 C)  TempSrc: Oral  Oral  SpO2: 92% 96%   Weight: 88.5 kg    Height: 5' 10 (1.778 m)    PainSc: 0-No pain      Isolation Precautions No active isolations  Medications Medications  lactated ringers  infusion (has no administration in time range)  ceFEPIme  (MAXIPIME ) 2 g in sodium chloride  0.9 % 100 mL IVPB (2 g Intravenous New Bag/Given 10/30/23 1956)  metroNIDAZOLE  (FLAGYL ) IVPB 500 mg (has no administration in time range)  aspirin  EC tablet 81 mg (has no administration in time range)  pantoprazole  (PROTONIX ) EC tablet 40 mg (has no  administration in time range)  finasteride  (PROSCAR ) tablet 5 mg (has no administration in time range)  tamsulosin  (FLOMAX ) capsule 0.4 mg (has no administration in time range)  pyridostigmine  (MESTINON ) tablet 60 mg (has no administration in time range)  albuterol  (VENTOLIN  HFA) 108 (90 Base) MCG/ACT inhaler 2 puff (has no administration in time range)  lactated ringers  infusion (has no administration in time range)  metroNIDAZOLE  (FLAGYL ) IVPB 500 mg (has no administration in time range)  acetaminophen  (TYLENOL ) tablet 650 mg (has no administration in time range)    Or  acetaminophen  (TYLENOL ) suppository 650 mg (has no administration in time range)  HYDROcodone -acetaminophen  (NORCO/VICODIN) 5-325 MG per tablet 1-2 tablet (has no administration in time range)  ondansetron  (ZOFRAN ) tablet 4 mg (has no administration in time range)    Or  ondansetron  (ZOFRAN ) injection 4 mg (has no administration in time range)  ketorolac  (TORADOL ) 15 MG/ML injection 15 mg (has no administration in time range)  morphine  (PF) 2 MG/ML injection 2 mg (has no administration in time range)  sodium chloride  0.9 % bolus 1,000 mL (1,000 mLs Intravenous New Bag/Given 10/30/23 1827)  sodium chloride  0.9 % bolus 1,000 mL (1,000 mLs Intravenous Bolus from Bag 10/30/23 1955)    Mobility walks     Focused Assessments     R Recommendations: See Admitting Provider Note  Report given to:   Additional Notes:

## 2023-10-30 NOTE — Consult Note (Signed)
 CODE SEPSIS - PHARMACY COMMUNICATION  **Broad Spectrum Antibiotics should be administered within 1 hour of Sepsis diagnosis**  Time Code Sepsis Called/Page Received: 1930  Antibiotics Ordered: cefepime  and metronidazole   Time of 1st antibiotic administration: cefepime  at 1956  Additional action taken by pharmacy: none  If necessary, Name of Provider/Nurse Contacted: n/a    Marjory Meints Rodriguez-Guzman PharmD, BCPS 10/30/2023 7:33 PM

## 2023-10-30 NOTE — Assessment & Plan Note (Signed)
 Not acutely exacerbated DuoNebs as needed

## 2023-10-30 NOTE — H&P (Signed)
 History and Physical    Patient: Kyle Holloway FMW:[id number] DOB: April 16, 1940 DOA: 10/30/2023 DOS: the patient was seen and examined on 10/30/2023 PCP: Diedra Lame, MD  Patient coming from: Home  Chief Complaint:  Chief Complaint  Patient presents with   Weakness    HPI: Amore Grater. is a 83 y.o. male with medical history significant for Crohn's disease, myasthenia gravis, esophageal stricture, COPD, BPH and hard of hearing, admitted in June 2025 with acute calculous cholecystitis s/p ERCP with stent, now s/p lap chole 07/25/2023, being admitted with suspected sepsis secondary to biliary source pending further evaluation with MRCP. He presented to the ED with generalized weakness and chills starting on the morning of presentation.  He denies abdominal pain, vomiting, change in bowel habits, chest pain, shortness of breath or cough and denies dysuria. In the ED tachycardic to 102 with a low-grade temp of 99.5 and otherwise normal vitals. Labs notable for WBC of 12.9 and lactic acid 1.0. LFTs elevated with AST/ALT 59/55, down from 83/111 about 3 months ago.  Alk phos more elevated at 340 compared to 210 and bilirubin at 1.7 which is about the same as 3 months ago.  Creatinine normal at 0.77 and bicarb 18 with normal anion gap.  Sodium 125.  Hemoglobin 11.5 down from 14.2 in June. Respiratory viral panel negative Urinalysis pending EKG, showing sinus at 96 with RBBB Chest x-ray nonacute MRCP ordered at the time of admission requested so not yet performed. Admission requested.  After discussing with ED provider, patient will be started on sepsis fluids and antibiotics to cover suspected intra-abdominal source while awaiting MRCP. Patient will be admitted to med/tele Will consult GI depending on results of MRCP.     Review of Systems: {ROS_Text:[id number]}  Past Medical History:  Diagnosis Date   Arthritis    COPD (chronic obstructive pulmonary disease) (HCC)    Crohn's  disease (HCC)    Emphysema, unspecified (HCC)    Hypertension    Myasthenia gravis (HCC)    Past Surgical History:  Procedure Laterality Date   BILIARY BRUSHING  07/21/2023   Procedure: BRUSH BIOPSY, BILE DUCT;  Surgeon: Rollin Dover, MD;  Location: [facility] ENDOSCOPY;  Service: Gastroenterology;;   BILIARY BRUSHING  10/04/2023   Procedure: BRUSH BIOPSY, BILE DUCT;  Surgeon: Rollin Dover, MD;  Location: [facility];  Service: Gastroenterology;;   BILIARY STENT PLACEMENT  07/21/2023   Procedure: INSERTION, STENT, BILE DUCT;  Surgeon: Rollin Dover, MD;  Location: [facility] ENDOSCOPY;  Service: Gastroenterology;;   BILIARY STENT PLACEMENT N/A 10/04/2023   Procedure: INSERTION, STENT, BILE DUCT;  Surgeon: Rollin Dover, MD;  Location: [facility] ENDOSCOPY;  Service: Gastroenterology;  Laterality: N/A;   CHOLECYSTECTOMY N/A 07/25/2023   Procedure: LAPAROSCOPIC CHOLECYSTECTOMY;  Surgeon: Ebbie Cough, MD;  Location: [facility] OR;  Service: General;  Laterality: N/A;  WITH ICG DYE   COLONOSCOPY WITH PROPOFOL  N/A 03/20/2023   Procedure: COLONOSCOPY WITH PROPOFOL ;  Surgeon: Jinny Carmine, MD;  Location: [facility] ENDOSCOPY;  Service: Endoscopy;  Laterality: N/A;   EMBOLIZATION (CATH LAB) N/A 03/21/2023   Procedure: EMBOLIZATION;  Surgeon: Jama Cordella MATSU, MD;  Location: [facility] INVASIVE CV LAB;  Service: Cardiovascular;  Laterality: N/A;   ERCP N/A 07/21/2023   Procedure: ERCP, WITH INTERVENTION IF INDICATED;  Surgeon: Rollin Dover, MD;  Location: [facility] ENDOSCOPY;  Service: Gastroenterology;  Laterality: N/A;   ERCP N/A 10/04/2023   Procedure: ERCP, WITH INTERVENTION IF INDICATED;  Surgeon: Rollin Dover, MD;  Location: [facility] ENDOSCOPY;  Service: Gastroenterology;  Laterality: N/A;   ESOPHAGOGASTRODUODENOSCOPY N/A 03/19/2023   Procedure: ESOPHAGOGASTRODUODENOSCOPY (EGD);  Surgeon: Jinny Carmine, MD;  Location: [facility] ENDOSCOPY;  Service: Endoscopy;  Laterality: N/A;   ESOPHAGOGASTRODUODENOSCOPY N/A 10/04/2023   Procedure: EGD  (ESOPHAGOGASTRODUODENOSCOPY);  Surgeon: Rollin Dover, MD;  Location: [facility];  Service: Gastroenterology;  Laterality: N/A;   EUS N/A 10/04/2023   Procedure: ULTRASOUND, UPPER GI TRACT, ENDOSCOPIC;  Surgeon: Rollin Dover, MD;  Location: [facility] ENDOSCOPY;  Service: Gastroenterology;  Laterality: N/A;   JOINT REPLACEMENT Left    total knee replacement    Dr. Mardee   SPHINCTEROTOMY  07/21/2023   Procedure: ANNETT, GI;  Surgeon: Rollin Dover, MD;  Location: [facility] ENDOSCOPY;  Service: Gastroenterology;;   CLEDA REMOVAL  10/04/2023   Procedure: STENT REMOVAL;  Surgeon: Rollin Dover, MD;  Location: [facility]OSCOPY;  Service: Gastroenterology;;   TONSILLECTOMY     TOTAL KNEE ARTHROPLASTY Right 11/20/2021   Procedure: RIGHT TOTAL KNEE ARTHROPLASTY;  Surgeon: Liam Lerner, MD;  Location: [facility] ORS;  Service: Orthopedics;  Laterality: Right;   WISDOM TOOTH EXTRACTION     Social History:  reports that he has quit smoking. His smoking use included cigarettes. He has never used smokeless tobacco. He reports that he does not currently use alcohol. He reports that he does not use drugs.  No Known Allergies  Family History  Problem Relation Age of Onset   Brain cancer Father     Prior to Admission medications   Medication Sig Start Date End Date Taking? Authorizing Provider  acetaminophen  (TYLENOL ) 325 MG tablet Take 650 mg by mouth 3 (three) times daily as needed (pain).    [provider]  albuterol  (PROVENTIL  HFA;VENTOLIN  HFA) 108 (90 Base) MCG/ACT inhaler Inhale 2 puffs into the lungs every 6 (six) hours as needed for wheezing or shortness of breath. 12/29/17   Salary, Nemiah D, MD  aspirin  EC 81 MG tablet Take 81 mg by mouth daily.     [provider]  atenolol  (TENORMIN ) 25 MG tablet Take 1 tablet by mouth daily. 04/19/22 10/04/23  [provider]  balsalazide (COLAZAL ) 750 MG capsule Take 750 mg by mouth 3 (three) times daily. 09/24/17   [provider]  Biotin  1 MG CAPS Take 1 mg by mouth daily.    [provider]  Cholecalciferol (VITAMIN D3) 50 MCG (2000 UT) TABS Take 2,000 Units by mouth daily.    [provider]  cyanocobalamin  (VITAMIN B12) 1000 MCG tablet Take 1,000 mcg by mouth daily.    [provider]  Ferrous Sulfate (IRON  PO) Take 1 tablet by mouth daily.    [provider]  finasteride  (PROSCAR ) 5 MG tablet Take 5 mg by mouth daily. 12/12/17   [provider]  folic acid  (FOLVITE ) 1 MG tablet Take 1 mg by mouth daily. 07/06/14   [provider]  hydrALAZINE  (APRESOLINE ) 50 MG tablet Take 1 tablet (50 mg total) by mouth every 12 (twelve) hours. 07/26/23   Ezenduka, Nkeiruka J, MD  methotrexate  (RHEUMATREX) 2.5 MG tablet Take 2 tablets (5 mg total) by mouth once a week. Every Wednesday  Caution:Chemotherapy. Protect from light. 12/29/17   Salary, Nemiah BIRCH, MD  Multiple Vitamins-Minerals (MULTIVITAMIN WITH MINERALS) tablet Take 1 tablet by mouth 2 (two) times daily with breakfast and lunch.    [provider]  Multiple Vitamins-Minerals (PRESERVISION AREDS PO) Take 2 tablets by mouth daily.    [provider]  Omega-3 Fatty Acids (FISH OIL) 1000 MG CAPS Take 1,000 mg  by mouth daily.    [provider]  omeprazole  (PRILOSEC) 40 MG capsule Take 1 capsule (40 mg total) by mouth 2 (two) times daily before a meal. 03/22/23 10/04/23  Maree Hue, MD  prednisoLONE  acetate (PRED FORTE ) 1 % ophthalmic suspension Place 1 drop into the left eye at bedtime. 11/02/21   [provider]  pyridostigmine  (MESTINON ) 60 MG tablet Take 1 tablet (60 mg total) by mouth in the morning and at bedtime. See Rx fill hx and office visit note 01/18/23 07/26/23   Donnamarie Lebron PARAS, MD  tamsulosin  (FLOMAX ) 0.4 MG CAPS capsule Take 0.4 mg by mouth daily. 12/12/17   [provider]  traMADol  (ULTRAM ) 50 MG tablet Take 1 tablet (50 mg total) by mouth every 6 (six) hours as  needed. Patient not taking: Reported on 08/02/2023 07/26/23   Donnamarie Lebron PARAS, MD    Physical Exam: Vitals:   10/30/23 1525 10/30/23 1800  BP: 121/73 109/74  Pulse: (!) 102 83  Resp: 17 17  Temp: 99.5 F (37.5 C)   TempSrc: Oral   SpO2: 92% 96%  Weight: 88.5 kg   Height: 5' 10 (1.778 m)    Physical Exam  Labs on Admission: I have personally reviewed following labs and imaging studies  CBC: Recent Labs  Lab 10/30/23 1528  WBC 12.9*  HGB 11.5*  HCT 34.2*  MCV 93.7  PLT 381   Basic Metabolic Panel: Recent Labs  Lab 10/30/23 1528  NA 125*  K 4.0  CL 95*  CO2 18*  GLUCOSE 115*  BUN 15  CREATININE 0.77  CALCIUM 8.2*   GFR: Estimated Creatinine Clearance: 78.4 mL/min (by C-G formula based on SCr of 0.77 mg/dL). Liver Function Tests: Recent Labs  Lab 10/30/23 1528  AST 59*  ALT 55*  ALKPHOS 340*  BILITOT 1.7*  PROT 7.3  ALBUMIN 2.9*   No results for input(s): LIPASE, AMYLASE in the last 168 hours. No results for input(s): AMMONIA in the last 168 hours. Coagulation Profile: No results for input(s): INR, PROTIME in the last 168 hours. Cardiac Enzymes: No results for input(s): CKTOTAL, CKMB, CKMBINDEX, TROPONINI in the last 168 hours. BNP (last 3 results) No results for input(s): PROBNP in the last 8760 hours. HbA1C: No results for input(s): HGBA1C in the last 72 hours. CBG: No results for input(s): GLUCAP in the last 168 hours. Lipid Profile: No results for input(s): CHOL, HDL, LDLCALC, TRIG, CHOLHDL, LDLDIRECT in the last 72 hours. Thyroid  Function Tests: No results for input(s): TSH, T4TOTAL, FREET4, T3FREE, THYROIDAB in the last 72 hours. Anemia Panel: No results for input(s): VITAMINB12, FOLATE, FERRITIN, TIBC, IRON , RETICCTPCT in the last 72 hours. Urine analysis:    Component Value Date/Time   COLORURINE AMBER (A) 07/20/2023 1251   APPEARANCEUR CLEAR (A) 07/20/2023 1251   LABSPEC  1.019 07/20/2023 1251   PHURINE 6.0 07/20/2023 1251   GLUCOSEU NEGATIVE 07/20/2023 1251   HGBUR NEGATIVE 07/20/2023 1251   BILIRUBINUR NEGATIVE 07/20/2023 1251   KETONESUR NEGATIVE 07/20/2023 1251   PROTEINUR 30 (A) 07/20/2023 1251   NITRITE NEGATIVE 07/20/2023 1251   LEUKOCYTESUR NEGATIVE 07/20/2023 1251    Radiological Exams on Admission: DG Chest 2 View Result Date: 10/30/2023 CLINICAL DATA:  dyspnea EXAM: CHEST - 2 VIEW COMPARISON:  July 20, 2023 FINDINGS: No focal airspace consolidation, pleural effusion, or pneumothorax. No cardiomegaly. Hiatal hernia. Tortuous aorta with aortic atherosclerosis. No acute fracture or destructive lesions. Multilevel thoracic osteophytosis. IMPRESSION: No acute cardiopulmonary abnormality. Electronically Signed  By: Rogelia Myers M.D.   On: 10/30/2023 19:20   Data Reviewed for HPI: Relevant notes from primary care and specialist visits, past discharge summaries as available in EHR, including Care Everywhere. Prior diagnostic testing as pertinent to current admission diagnoses Updated medications and problem lists for reconciliation ED course, including vitals, labs, imaging, treatment and response to treatment Triage notes, nursing and pharmacy notes and ED provider's notes Notable results as noted above in HPI      Assessment and Plan: Biliary tract infection, suspected Possible sepsis S/p cholecystectomy, prior ERCP with biliary stent for acalculous cholecystitis 07/2023 Sepsis criteria include low-grade temp, tachycardia and leukocytosis, soft blood pressure Elevated LFTs in the setting of history of biliary stent June s/p cholecystectomy Sepsis fluids Empiric antibiotics with Zosyn  and Flagyl  for sepsis of intra-abdominal source Follow-up MRCP Follow cultures Will get GI consult in view of elevated LFTs  Will keep n.p.o. from midnight in case of need for procedure  Benign essential hypertension Blood pressure somewhat soft at 109/74  in the setting of sepsis so we will hold home atenolol  and hydralazine  for now  Acute metabolic acidosis Bicarb of 18 likely secondary to acute infection/sepsis Expecting improvement with hydration Continue to monitor.  Stricture and stenosis of esophagus No acute issues, no nausea or vomiting  Hard of hearing Increased communication needs  Myasthenia gravis (HCC) Continue pyridostigmine  Avoid meds likely to worsen symptoms  Chronic obstructive pulmonary disease (HCC) Not acutely exacerbated DuoNebs as needed  Benign enlargement of prostate Continue finasteride  and tamsulosin   Crohn's disease (HCC) Not acutely flared    DVT prophylaxis: SCD  Consults: GI  Advance Care Planning:   Code Status: Prior   Family Communication: none  Disposition Plan: Back to previous home environment  Severity of Illness: The appropriate patient status for this patient is OBSERVATION. Observation status is judged to be reasonable and necessary in order to provide the required intensity of service to ensure the patient's safety. The patient's presenting symptoms, physical exam findings, and initial radiographic and laboratory data in the context of their medical condition is felt to place them at decreased risk for further clinical deterioration. Furthermore, it is anticipated that the patient will be medically stable for discharge from the hospital within 2 midnights of admission.   Author: Delayne LULLA Solian, MD 10/30/2023 7:43 PM  For on call review [email or web address].

## 2023-10-31 DIAGNOSIS — E871 Hypo-osmolality and hyponatremia: Secondary | ICD-10-CM | POA: Diagnosis present

## 2023-10-31 DIAGNOSIS — R748 Abnormal levels of other serum enzymes: Secondary | ICD-10-CM | POA: Diagnosis present

## 2023-10-31 DIAGNOSIS — E8721 Acute metabolic acidosis: Secondary | ICD-10-CM | POA: Diagnosis present

## 2023-10-31 DIAGNOSIS — Z87891 Personal history of nicotine dependence: Secondary | ICD-10-CM | POA: Diagnosis not present

## 2023-10-31 DIAGNOSIS — T8579XA Infection and inflammatory reaction due to other internal prosthetic devices, implants and grafts, initial encounter: Secondary | ICD-10-CM | POA: Diagnosis present

## 2023-10-31 DIAGNOSIS — A419 Sepsis, unspecified organism: Secondary | ICD-10-CM | POA: Diagnosis present

## 2023-10-31 DIAGNOSIS — M1990 Unspecified osteoarthritis, unspecified site: Secondary | ICD-10-CM | POA: Diagnosis present

## 2023-10-31 DIAGNOSIS — H9190 Unspecified hearing loss, unspecified ear: Secondary | ICD-10-CM | POA: Diagnosis present

## 2023-10-31 DIAGNOSIS — K831 Obstruction of bile duct: Secondary | ICD-10-CM | POA: Diagnosis not present

## 2023-10-31 DIAGNOSIS — G7000 Myasthenia gravis without (acute) exacerbation: Secondary | ICD-10-CM | POA: Diagnosis present

## 2023-10-31 DIAGNOSIS — K222 Esophageal obstruction: Secondary | ICD-10-CM | POA: Diagnosis present

## 2023-10-31 DIAGNOSIS — K8309 Other cholangitis: Secondary | ICD-10-CM

## 2023-10-31 DIAGNOSIS — Z7982 Long term (current) use of aspirin: Secondary | ICD-10-CM | POA: Diagnosis not present

## 2023-10-31 DIAGNOSIS — K50919 Crohn's disease, unspecified, with unspecified complications: Secondary | ICD-10-CM | POA: Diagnosis not present

## 2023-10-31 DIAGNOSIS — R531 Weakness: Secondary | ICD-10-CM | POA: Diagnosis present

## 2023-10-31 DIAGNOSIS — K750 Abscess of liver: Secondary | ICD-10-CM | POA: Diagnosis present

## 2023-10-31 DIAGNOSIS — Y838 Other surgical procedures as the cause of abnormal reaction of the patient, or of later complication, without mention of misadventure at the time of the procedure: Secondary | ICD-10-CM | POA: Diagnosis present

## 2023-10-31 DIAGNOSIS — I4510 Unspecified right bundle-branch block: Secondary | ICD-10-CM | POA: Diagnosis present

## 2023-10-31 DIAGNOSIS — Z79631 Long term (current) use of antimetabolite agent: Secondary | ICD-10-CM | POA: Diagnosis not present

## 2023-10-31 DIAGNOSIS — Z79899 Other long term (current) drug therapy: Secondary | ICD-10-CM | POA: Diagnosis not present

## 2023-10-31 DIAGNOSIS — Z96653 Presence of artificial knee joint, bilateral: Secondary | ICD-10-CM | POA: Diagnosis present

## 2023-10-31 DIAGNOSIS — K5090 Crohn's disease, unspecified, without complications: Secondary | ICD-10-CM | POA: Diagnosis present

## 2023-10-31 DIAGNOSIS — Z9049 Acquired absence of other specified parts of digestive tract: Secondary | ICD-10-CM | POA: Diagnosis not present

## 2023-10-31 DIAGNOSIS — N400 Enlarged prostate without lower urinary tract symptoms: Secondary | ICD-10-CM | POA: Diagnosis present

## 2023-10-31 DIAGNOSIS — K219 Gastro-esophageal reflux disease without esophagitis: Secondary | ICD-10-CM | POA: Diagnosis present

## 2023-10-31 DIAGNOSIS — I10 Essential (primary) hypertension: Secondary | ICD-10-CM

## 2023-10-31 DIAGNOSIS — J439 Emphysema, unspecified: Secondary | ICD-10-CM | POA: Diagnosis present

## 2023-10-31 DIAGNOSIS — Z1152 Encounter for screening for COVID-19: Secondary | ICD-10-CM | POA: Diagnosis not present

## 2023-10-31 DIAGNOSIS — D649 Anemia, unspecified: Secondary | ICD-10-CM | POA: Diagnosis present

## 2023-10-31 LAB — COMPREHENSIVE METABOLIC PANEL WITH GFR
ALT: 41 U/L (ref 0–44)
AST: 38 U/L (ref 15–41)
Albumin: 2.5 g/dL — ABNORMAL LOW (ref 3.5–5.0)
Alkaline Phosphatase: 247 U/L — ABNORMAL HIGH (ref 38–126)
Anion gap: 8 (ref 5–15)
BUN: 14 mg/dL (ref 8–23)
CO2: 23 mmol/L (ref 22–32)
Calcium: 7.9 mg/dL — ABNORMAL LOW (ref 8.9–10.3)
Chloride: 103 mmol/L (ref 98–111)
Creatinine, Ser: 0.89 mg/dL (ref 0.61–1.24)
GFR, Estimated: >60 mL/min (ref >60)
Glucose, Bld: 83 mg/dL (ref 70–99)
Potassium: 3.7 mmol/L (ref 3.5–5.1)
Sodium: 134 mmol/L — ABNORMAL LOW (ref 135–145)
Total Bilirubin: 1.4 mg/dL — ABNORMAL HIGH (ref 0.0–1.2)
Total Protein: 6.2 g/dL — ABNORMAL LOW (ref 6.5–8.1)

## 2023-10-31 LAB — CBC
HCT: 30.2 % — ABNORMAL LOW (ref 39.0–52.0)
Hemoglobin: 10.1 g/dL — ABNORMAL LOW (ref 13.0–17.0)
MCH: 31.9 pg (ref 26.0–34.0)
MCHC: 33.4 g/dL (ref 30.0–36.0)
MCV: 95.3 fL (ref 80.0–100.0)
Platelets: 341 K/uL (ref 150–400)
RBC: 3.17 MIL/uL — ABNORMAL LOW (ref 4.22–5.81)
RDW: 15.8 % — ABNORMAL HIGH (ref 11.5–15.5)
WBC: 8.2 K/uL (ref 4.0–10.5)
nRBC: 0 % (ref 0.0–0.2)

## 2023-10-31 MED ORDER — SODIUM CHLORIDE 0.9 % IV SOLN
INTRAVENOUS | Status: AC
  Administered 2023-10-31: 10 mL via INTRAVENOUS

## 2023-10-31 NOTE — Progress Notes (Signed)
 PROGRESS NOTE    Kyle Holloway  FMW:[id number] DOB: [birth year] DOA: 10/30/2023 PCP: Diedra Lame, MD   Brief Narrative:   83 y.o. male with medical history significant for Crohn's disease, myasthenia gravis, esophageal stricture, COPD, BPH and hard of hearing, admitted in June 2025 with acute calculous cholecystitis s/p ERCP with stent, now s/p lap chole 07/25/2023, being admitted with suspected sepsis secondary to biliary source   9/25: MRCP. Concerning for possible stent infection/liver abscess. ID & GI c/s - ERCP tomorrow   Assessment & Plan:   Principal Problem:   Sepsis (HCC) Active Problems:   Biliary tract infection, suspected   Benign essential hypertension   S/P laparoscopic cholecystectomy 07/2023 prior ERCP with biliary stent for acalculous cholecystitis   Acute metabolic acidosis   Stricture and stenosis of esophagus   Crohn's disease (HCC)   Benign enlargement of prostate   Chronic obstructive pulmonary disease (HCC)   Myasthenia gravis (HCC)   Hard of hearing   Sepsis, unspecified organism (HCC)  Biliary tract infection, suspected Possible sepsis POA liked due to stent infection/liver abscess seen on MRCP S/p cholecystectomy, prior ERCP with biliary stent for acalculous cholecystitis 07/2023 Sepsis criteria include low-grade temp, tachycardia and leukocytosis, soft blood pressure Elevated LFTs in the setting of history of biliary stent June s/p cholecystectomy - continue Empiric antibiotics with Zosyn  and Flagyl  for sepsis of intra-abdominal source - MRCP concerning for small peripheral liver abscess and cholangitis. - Follow cultures - d/w Dr Jinny - n.p.o. from midnight for ERCP tomorrow by Dr Jinny - ID c/s - considering possible liver abscess seen on MRCP.   Benign essential hypertension Blood pressure somewhat soft at 109/74 in the setting of sepsis so we will hold home atenolol  and hydralazine  for now   Acute metabolic acidosis Bicarb of 18  likely secondary to acute infection/sepsis Expecting improvement with hydration   Stricture and stenosis of esophagus No acute issues, no nausea or vomiting   Hard of hearing Increased communication needs   Myasthenia gravis (HCC) Continue pyridostigmine  Avoid meds likely to worsen symptoms   Chronic obstructive pulmonary disease (HCC) Not acutely exacerbated DuoNebs as needed   Benign enlargement of prostate Continue finasteride  and tamsulosin    Crohn's disease (HCC) Not acutely flared       DVT prophylaxis: SCD's SCDs Start: 10/30/23 1954     Code Status: Full code Family Communication: wife updated at bedside Disposition Plan: possible D/C in 2-3 depending on clinical condition, ERCP, ID & GI input   Consultants:  GI  Procedures:  ERCP 9/26  Antimicrobials:  Cefepime   Flagyl     Subjective:  Would like to eat, weak  Objective: Vitals:   10/31/23 0818 10/31/23 1138 10/31/23 1554 10/31/23 2007  BP: 132/84 (!) 140/91 (!) 164/90 134/82  Pulse: 66 66 65 96  Resp: 18 18 17  (!) 24  Temp: 97.8 F (36.6 C) 98.3 F (36.8 C) 97.6 F (36.4 C) 97.6 F (36.4 C)  TempSrc: Oral Oral Oral   SpO2: 91% 96% 100% 99%  Weight:      Height:        Intake/Output Summary (Last 24 hours) at 10/31/2023 2014 Last data filed at 10/31/2023 1547 Gross per 24 hour  Intake 2545.4 ml  Output 1135 ml  Net 1410.4 ml   Filed Weights   10/30/23 1525  Weight: 88.5 kg    Examination:  General exam: Appears calm and comfortable  Respiratory system: Clear to auscultation. Respiratory effort normal. Cardiovascular system: S1 &  S2 heard, RRR. No murmurs. No pedal edema. Gastrointestinal system: Abdomen is soft, benign Central nervous system: Alert and oriented. No focal neurological deficits. Extremities: Symmetric 5 x 5 power. Skin: No rashes, lesions or ulcers Psychiatry: Judgement and insight appear normal. Mood & affect appropriate.     Data Reviewed: I have  personally reviewed following labs and imaging studies  CBC: Recent Labs  Lab 10/30/23 1528 10/31/23 0545  WBC 12.9* 8.2  HGB 11.5* 10.1*  HCT 34.2* 30.2*  MCV 93.7 95.3  PLT 381 341   Basic Metabolic Panel: Recent Labs  Lab 10/30/23 1528 10/31/23 0545  NA 125* 134*  K 4.0 3.7  CL 95* 103  CO2 18* 23  GLUCOSE 115* 83  BUN 15 14  CREATININE 0.77 0.89  CALCIUM 8.2* 7.9*   GFR: Estimated Creatinine Clearance: 70.4 mL/min (by C-G formula based on SCr of 0.89 mg/dL). Liver Function Tests: Recent Labs  Lab 10/30/23 1528 10/31/23 0545  AST 59* 38  ALT 55* 41  ALKPHOS 340* 247*  BILITOT 1.7* 1.4*  PROT 7.3 6.2*  ALBUMIN 2.9* 2.5*    Sepsis Labs: Recent Labs  Lab 10/30/23 1823  LATICACIDVEN 1.0    Recent Results (from the past 240 hours)  Resp panel by RT-PCR (RSV, Flu A&B, Covid) Anterior Nasal Swab     Status: None   Collection Time: 10/30/23  3:29 PM   Specimen: Anterior Nasal Swab  Result Value Ref Range Status   SARS Coronavirus 2 by RT PCR NEGATIVE NEGATIVE Final    Comment: (NOTE) SARS-CoV-2 target nucleic acids are NOT DETECTED.  The SARS-CoV-2 RNA is generally detectable in upper respiratory specimens during the acute phase of infection. The lowest concentration of SARS-CoV-2 viral copies this assay can detect is 138 copies/mL. A negative result does not preclude SARS-Cov-2 infection and should not be used as the sole basis for treatment or other patient management decisions. A negative result may occur with  improper specimen collection/handling, submission of specimen other than nasopharyngeal swab, presence of viral mutation(s) within the areas targeted by this assay, and inadequate number of viral copies(<138 copies/mL). A negative result must be combined with clinical observations, patient history, and epidemiological information. The expected result is Negative.  Fact Sheet for Patients:  BloggerCourse.com  Fact  Sheet for Healthcare Providers:  SeriousBroker.it  This test is no t yet approved or cleared by the [place]  FDA and  has been authorized for detection and/or diagnosis of SARS-CoV-2 by FDA under an Emergency Use Authorization (EUA). This EUA will remain  in effect (meaning this test can be used) for the duration of the COVID-19 declaration under Section 564(b)(1) of the Act, 21 U.S.C.section 360bbb-3(b)(1), unless the authorization is terminated  or revoked sooner.       Influenza A by PCR NEGATIVE NEGATIVE Final   Influenza B by PCR NEGATIVE NEGATIVE Final    Comment: (NOTE) The Xpert Xpress SARS-CoV-2/FLU/RSV plus assay is intended as an aid in the diagnosis of influenza from Nasopharyngeal swab specimens and should not be used as a sole basis for treatment. Nasal washings and aspirates are unacceptable for Xpert Xpress SARS-CoV-2/FLU/RSV testing.  Fact Sheet for Patients: BloggerCourse.com  Fact Sheet for Healthcare Providers: SeriousBroker.it  This test is not yet approved or cleared by the [place]  FDA and has been authorized for detection and/or diagnosis of SARS-CoV-2 by FDA under an Emergency Use Authorization (EUA). This EUA will remain in effect (meaning this test can be used) for the duration  of the COVID-19 declaration under Section 564(b)(1) of the Act, 21 U.S.C. section 360bbb-3(b)(1), unless the authorization is terminated or revoked.     Resp Syncytial Virus by PCR NEGATIVE NEGATIVE Final    Comment: (NOTE) Fact Sheet for Patients: BloggerCourse.com  Fact Sheet for Healthcare Providers: SeriousBroker.it  This test is not yet approved or cleared by the [place]  FDA and has been authorized for detection and/or diagnosis of SARS-CoV-2 by FDA under an Emergency Use Authorization (EUA). This EUA will remain in effect  (meaning this test can be used) for the duration of the COVID-19 declaration under Section 564(b)(1) of the Act, 21 U.S.C. section 360bbb-3(b)(1), unless the authorization is terminated or revoked.  Performed at [facility], [address] [place], [place] [address]   Blood culture (routine x 2)     Status: None (Preliminary result)   Collection Time: 10/30/23  6:23 PM   Specimen: BLOOD  Result Value Ref Range Status   Specimen Description BLOOD BLOOD RIGHT FOREARM  Final   Special Requests   Final    BOTTLES DRAWN AEROBIC AND ANAEROBIC Blood Culture results may not be optimal due to an inadequate volume of blood received in culture bottles   Culture   Final    NO GROWTH < 12 HOURS Performed at [facility], [address], [place], [place] [address]    Report Status PENDING  Incomplete  Blood culture (routine x 2)     Status: None (Preliminary result)   Collection Time: 10/30/23  6:23 PM   Specimen: BLOOD  Result Value Ref Range Status   Specimen Description BLOOD BLOOD LEFT FOREARM  Final   Special Requests   Final    BOTTLES DRAWN AEROBIC AND ANAEROBIC Blood Culture results may not be optimal due to an inadequate volume of blood received in culture bottles   Culture   Final    NO GROWTH < 12 HOURS Performed at [place] [facility], [address], [place], [place] [address]    Report Status PENDING  Incomplete         Radiology Studies: MR ABDOMEN MRCP W WO CONTAST Result Date: 10/31/2023 EXAM: MRCP WITH AND WITHOUT IV CONTRAST 10/30/2023 09:17:00 PM TECHNIQUE: Multisequence, multiplanar magnetic resonance images of the abdomen with and without intravenous contrast. MRCP sequences were performed. COMPARISON: 07/20/2023 CLINICAL HISTORY: RUQ abdominal pain, biliary disease suspected, no prior imaging; recent ERCP, cholecystectomy, elevated LFTs, evaluation for retained stone, abdominal pain. Per RN: Patient to ED via POV for generalized weakness and  chills. Patient states symptoms started this AM. Denies pain. Patient reports having his gallbladder removed approximately 1 month ago and needs stents placement. FINDINGS: LIVER: There is heterogeneous arterial phase enhancement within the liver. Within the periphery of the lateral segment of the left hepatic lobe there is an ovoid, rim enhancing, T2 hyperintense structure measuring 1.4 x 0.6 cm. This is new when compared with the examination from 07/20/2023. A mildly dilated biliary radical with mural enhancement in the lateral segment of the left lobe leads up to this new fluid collection, image 45 / 26. GALLBLADDER AND BILIARY SYSTEM: Status post cholecystectomy. Prominent cystic duct remnant is identified within the gallbladder fossa. A common bile duct stent is in place, which (along with motion artifact) limits assessment for underlying choledocholithiasis. There does appear to be some mural enhancement of the common bile duct as well as the central biliary radicals. Persistent mild intrahepatic bile duct dilatation. SPLEEN: Unremarkable. PANCREAS/PANCREATIC DUCT: Visualized pancreas is unremarkable.  No pancreatic ductal dilatation. ADRENAL GLANDS: Unremarkable. KIDNEYS: Multiple simple-appearing bilateral kidney cysts are noted measuring up to 2.2 cm. No follow-up imaging recommended LYMPH NODES: No upper abdominal adenopathy. 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 This measures 3.4 cm in maximum diameter. PERITONEUM: No ascites. ABDOMINAL WALL: No hernia. No mass. BOWEL: Dilated fluid-filled appendix which measures 1.6 cm, image 15 / 3 without surrounding inflammatory changes. This is similar in thickness when compared with 03/19/2023 but when compared with more remote studies from 2012 this is a new finding. Findings raise the question of underlying mucocele. Colonic diverticulosis without signs of acute diverticulitis. No bowel obstruction. BONES: No acute  abnormality or worrisome osseous lesion. SOFT TISSUES: Unremarkable. MISCELLANEOUS: Unremarkable. IMPRESSION: 1. New 1.4 x 0.6 cm ovoid rim-enhancing T2-hyperintense fluid collection in the peripheral lateral segment of the left hepatic lobe, with a mildly dilated biliary radical showing mural enhancement leading to this collection. Imaging findings are concerning for small peripheral liver abscess and cholangitis. 2. Status post cholecystectomy with prominent cystic duct remnant and common bile duct stent limiting assessment for choledocholithiasis; mural enhancement of the common bile duct and central biliary radicals. 3. Dilated fluid-filled appendix measuring 1.6 cm without surrounding inflammatory changes, raising concern for mucocele; unchanged from 03/19/23 but new compared to 2022. Recommend referral to surgery for further management. 4. 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 According to consensus criteria, follow-up imaging in 3 years is recommended. Electronically signed by: Waddell Calk MD 10/31/2023 05:03 AM EDT RP Workstation: [id number]   MR 3D Recon At Scanner Result Date: 10/31/2023 EXAM: MRCP WITH AND WITHOUT IV CONTRAST 10/30/2023 09:17:00 PM TECHNIQUE: Multisequence, multiplanar magnetic resonance images of the abdomen with and without intravenous contrast. MRCP sequences were performed. COMPARISON: 07/20/2023 CLINICAL HISTORY: RUQ abdominal pain, biliary disease suspected, no prior imaging; recent ERCP, cholecystectomy, elevated LFTs, evaluation for retained stone, abdominal pain. Per RN: Patient to ED via POV for generalized weakness and chills. Patient states symptoms started this AM. Denies pain. Patient reports having his gallbladder removed approximately 1 month ago and needs stents placement. FINDINGS: LIVER: There is heterogeneous arterial phase enhancement within the liver. Within the periphery of the lateral segment of the left hepatic lobe there is an  ovoid, rim enhancing, T2 hyperintense structure measuring 1.4 x 0.6 cm. This is new when compared with the examination from 07/20/2023. A mildly dilated biliary radical with mural enhancement in the lateral segment of the left lobe leads up to this new fluid collection, image 45 / 26. GALLBLADDER AND BILIARY SYSTEM: Status post cholecystectomy. Prominent cystic duct remnant is identified within the gallbladder fossa. A common bile duct stent is in place, which (along with motion artifact) limits assessment for underlying choledocholithiasis. There does appear to be some mural enhancement of the common bile duct as well as the central biliary radicals. Persistent mild intrahepatic bile duct dilatation. SPLEEN: Unremarkable. PANCREAS/PANCREATIC DUCT: Visualized pancreas is unremarkable. No pancreatic ductal dilatation. ADRENAL GLANDS: Unremarkable. KIDNEYS: Multiple simple-appearing bilateral kidney cysts are noted measuring up to 2.2 cm. No follow-up imaging recommended LYMPH NODES: No upper abdominal adenopathy. 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 This measures 3.4 cm in maximum diameter. PERITONEUM: No ascites. ABDOMINAL WALL: No hernia. No mass. BOWEL: Dilated fluid-filled appendix which measures 1.6 cm, image 15 / 3 without surrounding inflammatory changes. This is similar in thickness when compared with 03/19/2023 but when compared with more remote studies from 2012 this is a new  finding. Findings raise the question of underlying mucocele. Colonic diverticulosis without signs of acute diverticulitis. No bowel obstruction. BONES: No acute abnormality or worrisome osseous lesion. SOFT TISSUES: Unremarkable. MISCELLANEOUS: Unremarkable. IMPRESSION: 1. New 1.4 x 0.6 cm ovoid rim-enhancing T2-hyperintense fluid collection in the peripheral lateral segment of the left hepatic lobe, with a mildly dilated biliary radical showing mural enhancement leading to this  collection. Imaging findings are concerning for small peripheral liver abscess and cholangitis. 2. Status post cholecystectomy with prominent cystic duct remnant and common bile duct stent limiting assessment for choledocholithiasis; mural enhancement of the common bile duct and central biliary radicals. 3. Dilated fluid-filled appendix measuring 1.6 cm without surrounding inflammatory changes, raising concern for mucocele; unchanged from 03/19/23 but new compared to 2022. Recommend referral to surgery for further management. 4. 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 According to consensus criteria, follow-up imaging in 3 years is recommended. Electronically signed by: Waddell Calk MD 10/31/2023 05:03 AM EDT RP Workstation: [id number]   DG Chest 2 View Result Date: 10/30/2023 CLINICAL DATA:  dyspnea EXAM: CHEST - 2 VIEW COMPARISON:  July 20, 2023 FINDINGS: No focal airspace consolidation, pleural effusion, or pneumothorax. No cardiomegaly. Hiatal hernia. Tortuous aorta with aortic atherosclerosis. No acute fracture or destructive lesions. Multilevel thoracic osteophytosis. IMPRESSION: No acute cardiopulmonary abnormality. Electronically Signed   By: Rogelia Myers M.D.   On: 10/30/2023 19:20        Scheduled Meds:  aspirin  EC  81 mg Oral Daily   finasteride   5 mg Oral Daily   pantoprazole   40 mg Oral Daily   pyridostigmine   60 mg Oral BID   tamsulosin   0.4 mg Oral Daily   Continuous Infusions:  sodium chloride      ceFEPime  (MAXIPIME ) IV Stopped (10/31/23 1345)   metronidazole  500 mg (10/31/23 0800)     LOS: 0 days    Time spent: 35 mins    Regis Wiland Maree, MD [facility] Pager [phone]  If 7PM-7AM, please contact night-coverage [email or web address]  10/31/2023, 8:14 PM

## 2023-10-31 NOTE — Plan of Care (Signed)
  Problem: Clinical Measurements: Goal: Diagnostic test results will improve Outcome: Progressing   Problem: Respiratory: Goal: Ability to maintain adequate ventilation will improve Outcome: Progressing   Problem: Health Behavior/Discharge Planning: Goal: Ability to manage health-related needs will improve Outcome: Progressing   Problem: Clinical Measurements: Goal: Will remain free from infection Outcome: Progressing Goal: Cardiovascular complication will be avoided Outcome: Progressing

## 2023-10-31 NOTE — TOC Initial Note (Signed)
 Transition of Care (TOC) - Initial/Assessment Note    Patient Details  Name: Kyle JOHANSSON Sr. MRN: [id number] Date of Birth: 1940-04-09  Transition of Care [facility]) CM/SW Contact:    Dalia GORMAN Fuse, RN Phone Number: 10/31/2023, 1:46 PM  Clinical Narrative:                 Patient is from home. No TOC needs at this time. Please outreach to [facility] if needs are identified.        Patient Goals and CMS Choice            Expected Discharge Plan and Services                                              Prior Living Arrangements/Services                       Activities of Daily Living   ADL Screening (condition at time of admission) Independently performs ADLs?: Yes (appropriate for developmental age) Is the patient deaf or have difficulty hearing?: Yes Does the patient have difficulty seeing, even when wearing glasses/contacts?: No Does the patient have difficulty concentrating, remembering, or making decisions?: No  Permission Sought/Granted                  Emotional Assessment              Admission diagnosis:  Sepsis, unspecified organism [facility]) [A41.9] Patient Active Problem List   Diagnosis Date Noted   S/P laparoscopic cholecystectomy 07/2023 prior ERCP with biliary stent for acalculous cholecystitis 10/30/2023   Hard of hearing 10/30/2023   Sepsis, unspecified organism (HCC) 10/30/2023   Acute metabolic acidosis 10/30/2023   Myasthenia gravis (HCC)    Biliary tract infection, suspected 07/21/2023   Abdominal pain 07/20/2023   Sepsis (HCC) 07/20/2023   Gallbladder calculus with acute cholecystitis 07/20/2023   Biliary stricture 07/20/2023   Symptomatic anemia 05/31/2023   GI bleeding 03/20/2023   Acute GI bleeding 03/19/2023   Rectal bleeding 03/19/2023   Osteoarthritis of right knee 11/15/2021   Chronic obstructive pulmonary disease (HCC) 01/13/2018   Benign essential hypertension 01/07/2018   Crohn's colitis (HCC)  01/07/2018   Hyperlipidemia LDL goal <130 01/07/2018   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 [place] [address] Phone: [phone] Fax: [phone]  [facility] [address] [place] [address] Phone: [phone] Fax: [phone]  [facility] [address] [place] [address] Phone: [phone] Fax: [phone]     Social Drivers of Health (SDOH) Social History: SDOH Screenings   Food Insecurity: No Food Insecurity (10/30/2023)  Housing: Low Risk  (10/30/2023)  Transportation Needs: No Transportation Needs (10/30/2023)  Utilities: Not At Risk (10/30/2023)  Depression (PHQ2-9): Low Risk  (05/31/2023)  Financial Resource Strain: Patient Declined (02/07/2023)   Received from [facility]  Social Connections: Socially Integrated (10/30/2023)  Tobacco Use: Medium Risk (10/30/2023)   SDOH  Interventions:     Readmission Risk Interventions     No data to display

## 2023-10-31 NOTE — Plan of Care (Signed)
  Problem: Clinical Measurements: Goal: Diagnostic test results will improve 10/31/2023 0712 by Roark Selinda CROME, RN Outcome: Progressing 10/31/2023 0712 by Roark Selinda CROME, RN Outcome: Progressing   Problem: Respiratory: Goal: Ability to maintain adequate ventilation will improve 10/31/2023 0712 by Roark Selinda CROME, RN Outcome: Progressing 10/31/2023 0712 by Roark Selinda CROME, RN Outcome: Progressing   Problem: Health Behavior/Discharge Planning: Goal: Ability to manage health-related needs will improve Outcome: Progressing   Problem: Clinical Measurements: Goal: Ability to maintain clinical measurements within normal limits will improve Outcome: Progressing Goal: Will remain free from infection Outcome: Progressing Goal: Cardiovascular complication will be avoided 10/31/2023 [id number] by Roark Selinda CROME, RN Outcome: Progressing 10/31/2023 0712 by Roark Selinda CROME, RN Outcome: Progressing

## 2023-11-01 ENCOUNTER — Encounter
Admission: EM | Disposition: A | Discharge status: Home / Self Care | Payer: Self-pay | Source: Home / Self Care | Attending: Internal Medicine

## 2023-11-01 ENCOUNTER — Inpatient Hospital Stay: Admitting: Registered Nurse

## 2023-11-01 ENCOUNTER — Encounter: Payer: Self-pay | Admitting: Internal Medicine

## 2023-11-01 ENCOUNTER — Other Ambulatory Visit: Payer: Self-pay | Admitting: Gastroenterology

## 2023-11-01 ENCOUNTER — Inpatient Hospital Stay

## 2023-11-01 DIAGNOSIS — K831 Obstruction of bile duct: Secondary | ICD-10-CM | POA: Diagnosis not present

## 2023-11-01 DIAGNOSIS — A419 Sepsis, unspecified organism: Secondary | ICD-10-CM | POA: Diagnosis not present

## 2023-11-01 DIAGNOSIS — K750 Abscess of liver: Secondary | ICD-10-CM

## 2023-11-01 DIAGNOSIS — K222 Esophageal obstruction: Secondary | ICD-10-CM

## 2023-11-01 DIAGNOSIS — K50919 Crohn's disease, unspecified, with unspecified complications: Secondary | ICD-10-CM | POA: Diagnosis not present

## 2023-11-01 DIAGNOSIS — N401 Enlarged prostate with lower urinary tract symptoms: Secondary | ICD-10-CM

## 2023-11-01 DIAGNOSIS — K8309 Other cholangitis: Secondary | ICD-10-CM

## 2023-11-01 DIAGNOSIS — I10 Essential (primary) hypertension: Secondary | ICD-10-CM | POA: Diagnosis not present

## 2023-11-01 HISTORY — PX: ERCP: SHX5425

## 2023-11-01 HISTORY — PX: STENT REMOVAL: SHX6421

## 2023-11-01 LAB — COMPREHENSIVE METABOLIC PANEL WITH GFR
ALT: 39 U/L (ref 0–44)
AST: 40 U/L (ref 15–41)
Albumin: 2.5 g/dL — ABNORMAL LOW (ref 3.5–5.0)
Alkaline Phosphatase: 270 U/L — ABNORMAL HIGH (ref 38–126)
Anion gap: 10 (ref 5–15)
BUN: 15 mg/dL (ref 8–23)
CO2: 21 mmol/L — ABNORMAL LOW (ref 22–32)
Calcium: 8.1 mg/dL — ABNORMAL LOW (ref 8.9–10.3)
Chloride: 104 mmol/L (ref 98–111)
Creatinine, Ser: 1.01 mg/dL (ref 0.61–1.24)
GFR, Estimated: >60 mL/min (ref >60)
Glucose, Bld: 84 mg/dL (ref 70–99)
Potassium: 3.7 mmol/L (ref 3.5–5.1)
Sodium: 135 mmol/L (ref 135–145)
Total Bilirubin: 1.2 mg/dL (ref 0.0–1.2)
Total Protein: 6.7 g/dL (ref 6.5–8.1)

## 2023-11-01 LAB — CBC
HCT: 32.1 % — ABNORMAL LOW (ref 39.0–52.0)
Hemoglobin: 10.8 g/dL — ABNORMAL LOW (ref 13.0–17.0)
MCH: 31.4 pg (ref 26.0–34.0)
MCHC: 33.6 g/dL (ref 30.0–36.0)
MCV: 93.3 fL (ref 80.0–100.0)
Platelets: 394 K/uL (ref 150–400)
RBC: 3.44 MIL/uL — ABNORMAL LOW (ref 4.22–5.81)
RDW: 15.6 % — ABNORMAL HIGH (ref 11.5–15.5)
WBC: 7.1 K/uL (ref 4.0–10.5)
nRBC: 0 % (ref 0.0–0.2)

## 2023-11-01 SURGERY — ERCP, WITH INTERVENTION IF INDICATED
Anesthesia: General

## 2023-11-01 MED ORDER — DEXMEDETOMIDINE HCL IN NACL 80 MCG/20ML IV SOLN
INTRAVENOUS | Status: DC | PRN
  Administered 2023-11-01 (×2): 4 ug via INTRAVENOUS
  Administered 2023-11-01: 8 ug via INTRAVENOUS

## 2023-11-01 MED ORDER — INFLUENZA VAC SPLIT HIGH-DOSE 0.5 ML IM SUSY
0.5000 mL | PREFILLED_SYRINGE | INTRAMUSCULAR | Status: DC
  Filled 2023-11-01: qty 0.5

## 2023-11-01 MED ORDER — PROPOFOL 10 MG/ML IV BOLUS
INTRAVENOUS | Status: DC | PRN
  Administered 2023-11-01: 100 ug/kg/min via INTRAVENOUS
  Administered 2023-11-01: 125 ug/kg/min via INTRAVENOUS
  Administered 2023-11-01: 60 mg via INTRAVENOUS

## 2023-11-01 MED ORDER — LACTATED RINGERS IV SOLN: INTRAVENOUS | Status: DC | PRN

## 2023-11-01 MED ORDER — LIDOCAINE HCL (CARDIAC) PF 100 MG/5ML IV SOSY
PREFILLED_SYRINGE | INTRAVENOUS | Status: DC | PRN
  Administered 2023-11-01: 100 mg via INTRAVENOUS

## 2023-11-01 NOTE — Anesthesia Preprocedure Evaluation (Signed)
 Anesthesia Evaluation  Patient identified by MRN, date of birth, ID band Patient awake    Reviewed: Allergy & Precautions, NPO status , Patient's Chart, lab work & pertinent test results  Airway Mallampati: II  TM Distance: >3 FB Neck ROM: full    Dental  (+) Edentulous Upper, Edentulous Lower   Pulmonary neg pulmonary ROS, COPD, former smoker   Pulmonary exam normal  + decreased breath sounds      Cardiovascular Exercise Tolerance: Good hypertension, Pt. on medications negative cardio ROS Normal cardiovascular exam Rhythm:Regular Rate:Normal     Neuro/Psych negative neurological ROS  negative psych ROS   GI/Hepatic negative GI ROS, Neg liver ROS,GERD  Medicated,,  Endo/Other  negative endocrine ROS    Renal/GU negative Renal ROS  negative genitourinary   Musculoskeletal  (+) Arthritis ,    Abdominal Normal abdominal exam  (+)   Peds negative pediatric ROS (+)  Hematology negative hematology ROS (+) Blood dyscrasia, anemia   Anesthesia Other Findings Past Medical History: No date: Arthritis No date: COPD (chronic obstructive pulmonary disease) (HCC) No date: Crohn's disease (HCC) No date: Emphysema, unspecified (HCC) No date: Hypertension No date: Myasthenia gravis [facility])  Past Surgical History: 07/21/2023: BILIARY BRUSHING     Comment:  Procedure: BRUSH BIOPSY, BILE DUCT;  Surgeon: Rollin Dover, MD;  Location: [facility] ENDOSCOPY;  Service:               Gastroenterology;; 10/04/2023: BILIARY BRUSHING     Comment:  Procedure: BRUSH BIOPSY, BILE DUCT;  Surgeon: Rollin Dover, MD;  Location: [facility] ENDOSCOPY;  Service:               Gastroenterology;; 07/21/2023: BILIARY STENT PLACEMENT     Comment:  Procedure: INSERTION, STENT, BILE DUCT;  Surgeon: Rollin Dover, MD;  Location: [facility] ENDOSCOPY;  Service:               Gastroenterology;; 10/04/2023: BILIARY STENT PLACEMENT; N/A      Comment:  Procedure: INSERTION, STENT, BILE DUCT;  Surgeon: Rollin Dover, MD;  Location: [facility] ENDOSCOPY;  Service:               Gastroenterology;  Laterality: N/A; 07/25/2023: CHOLECYSTECTOMY; N/A     Comment:  Procedure: LAPAROSCOPIC CHOLECYSTECTOMY;  Surgeon:               Ebbie Cough, MD;  Location: [facility] OR;  Service:               General;  Laterality: N/A;  WITH ICG DYE 03/20/2023: COLONOSCOPY WITH PROPOFOL ; N/A     Comment:  Procedure: COLONOSCOPY WITH PROPOFOL ;  Surgeon: Jinny Carmine, MD;  Location: [facility] ENDOSCOPY;  Service:               Endoscopy;  Laterality: N/A; 03/21/2023: EMBOLIZATION (CATH LAB); N/A     Comment:  Procedure: EMBOLIZATION;  Surgeon: Jama Cordella MATSU,               MD;  Location: [facility] INVASIVE CV LAB;  Service:  Cardiovascular;  Laterality: N/A; 07/21/2023: ERCP; N/A     Comment:  Procedure: ERCP, WITH INTERVENTION IF INDICATED;                Surgeon: Rollin Dover, MD;  Location: [facility] ENDOSCOPY;                Service: Gastroenterology;  Laterality: N/A; 10/04/2023: ERCP; N/A     Comment:  Procedure: ERCP, WITH INTERVENTION IF INDICATED;                Surgeon: Rollin Dover, MD;  Location: [facility] ENDOSCOPY;                Service: Gastroenterology;  Laterality: N/A; 03/19/2023: ESOPHAGOGASTRODUODENOSCOPY; N/A     Comment:  Procedure: ESOPHAGOGASTRODUODENOSCOPY (EGD);  Surgeon:               Jinny Carmine, MD;  Location: [facility] ENDOSCOPY;  Service:               Endoscopy;  Laterality: N/A; 10/04/2023: ESOPHAGOGASTRODUODENOSCOPY; N/A     Comment:  Procedure: EGD (ESOPHAGOGASTRODUODENOSCOPY);  Surgeon:               Rollin Dover, MD;  Location: [facility] ENDOSCOPY;  Service:               Gastroenterology;  Laterality: N/A; 10/04/2023: EUS; N/A     Comment:  Procedure: ULTRASOUND, UPPER GI TRACT, ENDOSCOPIC;                Surgeon: Rollin Dover, MD;  Location: [facility] ENDOSCOPY;                Service: Gastroenterology;   Laterality: N/A; No date: JOINT REPLACEMENT; Left     Comment:  total knee replacement    Dr. Mardee 07/21/2023: SPHINCTEROTOMY     Comment:  Procedure: ANNETT, GI;  Surgeon: Rollin Dover,               MD;  Location: [facility] ENDOSCOPY;  Service: Gastroenterology;; 10/04/2023: CLEDA REMOVAL     Comment:  Procedure: STENT REMOVAL;  Surgeon: Rollin Dover, MD;                Location: [facility] ENDOSCOPY;  Service: Gastroenterology;; No date: TONSILLECTOMY 11/20/2021: TOTAL KNEE ARTHROPLASTY; Right     Comment:  Procedure: RIGHT TOTAL KNEE ARTHROPLASTY;  Surgeon:               Liam Lerner, MD;  Location: [facility] ORS;  Service:               Orthopedics;  Laterality: Right; No date: WISDOM TOOTH EXTRACTION  BMI    Body Mass Index: 27.98 kg/m      Reproductive/Obstetrics negative OB ROS                              Anesthesia Physical Anesthesia Plan  ASA: 3  Anesthesia Plan: General   Post-op Pain Management:    Induction: Intravenous  PONV Risk Score and Plan: Propofol  infusion and TIVA  Airway Management Planned: Natural Airway and Nasal Cannula  Additional Equipment:   Intra-op Plan:   Post-operative Plan:   Informed Consent: I have reviewed the patients History and Physical, chart, labs and discussed the procedure including the risks, benefits and alternatives for the proposed anesthesia with the patient or authorized representative who has indicated his/her understanding and acceptance.     Dental Advisory Given  Plan Discussed  with: CRNA  Anesthesia Plan Comments:         Anesthesia Quick Evaluation

## 2023-11-01 NOTE — Op Note (Addendum)
 [facility] Gastroenterology Patient Name: Kyle Holloway Procedure Date: 11/01/2023 11:54 AM MRN: [id number] Account #: [id number] Date of Birth: 03-22-1940 Admit Type: Inpatient Age: 83 Room: [facility] ENDO ROOM 4 Gender: Male Note Status: Finalized Instrument Name: CELINDA [id number] Procedure:             ERCP Indications:           Suspected ascending cholangitis, Elevated liver enzymes Providers:             [name] MD, MD Referring MD:          [name] MD (Referring MD) Medicines:             Propofol  per Anesthesia Complications:         No immediate complications. Procedure:             Pre-Anesthesia Assessment:                        - Prior to the procedure, a History and Physical was                         performed, and patient medications and allergies were                         reviewed. The patient's tolerance of previous                         anesthesia was also reviewed. The risks and benefits                         of the procedure and the sedation options and risks                         were discussed with the patient. All questions were                         answered, and informed consent was obtained. Prior                         Anticoagulants: The patient has taken no anticoagulant                         or antiplatelet agents. ASA Grade Assessment: II - A                         patient with mild systemic disease. After reviewing                         the risks and benefits, the patient was deemed in                         satisfactory condition to undergo the procedure.                        After obtaining informed consent, the scope was passed                         under direct vision. Throughout the procedure, the  patient's blood pressure, pulse, and oxygen                         saturations were monitored continuously. The                         Duodenoscope was introduced through  the mouth, and                         used to inject contrast into and used to inject                         contrast into the bile duct. The ERCP was accomplished                         without difficulty. The patient tolerated the                         procedure well. Findings:      A biliary stent was visible on the scout film. The esophagus was       successfully intubated under direct vision. The scope was advanced to a       normal major papilla in the descending duodenum without detailed       examination of the pharynx, larynx and associated structures, and upper       GI tract. The upper GI tract was grossly normal. Two stents were removed       from the common bile duct using a snare. The bile duct was deeply       cannulated with the short-nosed traction sphincterotome. Contrast was       injected. I personally interpreted the bile duct images. There was brisk       flow of contrast through the ducts. Image quality was excellent.       Contrast extended to the entire biliary tree. The middle third of the       main bile duct contained a single segmental stenosis. A wire was passed       into the biliary tree. The biliary tree was swept with a 15 mm balloon       starting at the bifurcation. Sludge was swept from the duct. Cells for       cytology were obtained by brushing in the middle third of the main bile       duct. One 10 Fr by 9 cm plastic stent with a single external flap and a       single internal flap was placed 7 cm into the common bile duct. Bile       flowed through the stent. The stent was in good position. Impression:            - A single segmental biliary stricture was found in                         the middle third of the main bile duct.                        - Two stents were removed from the common bile duct.                        -  The biliary tree was swept and sludge was found.                        - Cells for cytology obtained in the middle  third of                         the main bile duct.                        - One plastic stent was placed into the common bile                         duct.                        - - These finding are consistant with possible PSC and                         that should be worked up by his genreal GI doc. Recommendation:        - Return patient to hospital ward for ongoing care.                        - Resume regular diet.                        - Continue present medications.                        - Watch for pancreatitis, bleeding, perforation, and                         cholangitis. Procedure Code(s):     --- Professional ---                        [phone], Endoscopic retrograde cholangiopancreatography                         (ERCP); with removal and exchange of stent(s), biliary                         or pancreatic duct, including pre- and post-dilation                         and guide wire passage, when performed, including                         sphincterotomy, when performed, each stent exchanged                        [id number], Endoscopic retrograde cholangiopancreatography                         (ERCP); with removal of calculi/debris from                         biliary/pancreatic duct(s)                        25671, Endoscopic catheterization of the biliary  ductal system, radiological supervision and                         interpretation Diagnosis Code(s):     --- Professional ---                        K83.1, Obstruction of bile duct                        R74.8, Abnormal levels of other serum enzymes CPT copyright 2022 American [employer] Association. All rights reserved. The codes documented in this report are preliminary and upon coder review may  be revised to meet current compliance requirements. [name] MD, MD 11/01/2023 1:17:29 PM This report has been signed electronically. Number of Addenda: 0 Note Initiated On: 11/01/2023 11:54  AM Estimated Blood Loss:  Estimated blood loss: none.      [facility]

## 2023-11-01 NOTE — Plan of Care (Signed)

## 2023-11-01 NOTE — Progress Notes (Signed)
 [facility]  - [place] [facility]   PATIENT NAME: Kyle Holloway    MR#:  [id number]  DATE OF BIRTH:  [birth year]  SUBJECTIVE:  wife at bedside. Patient underwent ERCP today. Denies any pain. Wondering when he can go home. Afebrile. Getting IV antibiotics for cholangitis/questionable liver abscess    VITALS:  Blood pressure (!) 144/86, pulse 82, temperature (!) 96.9 F (36.1 C), temperature source Temporal, resp. rate (!) 33, height 5' 10 (1.778 m), weight 88.5 kg, SpO2 96%.  PHYSICAL EXAMINATION:   GENERAL:  83 y.o.-year-old patient with no acute distress.  LUNGS: Normal breath sounds bilaterally, no wheezing CARDIOVASCULAR: S1, S2 normal. No murmur   ABDOMEN: Soft, nontender, nondistended. Bowel sounds present.  EXTREMITIES: No  edema b/l.    NEUROLOGIC: nonfocal  patient is alert and awake SKIN: No obvious rash, lesion, or ulcer.   LABORATORY PANEL:  CBC Recent Labs  Lab 11/01/23 0542  WBC 7.1  HGB 10.8*  HCT 32.1*  PLT 394    Chemistries  Recent Labs  Lab 11/01/23 0542  NA 135  K 3.7  CL 104  CO2 21*  GLUCOSE 84  BUN 15  CREATININE 1.01  CALCIUM 8.1*  AST 40  ALT 39  ALKPHOS 270*  BILITOT 1.2   Cardiac Enzymes No results for input(s): TROPONINI in the last 168 hours. RADIOLOGY:  DG C-Arm 1-60 Min-No Report Result Date: 11/01/2023 Fluoroscopy was utilized by the requesting physician.  No radiographic interpretation.   MR ABDOMEN MRCP W WO CONTAST Result Date: 10/31/2023 EXAM: MRCP WITH AND WITHOUT IV CONTRAST 10/30/2023 09:17:00 PM TECHNIQUE: Multisequence, multiplanar magnetic resonance images of the abdomen with and without intravenous contrast. MRCP sequences were performed. COMPARISON: 07/20/2023 CLINICAL HISTORY: RUQ abdominal pain, biliary disease suspected, no prior imaging; recent ERCP, cholecystectomy, elevated LFTs, evaluation for retained stone, abdominal pain. Per RN: Patient to ED via POV for generalized weakness and  chills. Patient states symptoms started this AM. Denies pain. Patient reports having his gallbladder removed approximately 1 month ago and needs stents placement. FINDINGS: LIVER: There is heterogeneous arterial phase enhancement within the liver. Within the periphery of the lateral segment of the left hepatic lobe there is an ovoid, rim enhancing, T2 hyperintense structure measuring 1.4 x 0.6 cm. This is new when compared with the examination from 07/20/2023. A mildly dilated biliary radical with mural enhancement in the lateral segment of the left lobe leads up to this new fluid collection, image 45 / 26. GALLBLADDER AND BILIARY SYSTEM: Status post cholecystectomy. Prominent cystic duct remnant is identified within the gallbladder fossa. A common bile duct stent is in place, which (along with motion artifact) limits assessment for underlying choledocholithiasis. There does appear to be some mural enhancement of the common bile duct as well as the central biliary radicals. Persistent mild intrahepatic bile duct dilatation. SPLEEN: Unremarkable. PANCREAS/PANCREATIC DUCT: Visualized pancreas is unremarkable. No pancreatic ductal dilatation. ADRENAL GLANDS: Unremarkable. KIDNEYS: Multiple simple-appearing bilateral kidney cysts are noted measuring up to 2.2 cm. No follow-up imaging recommended LYMPH NODES: No upper abdominal adenopathy. 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 This measures 3.4 cm in maximum diameter. PERITONEUM: No ascites. ABDOMINAL WALL: No hernia. No mass. BOWEL: Dilated fluid-filled appendix which measures 1.6 cm, image 15 / 3 without surrounding inflammatory changes. This is similar in thickness when compared with 03/19/2023 but when compared with more remote studies from 2012 this is a new finding. Findings raise the question of underlying mucocele. Colonic  diverticulosis without signs of acute diverticulitis. No bowel obstruction. BONES: No acute  abnormality or worrisome osseous lesion. SOFT TISSUES: Unremarkable. MISCELLANEOUS: Unremarkable. IMPRESSION: 1. New 1.4 x 0.6 cm ovoid rim-enhancing T2-hyperintense fluid collection in the peripheral lateral segment of the left hepatic lobe, with a mildly dilated biliary radical showing mural enhancement leading to this collection. Imaging findings are concerning for small peripheral liver abscess and cholangitis. 2. Status post cholecystectomy with prominent cystic duct remnant and common bile duct stent limiting assessment for choledocholithiasis; mural enhancement of the common bile duct and central biliary radicals. 3. Dilated fluid-filled appendix measuring 1.6 cm without surrounding inflammatory changes, raising concern for mucocele; unchanged from 03/19/23 but new compared to 2022. Recommend referral to surgery for further management. 4. 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 According to consensus criteria, follow-up imaging in 3 years is recommended. Electronically signed by: Waddell Calk MD 10/31/2023 05:03 AM EDT RP Workstation: [id number]   MR 3D Recon At Scanner Result Date: 10/31/2023 EXAM: MRCP WITH AND WITHOUT IV CONTRAST 10/30/2023 09:17:00 PM TECHNIQUE: Multisequence, multiplanar magnetic resonance images of the abdomen with and without intravenous contrast. MRCP sequences were performed. COMPARISON: 07/20/2023 CLINICAL HISTORY: RUQ abdominal pain, biliary disease suspected, no prior imaging; recent ERCP, cholecystectomy, elevated LFTs, evaluation for retained stone, abdominal pain. Per RN: Patient to ED via POV for generalized weakness and chills. Patient states symptoms started this AM. Denies pain. Patient reports having his gallbladder removed approximately 1 month ago and needs stents placement. FINDINGS: LIVER: There is heterogeneous arterial phase enhancement within the liver. Within the periphery of the lateral segment of the left hepatic lobe there is an  ovoid, rim enhancing, T2 hyperintense structure measuring 1.4 x 0.6 cm. This is new when compared with the examination from 07/20/2023. A mildly dilated biliary radical with mural enhancement in the lateral segment of the left lobe leads up to this new fluid collection, image 45 / 26. GALLBLADDER AND BILIARY SYSTEM: Status post cholecystectomy. Prominent cystic duct remnant is identified within the gallbladder fossa. A common bile duct stent is in place, which (along with motion artifact) limits assessment for underlying choledocholithiasis. There does appear to be some mural enhancement of the common bile duct as well as the central biliary radicals. Persistent mild intrahepatic bile duct dilatation. SPLEEN: Unremarkable. PANCREAS/PANCREATIC DUCT: Visualized pancreas is unremarkable. No pancreatic ductal dilatation. ADRENAL GLANDS: Unremarkable. KIDNEYS: Multiple simple-appearing bilateral kidney cysts are noted measuring up to 2.2 cm. No follow-up imaging recommended LYMPH NODES: No upper abdominal adenopathy. 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 This measures 3.4 cm in maximum diameter. PERITONEUM: No ascites. ABDOMINAL WALL: No hernia. No mass. BOWEL: Dilated fluid-filled appendix which measures 1.6 cm, image 15 / 3 without surrounding inflammatory changes. This is similar in thickness when compared with 03/19/2023 but when compared with more remote studies from 2012 this is a new finding. Findings raise the question of underlying mucocele. Colonic diverticulosis without signs of acute diverticulitis. No bowel obstruction. BONES: No acute abnormality or worrisome osseous lesion. SOFT TISSUES: Unremarkable. MISCELLANEOUS: Unremarkable. IMPRESSION: 1. New 1.4 x 0.6 cm ovoid rim-enhancing T2-hyperintense fluid collection in the peripheral lateral segment of the left hepatic lobe, with a mildly dilated biliary radical showing mural enhancement leading to this  collection. Imaging findings are concerning for small peripheral liver abscess and cholangitis. 2. Status post cholecystectomy with prominent cystic duct remnant and common bile duct stent limiting assessment for choledocholithiasis; mural enhancement of the common bile duct and  central biliary radicals. 3. Dilated fluid-filled appendix measuring 1.6 cm without surrounding inflammatory changes, raising concern for mucocele; unchanged from 03/19/23 but new compared to 2022. Recommend referral to surgery for further management. 4. 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 According to consensus criteria, follow-up imaging in 3 years is recommended. Electronically signed by: Waddell Calk MD 10/31/2023 05:03 AM EDT RP Workstation: [id number]   DG Chest 2 View Result Date: 10/30/2023 CLINICAL DATA:  dyspnea EXAM: CHEST - 2 VIEW COMPARISON:  July 20, 2023 FINDINGS: No focal airspace consolidation, pleural effusion, or pneumothorax. No cardiomegaly. Hiatal hernia. Tortuous aorta with aortic atherosclerosis. No acute fracture or destructive lesions. Multilevel thoracic osteophytosis. IMPRESSION: No acute cardiopulmonary abnormality. Electronically Signed   By: Rogelia Myers M.D.   On: 10/30/2023 19:20    Assessment and Plan  83 y.o. male with medical history significant for Crohn's disease, myasthenia gravis, esophageal stricture, COPD, BPH and hard of hearing, admitted in June 2025 with acute calculous cholecystitis s/p ERCP with stent, now s/p lap chole 07/25/2023, being admitted with suspected sepsis secondary to biliary source    9/25: MRCP. Concerning for possible stent infection/liver abscess. ID & GI c/s - ERCP tomorrow 9/26 s/p ERCP today mm no pain. Wants to go home   Biliary tract infection, suspected Possible sepsis POA liked due to stent infection/liver abscess seen on MRCP S/p cholecystectomy, prior ERCP with biliary stent for biliary stricture and h/o acalculous  cholecystitis 07/2023 --Sepsis criteria include low-grade temp, tachycardia and leukocytosis, soft blood pressure Elevated LFTs in the setting of history of biliary stent June s/p cholecystectomy - continue Empiric antibiotics with Zosyn  and Flagyl  for sepsis of intra-abdominal source - MRCP concerning for small peripheral liver abscess and cholangitis. - Follow cultures - d/w Dr Jinny --ERCP  - A single segmental biliary stricture was found in                         the middle third of the main bile duct.                        - Two stents were removed from the common bile duct.                        - The biliary tree was swept and sludge was found.                        - Cells for cytology obtained in the middle third of                         the main bile duct.                        - One plastic stent was placed into the common bile                         duct.                        - - These finding are consistant with possible PSC and                         that should be worked up by his genreal GI doc (  dr Rollin in [place]) - ID c/s - considering possible liver abscess seen on MRCP.   Benign essential hypertension --Blood pressure somewhat soft at 109/74 in the setting of sepsis so we will hold home atenolol  and hydralazine  for now   Acute metabolic acidosis --Bicarb of 18 likely secondary to acute infection/sepsis -- improved   Stricture and stenosis of esophagus No acute issues, no nausea or vomiting   Hard of hearing Increased communication needs   Myasthenia gravis (HCC) --Continue pyridostigmine  Avoid meds likely to worsen symptoms   Chronic obstructive pulmonary disease (HCC) --Not acutely exacerbated --DuoNebs as needed   Benign enlargement of prostate --Continue finasteride  and tamsulosin    Crohn's disease (HCC) --Not acutely flared   Procedures: ERCP Family communication : wife at bedside Consults : G.I. CODE STATUS: full DVT Prophylaxis :  SCD Level of care: Telemetry Medical Status is: Inpatient Remains inpatient appropriate because: sepsis    TOTAL TIME TAKING CARE OF THIS PATIENT: 35 minutes.  >50% time spent on counselling and coordination of care  Note: This dictation was prepared with Dragon dictation along with smaller phrase technology. Any transcriptional errors that result from this process are unintentional.  Leita Blanch M.D    [facility]   CC: Primary care physician; Diedra Lame, MD

## 2023-11-01 NOTE — Plan of Care (Signed)
  Problem: Clinical Measurements: Goal: Signs and symptoms of infection will decrease Outcome: Progressing   Problem: Respiratory: Goal: Ability to maintain adequate ventilation will improve Outcome: Progressing   Problem: Clinical Measurements: Goal: Ability to maintain clinical measurements within normal limits will improve Outcome: Progressing Goal: Will remain free from infection Outcome: Progressing   Problem: Coping: Goal: Level of anxiety will decrease Outcome: Progressing   Problem: Safety: Goal: Ability to remain free from injury will improve Outcome: Progressing

## 2023-11-01 NOTE — Consult Note (Signed)
 Kyle Copping, MD [facility]  [address], [address] [place], [place] [address] Phone: [phone] Fax : [phone]  Consultation  Referring Provider:     Dr. Cleatus Primary Care Physician:  Kyle Lame, MD Primary Gastroenterologist:  Dr. Rollin         Reason for Consultation:     Cholangitis  Date of Admission:  10/30/2023 Date of Consultation:  11/01/2023         HPI:   Kyle Holloway. is a 83 y.o. male with a history of Crohn disease, myasthenia gravis esophageal strictures with COPD and BPH who had acute cholecystitis with an ERCP in the past and had a repeat ERCP in August with a stent change.  The patient was suspected of having sepsis on presentation to the hospital with the patient reporting weakness.  The patient's liver enzymes were also elevated.  The patient had a low-grade temperature of 102 with a white cell count of 12.9 with an MRCP showing:  IMPRESSION: 1. New 1.4 x 0.6 cm ovoid rim-enhancing T2-hyperintense fluid collection in the peripheral lateral segment of the left hepatic lobe, with a mildly dilated biliary radical showing mural enhancement leading to this collection. Imaging findings are concerning for small peripheral liver abscess and cholangitis. 2. Status post cholecystectomy with prominent cystic duct remnant and common bile duct stent limiting assessment for choledocholithiasis; mural enhancement of the common bile duct and central biliary radicals. 3. Dilated fluid-filled appendix measuring 1.6 cm without surrounding inflammatory changes, raising concern for mucocele; unchanged from 03/19/23 but new compared to 2022. Recommend referral to surgery for further management. 4. 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 According to consensus criteria, follow-up imaging in 3 years is recommended.  The patient's most recent liver enzymes have shown:  Component     Latest Ref Rng 07/26/2023 10/30/2023 10/31/2023 11/01/2023  AST     15  - 41 U/L 83 (H)  59 (H)  38  40   ALT     0 - 44 U/L 111 (H)  55 (H)  41  39   Alkaline Phosphatase     38 - 126 U/L 210 (H)  340 (H)  247 (H)  270 (H)   Total Bilirubin     0.0 - 1.2 mg/dL 1.8 (H)  1.7 (H)  1.4 (H)  1.2      Past Medical History:  Diagnosis Date   Arthritis    COPD (chronic obstructive pulmonary disease) (HCC)    Crohn's disease (HCC)    Emphysema, unspecified (HCC)    Hypertension    Myasthenia gravis (HCC)     Past Surgical History:  Procedure Laterality Date   BILIARY BRUSHING  07/21/2023   Procedure: BRUSH BIOPSY, BILE DUCT;  Surgeon: Rollin Dover, MD;  Location: [facility] ENDOSCOPY;  Service: Gastroenterology;;   BILIARY BRUSHING  10/04/2023   Procedure: BRUSH BIOPSY, BILE DUCT;  Surgeon: Rollin Dover, MD;  Location: [facility]OSCOPY;  Service: Gastroenterology;;   BILIARY STENT PLACEMENT  07/21/2023   Procedure: INSERTION, STENT, BILE DUCT;  Surgeon: Rollin Dover, MD;  Location: [facility] ENDOSCOPY;  Service: Gastroenterology;;   BILIARY STENT PLACEMENT N/A 10/04/2023   Procedure: INSERTION, STENT, BILE DUCT;  Surgeon: Rollin Dover, MD;  Location: [facility] ENDOSCOPY;  Service: Gastroenterology;  Laterality: N/A;   CHOLECYSTECTOMY N/A 07/25/2023   Procedure: LAPAROSCOPIC CHOLECYSTECTOMY;  Surgeon: Ebbie Cough, MD;  Location: [facility] OR;  Service: General;  Laterality: N/A;  WITH ICG DYE   COLONOSCOPY  WITH PROPOFOL  N/A 03/20/2023   Procedure: COLONOSCOPY WITH PROPOFOL ;  Surgeon: Jinny Carmine, MD;  Location: [facility] ENDOSCOPY;  Service: Endoscopy;  Laterality: N/A;   EMBOLIZATION (CATH LAB) N/A 03/21/2023   Procedure: EMBOLIZATION;  Surgeon: Jama Cordella MATSU, MD;  Location: [facility] INVASIVE CV LAB;  Service: Cardiovascular;  Laterality: N/A;   ERCP N/A 07/21/2023   Procedure: ERCP, WITH INTERVENTION IF INDICATED;  Surgeon: Rollin Dover, MD;  Location: [facility] ENDOSCOPY;  Service: Gastroenterology;  Laterality: N/A;   ERCP N/A 10/04/2023   Procedure: ERCP, WITH INTERVENTION IF INDICATED;   Surgeon: Rollin Dover, MD;  Location: [facility] ENDOSCOPY;  Service: Gastroenterology;  Laterality: N/A;   ERCP N/A 11/01/2023   Procedure: ERCP, WITH INTERVENTION IF INDICATED;  Surgeon: Jinny Carmine, MD;  Location: [facility] ENDOSCOPY;  Service: Endoscopy;  Laterality: N/A;   ESOPHAGOGASTRODUODENOSCOPY N/A 03/19/2023   Procedure: ESOPHAGOGASTRODUODENOSCOPY (EGD);  Surgeon: Jinny Carmine, MD;  Location: [facility] ENDOSCOPY;  Service: Endoscopy;  Laterality: N/A;   ESOPHAGOGASTRODUODENOSCOPY N/A 10/04/2023   Procedure: EGD (ESOPHAGOGASTRODUODENOSCOPY);  Surgeon: Rollin Dover, MD;  Location: [facility];  Service: Gastroenterology;  Laterality: N/A;   EUS N/A 10/04/2023   Procedure: ULTRASOUND, UPPER GI TRACT, ENDOSCOPIC;  Surgeon: Rollin Dover, MD;  Location: [facility] ENDOSCOPY;  Service: Gastroenterology;  Laterality: N/A;   JOINT REPLACEMENT Left    total knee replacement    Dr. Mardee   SPHINCTEROTOMY  07/21/2023   Procedure: ANNETT, GI;  Surgeon: Rollin Dover, MD;  Location: [facility] ENDOSCOPY;  Service: Gastroenterology;;   CLEDA REMOVAL  10/04/2023   Procedure: STENT REMOVAL;  Surgeon: Rollin Dover, MD;  Location: [facility]OSCOPY;  Service: Gastroenterology;;   CLEDA REMOVAL  11/01/2023   Procedure: STENT REMOVAL;  Surgeon: Jinny Carmine, MD;  Location: [facility] ENDOSCOPY;  Service: Endoscopy;;   TONSILLECTOMY     TOTAL KNEE ARTHROPLASTY Right 11/20/2021   Procedure: RIGHT TOTAL KNEE ARTHROPLASTY;  Surgeon: Liam Lerner, MD;  Location: [facility] ORS;  Service: Orthopedics;  Laterality: Right;   WISDOM TOOTH EXTRACTION      Prior to Admission medications   Medication Sig Start Date End Date Taking? Authorizing Provider  acetaminophen  (TYLENOL ) 325 MG tablet Take 650 mg by mouth 3 (three) times daily as needed (pain).   Yes [provider]  albuterol  (PROVENTIL  HFA;VENTOLIN  HFA) 108 (90 Base) MCG/ACT inhaler Inhale 2 puffs into the lungs every 6 (six) hours as needed for wheezing or shortness of breath. 12/29/17   Yes Salary, Nemiah BIRCH, MD  aspirin  EC 81 MG tablet Take 81 mg by mouth daily.    Yes [provider]  atenolol  (TENORMIN ) 25 MG tablet Take 1 tablet by mouth daily. 04/19/22 10/31/23 Yes [provider]  balsalazide (COLAZAL ) 750 MG capsule Take 750 mg by mouth 3 (three) times daily. 09/24/17  Yes [provider]  Biotin 1 MG CAPS Take 1 mg by mouth daily.   Yes [provider]  Cholecalciferol (VITAMIN D3) 50 MCG (2000 UT) TABS Take 2,000 Units by mouth daily.   Yes [provider]  cyanocobalamin  (VITAMIN B12) 1000 MCG tablet Take 1,000 mcg by mouth daily.   Yes [provider]  Ferrous Sulfate (IRON  PO) Take 1 tablet by mouth daily.   Yes [provider]  finasteride  (PROSCAR ) 5 MG tablet Take 5 mg by mouth daily. 12/12/17  Yes [provider]  folic acid  (FOLVITE ) 1 MG tablet Take 1 mg by mouth daily. 07/06/14  Yes [provider]  hydrALAZINE  (APRESOLINE ) 50 MG tablet Take 1 tablet (50 mg  total) by mouth every 12 (twelve) hours. 07/26/23  Yes Donnamarie Lebron PARAS, MD  methotrexate  (RHEUMATREX) 2.5 MG tablet Take 2 tablets (5 mg total) by mouth once a week. Every Wednesday  Caution:Chemotherapy. Protect from light. 12/29/17  Yes Salary, Nemiah BIRCH, MD  Multiple Vitamins-Minerals (MULTIVITAMIN WITH MINERALS) tablet Take 1 tablet by mouth 2 (two) times daily with breakfast and lunch.   Yes [provider]  Multiple Vitamins-Minerals (PRESERVISION AREDS PO) Take 2 tablets by mouth daily.   Yes [provider]  Omega-3 Fatty Acids (FISH OIL) 1000 MG CAPS Take 1,000 mg by mouth daily.   Yes [provider]  omeprazole  (PRILOSEC) 40 MG capsule Take 1 capsule (40 mg total) by mouth 2 (two) times daily before a meal. 03/22/23 10/31/23 Yes Maree Hue, MD  pyridostigmine  (MESTINON ) 60 MG tablet Take 1 tablet (60 mg total) by mouth in the morning and at bedtime. See Rx fill hx and office visit note 01/18/23  07/26/23  Yes Donnamarie Lebron PARAS, MD  tamsulosin  (FLOMAX ) 0.4 MG CAPS capsule Take 0.4 mg by mouth daily. 12/12/17  Yes [provider]    Family History  Problem Relation Age of Onset   Brain cancer Father      Social History   Tobacco Use   Smoking status: Former    Current packs/day: 1.50    Types: Cigarettes   Smokeless tobacco: Never  Vaping Use   Vaping status: Never Used  Substance Use Topics   Alcohol use: Not Currently   Drug use: Never    Allergies as of 10/30/2023   (No Known Allergies)    Review of Systems:    All systems reviewed and negative except where noted in HPI.   Physical Exam:  Vital signs in last 24 hours: Temp:  [96.9 F (36.1 C)-98.2 F (36.8 C)] 96.9 F (36.1 C) (09/26 1321) Pulse Rate:  [68-96] 82 (09/26 1326) Resp:  [15-33] 33 (09/26 1343) BP: (129-163)/(81-98) 144/86 (09/26 1343) SpO2:  [95 %-99 %] 96 % (09/26 1343) Last BM Date :  (Before admit) General:   Pleasant, cooperative in NAD Head:  Normocephalic and atraumatic. Eyes:   No icterus.   Conjunctiva pink. PERRLA. Ears:  Normal auditory acuity. Neck:  Supple; no masses or thyroidomegaly Lungs: Respirations even and unlabored. Lungs clear to auscultation bilaterally.   No wheezes, crackles, or rhonchi.  Heart:  Regular rate and rhythm;  Without murmur, clicks, rubs or gallops Abdomen:  Soft, nondistended, nontender. Normal bowel sounds. No appreciable masses or hepatomegaly.  No rebound or guarding.  Rectal:  Not performed. Msk:  Symmetrical without gross deformities.   Extremities:  Without edema, cyanosis or clubbing. Neurologic:  Alert and oriented x3;  grossly normal neurologically. Skin:  Intact without significant lesions or rashes. Cervical Nodes:  No significant cervical adenopathy. Psych:  Alert and cooperative. Normal affect.  LAB RESULTS: Recent Labs    10/30/23 1528 10/31/23 0545 11/01/23 0542  WBC 12.9* 8.2 7.1  HGB 11.5* 10.1* 10.8*  HCT 34.2*  30.2* 32.1*  PLT 381 341 394   BMET Recent Labs    10/30/23 1528 10/31/23 0545 11/01/23 0542  NA 125* 134* 135  K 4.0 3.7 3.7  CL 95* 103 104  CO2 18* 23 21*  GLUCOSE 115* 83 84  BUN 15 14 15   CREATININE 0.77 0.89 1.01  CALCIUM 8.2* 7.9* 8.1*   LFT Recent Labs    11/01/23 0542  PROT 6.7  ALBUMIN 2.5*  AST 40  ALT 39  ALKPHOS 270*  BILITOT 1.2   PT/INR No results for input(s): LABPROT, INR in the last 72 hours.  STUDIES: DG C-Arm 1-60 Min-No Report Result Date: 11/01/2023 Fluoroscopy was utilized by the requesting physician.  No radiographic interpretation.   MR ABDOMEN MRCP W WO CONTAST Result Date: 10/31/2023 EXAM: MRCP WITH AND WITHOUT IV CONTRAST 10/30/2023 09:17:00 PM TECHNIQUE: Multisequence, multiplanar magnetic resonance images of the abdomen with and without intravenous contrast. MRCP sequences were performed. COMPARISON: 07/20/2023 CLINICAL HISTORY: RUQ abdominal pain, biliary disease suspected, no prior imaging; recent ERCP, cholecystectomy, elevated LFTs, evaluation for retained stone, abdominal pain. Per RN: Patient to ED via POV for generalized weakness and chills. Patient states symptoms started this AM. Denies pain. Patient reports having his gallbladder removed approximately 1 month ago and needs stents placement. FINDINGS: LIVER: There is heterogeneous arterial phase enhancement within the liver. Within the periphery of the lateral segment of the left hepatic lobe there is an ovoid, rim enhancing, T2 hyperintense structure measuring 1.4 x 0.6 cm. This is new when compared with the examination from 07/20/2023. A mildly dilated biliary radical with mural enhancement in the lateral segment of the left lobe leads up to this new fluid collection, image 45 / 26. GALLBLADDER AND BILIARY SYSTEM: Status post cholecystectomy. Prominent cystic duct remnant is identified within the gallbladder fossa. A common bile duct stent is in place, which (along with motion  artifact) limits assessment for underlying choledocholithiasis. There does appear to be some mural enhancement of the common bile duct as well as the central biliary radicals. Persistent mild intrahepatic bile duct dilatation. SPLEEN: Unremarkable. PANCREAS/PANCREATIC DUCT: Visualized pancreas is unremarkable. No pancreatic ductal dilatation. ADRENAL GLANDS: Unremarkable. KIDNEYS: Multiple simple-appearing bilateral kidney cysts are noted measuring up to 2.2 cm. No follow-up imaging recommended LYMPH NODES: No upper abdominal adenopathy. 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 This measures 3.4 cm in maximum diameter. PERITONEUM: No ascites. ABDOMINAL WALL: No hernia. No mass. BOWEL: Dilated fluid-filled appendix which measures 1.6 cm, image 15 / 3 without surrounding inflammatory changes. This is similar in thickness when compared with 03/19/2023 but when compared with more remote studies from 2012 this is a new finding. Findings raise the question of underlying mucocele. Colonic diverticulosis without signs of acute diverticulitis. No bowel obstruction. BONES: No acute abnormality or worrisome osseous lesion. SOFT TISSUES: Unremarkable. MISCELLANEOUS: Unremarkable. IMPRESSION: 1. New 1.4 x 0.6 cm ovoid rim-enhancing T2-hyperintense fluid collection in the peripheral lateral segment of the left hepatic lobe, with a mildly dilated biliary radical showing mural enhancement leading to this collection. Imaging findings are concerning for small peripheral liver abscess and cholangitis. 2. Status post cholecystectomy with prominent cystic duct remnant and common bile duct stent limiting assessment for choledocholithiasis; mural enhancement of the common bile duct and central biliary radicals. 3. Dilated fluid-filled appendix measuring 1.6 cm without surrounding inflammatory changes, raising concern for mucocele; unchanged from 03/19/23 but new compared to 2022. Recommend  referral to surgery for further management. 4. 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 According to consensus criteria, follow-up imaging in 3 years is recommended. Electronically signed by: Kyle Calk MD 10/31/2023 05:03 AM EDT RP Workstation: [id number]   MR 3D Recon At Scanner Result Date: 10/31/2023 EXAM: MRCP WITH AND WITHOUT IV CONTRAST 10/30/2023 09:17:00 PM TECHNIQUE: Multisequence, multiplanar magnetic resonance images of the abdomen with and without intravenous contrast. MRCP sequences were performed. COMPARISON: 07/20/2023 CLINICAL HISTORY: RUQ abdominal pain, biliary disease suspected, no prior imaging;  recent ERCP, cholecystectomy, elevated LFTs, evaluation for retained stone, abdominal pain. Per RN: Patient to ED via POV for generalized weakness and chills. Patient states symptoms started this AM. Denies pain. Patient reports having his gallbladder removed approximately 1 month ago and needs stents placement. FINDINGS: LIVER: There is heterogeneous arterial phase enhancement within the liver. Within the periphery of the lateral segment of the left hepatic lobe there is an ovoid, rim enhancing, T2 hyperintense structure measuring 1.4 x 0.6 cm. This is new when compared with the examination from 07/20/2023. A mildly dilated biliary radical with mural enhancement in the lateral segment of the left lobe leads up to this new fluid collection, image 45 / 26. GALLBLADDER AND BILIARY SYSTEM: Status post cholecystectomy. Prominent cystic duct remnant is identified within the gallbladder fossa. A common bile duct stent is in place, which (along with motion artifact) limits assessment for underlying choledocholithiasis. There does appear to be some mural enhancement of the common bile duct as well as the central biliary radicals. Persistent mild intrahepatic bile duct dilatation. SPLEEN: Unremarkable. PANCREAS/PANCREATIC DUCT: Visualized pancreas is unremarkable. No pancreatic  ductal dilatation. ADRENAL GLANDS: Unremarkable. KIDNEYS: Multiple simple-appearing bilateral kidney cysts are noted measuring up to 2.2 cm. No follow-up imaging recommended LYMPH NODES: No upper abdominal adenopathy. 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 This measures 3.4 cm in maximum diameter. PERITONEUM: No ascites. ABDOMINAL WALL: No hernia. No mass. BOWEL: Dilated fluid-filled appendix which measures 1.6 cm, image 15 / 3 without surrounding inflammatory changes. This is similar in thickness when compared with 03/19/2023 but when compared with more remote studies from 2012 this is a new finding. Findings raise the question of underlying mucocele. Colonic diverticulosis without signs of acute diverticulitis. No bowel obstruction. BONES: No acute abnormality or worrisome osseous lesion. SOFT TISSUES: Unremarkable. MISCELLANEOUS: Unremarkable. IMPRESSION: 1. New 1.4 x 0.6 cm ovoid rim-enhancing T2-hyperintense fluid collection in the peripheral lateral segment of the left hepatic lobe, with a mildly dilated biliary radical showing mural enhancement leading to this collection. Imaging findings are concerning for small peripheral liver abscess and cholangitis. 2. Status post cholecystectomy with prominent cystic duct remnant and common bile duct stent limiting assessment for choledocholithiasis; mural enhancement of the common bile duct and central biliary radicals. 3. Dilated fluid-filled appendix measuring 1.6 cm without surrounding inflammatory changes, raising concern for mucocele; unchanged from 03/19/23 but new compared to 2022. Recommend referral to surgery for further management. 4. 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 According to consensus criteria, follow-up imaging in 3 years is recommended. Electronically signed by: Kyle Calk MD 10/31/2023 05:03 AM EDT RP Workstation: [id number]   DG Chest 2 View Result Date:  10/30/2023 CLINICAL DATA:  dyspnea EXAM: CHEST - 2 VIEW COMPARISON:  July 20, 2023 FINDINGS: No focal airspace consolidation, pleural effusion, or pneumothorax. No cardiomegaly. Hiatal hernia. Tortuous aorta with aortic atherosclerosis. No acute fracture or destructive lesions. Multilevel thoracic osteophytosis. IMPRESSION: No acute cardiopulmonary abnormality. Electronically Signed   By: Kyle Holloway M.D.   On: 10/30/2023 19:20      Impression / Plan:   Assessment: Principal Problem:   Sepsis (HCC) Active Problems:   Stricture and stenosis of esophagus   Crohn's disease (HCC)   Benign essential hypertension   Benign enlargement of prostate   Chronic obstructive pulmonary disease (HCC)   Biliary tract infection, suspected   S/P laparoscopic cholecystectomy 07/2023 prior ERCP with biliary stent for acalculous cholecystitis   Myasthenia gravis (HCC)   Hard  of hearing   Sepsis, unspecified organism (HCC)   Acute metabolic acidosis   Kyle STREY Sr. is a 83 y.o. y/o male with a history of Crohn disease and has had stents placed in his bile duct due to strictures.  The patient now comes in with weakness and a CT scan described above.  The patient has felt better since being put on antibiotics during his hospital stay  Plan:  I would recommend the patient being evaluated by infectious disease and that consult has been called by the hospitalist.  I would also recommend a repeat ERCP for stent removal, repeat brushings of the common bile duct and a replacement of the stents due to what appears to be cholangitis.  The patient has a history of Crohn disease and PSC should be considered.  The patient and his wife have been explained the plan and agree with proceeding with the ERCP.  Thank you for involving me in the care of this patient.      LOS: 1 day   Kyle Copping, MD, MD. [facility] 11/01/2023, 5:19 PM,  Pager [phone] 7am-5pm  Check AMION for 5pm -7am coverage and on weekends    Note: This dictation was prepared with Dragon dictation along with smaller phrase technology. Any transcriptional errors that result from this process are unintentional.

## 2023-11-01 NOTE — Consult Note (Addendum)
 NAME: Kyle MAYOL Sr.  DOB: [birth year]  MRN: [id number]  Date/Time: 11/01/2023 10:35 AM  REQUESTING PROVIDER: Dr.Shah Subjective:  REASON FOR CONSULT: Cholangitis/hepatic abscess ? Kyle ONEIDA Dec Sr. is a 83 y.o. with a history of Crohn's disease, myasthenia gravis, esophageal stricture, COPD, BPH, Presents to the ED via private vehicle with generalized weakness and chills. Patient was hospitalized 07/20/2023 until 07/26/2023 for abdominal pain fever and nausea and had elevated LFTs hyperbilirubinemia right upper quadrant ultrasound showed gallstones.  MRCP showed cholelithiasis with acute cholecystitis and diffuse biliary duct dilatation with abrupt termination of the mid common bile duct.   Transfer from [facility] to [facility] for ERCP.  He underwent ERCP on 07/21/2023 with biliary sphincterotomy and plastic stent to CBD.  The biliary tree was swept and pus was found.  He underwent lap cholecystectomy on 07/25/2023 after getting IV antibiotics in the hospital he was sent home on p.o. Augmentin .  Then on 10/04/2023 he underwent EUS and ERCP with stent exchange as outpatient. He presented to the [facility] ED on 10/30/2023 with weakness.  10/30/23 vitals  BP: 121/73 Temp: 99.5 F (37.5 C) Pulse Rate: 102 ! Resp: 17 SpO2: 92 %  labs in ED WBC: 12.9 (H) Hemoglobin: 11.5 (L) HCT: 34.2 (L) Platelets: 381 Creatinine: 0.77 Alkaline phosphatase 340 compared to 210 --3 months ago AST 59 ALT 55  An MRCP showed persistent mild intrahepatic bile duct dilatation Common bile duct stent in place.  There was questionable mural enhancement of the common bile duct as well as central biliary radicles. The lateral segment of the left hepatic lobe there was an ovoid rim-enhancing structure of 1.4 into 0.6 cm.  Suggestive of an abscess Multiple simple appearing bilateral kidney cysts. 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  Blood cultures has been sent and he has been started on IV cefepime  and  metronidazole  I am asked to see him for ascending cholangitis and hepatic abscess Patient is in the ERCP unit waiting to have the procedure  Past Medical History:  Diagnosis Date   Arthritis    COPD (chronic obstructive pulmonary disease) (HCC)    Crohn's disease (HCC)    Emphysema, unspecified (HCC)    Hypertension    Myasthenia gravis (HCC)     Past Surgical History:  Procedure Laterality Date   BILIARY BRUSHING  07/21/2023   Procedure: BRUSH BIOPSY, BILE DUCT;  Surgeon: Rollin Dover, MD;  Location: [facility] ENDOSCOPY;  Service: Gastroenterology;;   BILIARY BRUSHING  10/04/2023   Procedure: BRUSH BIOPSY, BILE DUCT;  Surgeon: Rollin Dover, MD;  Location: [facility] ENDOSCOPY;  Service: Gastroenterology;;   BILIARY STENT PLACEMENT  07/21/2023   Procedure: INSERTION, STENT, BILE DUCT;  Surgeon: Rollin Dover, MD;  Location: [facility] ENDOSCOPY;  Service: Gastroenterology;;   BILIARY STENT PLACEMENT N/A 10/04/2023   Procedure: INSERTION, STENT, BILE DUCT;  Surgeon: Rollin Dover, MD;  Location: [facility] ENDOSCOPY;  Service: Gastroenterology;  Laterality: N/A;   CHOLECYSTECTOMY N/A 07/25/2023   Procedure: LAPAROSCOPIC CHOLECYSTECTOMY;  Surgeon: Ebbie Cough, MD;  Location: [facility] OR;  Service: General;  Laterality: N/A;  WITH ICG DYE   COLONOSCOPY WITH PROPOFOL  N/A 03/20/2023   Procedure: COLONOSCOPY WITH PROPOFOL ;  Surgeon: Jinny Carmine, MD;  Location: [facility] ENDOSCOPY;  Service: Endoscopy;  Laterality: N/A;   EMBOLIZATION (CATH LAB) N/A 03/21/2023   Procedure: EMBOLIZATION;  Surgeon: Jama Cordella MATSU, MD;  Location: [facility] INVASIVE CV LAB;  Service: Cardiovascular;  Laterality: N/A;   ERCP N/A 07/21/2023   Procedure: ERCP, WITH INTERVENTION IF INDICATED;  Surgeon: Rollin Dover, MD;  Location: [facility] ENDOSCOPY;  Service: Gastroenterology;  Laterality: N/A;   ERCP N/A 10/04/2023   Procedure: ERCP, WITH INTERVENTION IF INDICATED;  Surgeon: Rollin Dover, MD;  Location: [facility] ENDOSCOPY;  Service: Gastroenterology;  Laterality:  N/A;   ESOPHAGOGASTRODUODENOSCOPY N/A 03/19/2023   Procedure: ESOPHAGOGASTRODUODENOSCOPY (EGD);  Surgeon: Jinny Carmine, MD;  Location: [facility] ENDOSCOPY;  Service: Endoscopy;  Laterality: N/A;   ESOPHAGOGASTRODUODENOSCOPY N/A 10/04/2023   Procedure: EGD (ESOPHAGOGASTRODUODENOSCOPY);  Surgeon: Rollin Dover, MD;  Location: [facility] ENDOSCOPY;  Service: Gastroenterology;  Laterality: N/A;   EUS N/A 10/04/2023   Procedure: ULTRASOUND, UPPER GI TRACT, ENDOSCOPIC;  Surgeon: Rollin Dover, MD;  Location: [facility] ENDOSCOPY;  Service: Gastroenterology;  Laterality: N/A;   JOINT REPLACEMENT Left    total knee replacement    Dr. Mardee   SPHINCTEROTOMY  07/21/2023   Procedure: ANNETT, GI;  Surgeon: Rollin Dover, MD;  Location: [facility] ENDOSCOPY;  Service: Gastroenterology;;   CLEDA REMOVAL  10/04/2023   Procedure: STENT REMOVAL;  Surgeon: Rollin Dover, MD;  Location: [facility] ENDOSCOPY;  Service: Gastroenterology;;   TONSILLECTOMY     TOTAL KNEE ARTHROPLASTY Right 11/20/2021   Procedure: RIGHT TOTAL KNEE ARTHROPLASTY;  Surgeon: Liam Lerner, MD;  Location: [facility] ORS;  Service: Orthopedics;  Laterality: Right;   WISDOM TOOTH EXTRACTION      Social History   Socioeconomic History   Marital status: Married    Spouse name: Not on file   Number of children: Not on file   Years of education: Not on file   Highest education level: Not on file  Occupational History   Not on file  Tobacco Use   Smoking status: Former    Current packs/day: 1.50    Types: Cigarettes   Smokeless tobacco: Never  Vaping Use   Vaping status: Never Used  Substance and Sexual Activity   Alcohol use: Not Currently   Drug use: Never   Sexual activity: Not Currently  Other Topics Concern   Not on file  Social History Narrative   Not on file   Social Drivers of Health   Financial Resource Strain: Patient Declined (02/07/2023)   Received from [facility]   Overall Financial Resource Strain (CARDIA)    Difficulty of Paying  Living Expenses: Patient declined  Food Insecurity: No Food Insecurity (10/30/2023)   Hunger Vital Sign    Worried About Running Out of Food in the Last Year: Never true    Ran Out of Food in the Last Year: Never true  Transportation Needs: No Transportation Needs (10/30/2023)   PRAPARE - Administrator, Civil Service (Medical): No    Lack of Transportation (Non-Medical): No  Physical Activity: Not on file  Stress: Not on file  Social Connections: Socially Integrated (10/30/2023)   Social Connection and Isolation Panel    Frequency of Communication with Friends and Family: Twice a week    Frequency of Social Gatherings with Friends and Family: More than three times a week    Attends Religious Services: More than 4 times per year    Active Member of [employer] or Organizations: Yes    Attends Banker Meetings: More than 4 times per year    Marital Status: Married  Catering manager Violence: Not At Risk (10/30/2023)   Humiliation, Afraid, Rape, and Kick questionnaire    Fear of Current or Ex-Partner: No    Emotionally Abused: No    Physically Abused: No    Sexually Abused: No  Family History  Problem Relation Age of Onset   Brain cancer Father    No Known Allergies I? Current Facility-Administered Medications  Medication Dose Route Frequency Provider Last Rate Last Admin   0.9 %  sodium chloride  infusion   Intravenous Continuous Jinny Carmine, MD 15 mL/hr at 11/01/23 9391 Infusion Verify at 11/01/23 9391   acetaminophen  (TYLENOL ) tablet 650 mg  650 mg Oral Q6H PRN Duncan, Hazel V, MD       Or   acetaminophen  (TYLENOL ) suppository 650 mg  650 mg Rectal Q6H PRN Cleatus Delayne GAILS, MD       albuterol  (PROVENTIL ) (2.5 MG/3ML) 0.083% nebulizer solution 2.5 mg  2.5 mg Inhalation Q6H PRN Duncan, Hazel V, MD       aspirin  EC tablet 81 mg  81 mg Oral Daily Duncan, Hazel V, MD   81 mg at 10/31/23 0802   ceFEPIme  (MAXIPIME ) 2 g in sodium chloride  0.9 % 100 mL IVPB  2 g  Intravenous Q8H Duncan, Hazel V, MD   Stopped at 11/01/23 0446   finasteride  (PROSCAR ) tablet 5 mg  5 mg Oral Daily Duncan, Hazel V, MD   5 mg at 10/31/23 9197   HYDROcodone -acetaminophen  (NORCO/VICODIN) 5-325 MG per tablet 1-2 tablet  1-2 tablet Oral Q4H PRN Duncan, Hazel V, MD       [START ON 11/02/2023] Influenza vac split trivalent PF (FLUZONE HIGH-DOSE) injection 0.5 mL  0.5 mL Intramuscular Tomorrow-1000 Maree Hue, MD       ketorolac  (TORADOL ) 15 MG/ML injection 15 mg  15 mg Intravenous Q6H PRN Duncan, Hazel V, MD       metroNIDAZOLE  (FLAGYL ) IVPB 500 mg  500 mg Intravenous Q12H Duncan, Hazel V, MD 100 mL/hr at 11/01/23 0805 500 mg at 11/01/23 0805   morphine  (PF) 2 MG/ML injection 2 mg  2 mg Intravenous Q2H PRN Duncan, Hazel V, MD       ondansetron  (ZOFRAN ) tablet 4 mg  4 mg Oral Q6H PRN Duncan, Hazel V, MD       Or   ondansetron  (ZOFRAN ) injection 4 mg  4 mg Intravenous Q6H PRN Duncan, Hazel V, MD       pantoprazole  (PROTONIX ) EC tablet 40 mg  40 mg Oral Daily Duncan, Hazel V, MD   40 mg at 10/31/23 0801   pyridostigmine  (MESTINON ) tablet 60 mg  60 mg Oral BID Duncan, Hazel V, MD   60 mg at 10/31/23 2214   tamsulosin  (FLOMAX ) capsule 0.4 mg  0.4 mg Oral Daily Duncan, Hazel V, MD   0.4 mg at 10/31/23 0801   Facility-Administered Medications Ordered in Other Encounters  Medication Dose Route Frequency Provider Last Rate Last Admin   albuterol  (PROVENTIL ) (2.5 MG/3ML) 0.083% nebulizer solution 2.5 mg  2.5 mg Nebulization Once Verdia Art, MD         Abtx:  Anti-infectives (From admission, onward)    Start     Dose/Rate Route Frequency Ordered Stop   10/31/23 0400  ceFEPIme  (MAXIPIME ) 2 g in sodium chloride  0.9 % 100 mL IVPB        2 g 200 mL/hr over 30 Minutes Intravenous Every 8 hours 10/30/23 2007 11/07/23 0359   10/30/23 2000  metroNIDAZOLE  (FLAGYL ) IVPB 500 mg        500 mg 100 mL/hr over 60 Minutes Intravenous Every 12 hours 10/30/23 1955 11/06/23 1959   10/30/23  1945  ceFEPIme  (MAXIPIME ) 2 g in sodium chloride  0.9 % 100 mL IVPB  2 g 200 mL/hr over 30 Minutes Intravenous  Once 10/30/23 1930 10/30/23 2204   10/30/23 1945  metroNIDAZOLE  (FLAGYL ) IVPB 500 mg  Status:  Discontinued        500 mg 100 mL/hr over 60 Minutes Intravenous  Once 10/30/23 1930 10/30/23 2005       REVIEW OF SYSTEMS:  Const: negative fever, ++chills, negative weight loss Eyes: negative diplopia or visual changes, negative eye pain ENT: negative coryza, negative sore throat Resp: negative cough, hemoptysis, dyspnea Cards: negative for chest pain, palpitations, lower extremity edema GU: negative for frequency, dysuria and hematuria GI: no abdominal pain, no  diarrhea, bleeding, constipation Skin: negative for rash and pruritus Heme: negative for easy bruising and gum/nose bleeding MS:  weakness Neurolo:negative for headaches, dizziness, vertigo, memory problems  Psych: negative for feelings of anxiety, depression  Endocrine: negative for thyroid , diabetes Allergy/Immunology- negative for any medication or food allergies ? Pertinent Positives include : Objective:  VITALS:  BP (!) 163/98 (BP Location: Left Arm)   Pulse 69   Temp 97.9 F (36.6 C)   Resp 19   Ht 5' 10 (1.778 m)   Wt 88.5 kg   SpO2 95%   BMI 27.98 kg/m  LDA Foley Central line Other drainage tubes PHYSICAL EXAM:  General: Alert, cooperative, no distress, appears stated age.  Head: Normocephalic, without obvious abnormality, atraumatic. Eyes: Conjunctivae clear, anicteric sclerae. Pupils are equal ENT Nares normal. No drainage or sinus tenderness. Lips, mucosa, and tongue normal. No Thrush Neck: Supple, symmetrical, no adenopathy, thyroid : non tender no carotid bruit and no JVD. Back: No CVA tenderness. Lungs: Clear to auscultation bilaterally. No Wheezing or Rhonchi. No rales. Heart: Regular rate and rhythm, no murmur, rub or gallop. Abdomen: Soft, non-tender,not distended. Bowel sounds  normal. No masses Extremities: atraumatic, no cyanosis. No edema. No clubbing Skin: No rashes or lesions. Or bruising Lymph: Cervical, supraclavicular normal. Neurologic: Grossly non-focal Pertinent Labs Lab Results CBC    Component Value Date/Time   WBC 7.1 11/01/2023 0542   RBC 3.44 (L) 11/01/2023 0542   HGB 10.8 (L) 11/01/2023 0542   HGB 12.9 (L) 08/02/2023 1251   HGB 12.4 (L) 06/18/2013 0438   HCT 32.1 (L) 11/01/2023 0542   HCT 37.5 (L) 06/17/2013 0548   PLT 394 11/01/2023 0542   PLT 467 (H) 08/02/2023 1251   PLT 272 06/17/2013 0548   MCV 93.3 11/01/2023 0542   MCV 98 06/17/2013 0548   MCH 31.4 11/01/2023 0542   MCHC 33.6 11/01/2023 0542   RDW 15.6 (H) 11/01/2023 0542   RDW 14.8 (H) 06/17/2013 0548   LYMPHSABS 1.1 08/02/2023 1251   LYMPHSABS 0.7 (L) 06/17/2013 0548   MONOABS 0.9 08/02/2023 1251   MONOABS 0.2 06/17/2013 0548   EOSABS 0.2 08/02/2023 1251   EOSABS 0.0 06/17/2013 0548   BASOSABS 0.1 08/02/2023 1251   BASOSABS 0.0 06/17/2013 0548       Latest Ref Rng & Units 11/01/2023    5:42 AM 10/31/2023    5:45 AM 10/30/2023    3:28 PM  CMP  Glucose 70 - 99 mg/dL 84  83  884   BUN 8 - 23 mg/dL 15  14  15    Creatinine 0.61 - 1.24 mg/dL 8.98  9.10  9.22   Sodium 135 - 145 mmol/L 135  134  125   Potassium 3.5 - 5.1 mmol/L 3.7  3.7  4.0   Chloride 98 - 111 mmol/L 104  103  95   CO2 22 -  32 mmol/L 21  23  18    Calcium 8.9 - 10.3 mg/dL 8.1  7.9  8.2   Total Protein 6.5 - 8.1 g/dL 6.7  6.2  7.3   Total Bilirubin 0.0 - 1.2 mg/dL 1.2  1.4  1.7   Alkaline Phos 38 - 126 U/L 270  247  340   AST 15 - 41 U/L 40  38  59   ALT 0 - 44 U/L 39  41  55       Microbiology: Recent Results (from the past 240 hours)  Resp panel by RT-PCR (RSV, Flu A&B, Covid) Anterior Nasal Swab     Status: None   Collection Time: 10/30/23  3:29 PM   Specimen: Anterior Nasal Swab  Result Value Ref Range Status   SARS Coronavirus 2 by RT PCR NEGATIVE NEGATIVE Final    Comment:  (NOTE) SARS-CoV-2 target nucleic acids are NOT DETECTED.  The SARS-CoV-2 RNA is generally detectable in upper respiratory specimens during the acute phase of infection. The lowest concentration of SARS-CoV-2 viral copies this assay can detect is 138 copies/mL. A negative result does not preclude SARS-Cov-2 infection and should not be used as the sole basis for treatment or other patient management decisions. A negative result may occur with  improper specimen collection/handling, submission of specimen other than nasopharyngeal swab, presence of viral mutation(s) within the areas targeted by this assay, and inadequate number of viral copies(<138 copies/mL). A negative result must be combined with clinical observations, patient history, and epidemiological information. The expected result is Negative.  Fact Sheet for Patients:  BloggerCourse.com  Fact Sheet for Healthcare Providers:  SeriousBroker.it  This test is no t yet approved or cleared by the [place]  FDA and  has been authorized for detection and/or diagnosis of SARS-CoV-2 by FDA under an Emergency Use Authorization (EUA). This EUA will remain  in effect (meaning this test can be used) for the duration of the COVID-19 declaration under Section 564(b)(1) of the Act, 21 U.S.C.section 360bbb-3(b)(1), unless the authorization is terminated  or revoked sooner.       Influenza A by PCR NEGATIVE NEGATIVE Final   Influenza B by PCR NEGATIVE NEGATIVE Final    Comment: (NOTE) The Xpert Xpress SARS-CoV-2/FLU/RSV plus assay is intended as an aid in the diagnosis of influenza from Nasopharyngeal swab specimens and should not be used as a sole basis for treatment. Nasal washings and aspirates are unacceptable for Xpert Xpress SARS-CoV-2/FLU/RSV testing.  Fact Sheet for Patients: BloggerCourse.com  Fact Sheet for Healthcare  Providers: SeriousBroker.it  This test is not yet approved or cleared by the [place]  FDA and has been authorized for detection and/or diagnosis of SARS-CoV-2 by FDA under an Emergency Use Authorization (EUA). This EUA will remain in effect (meaning this test can be used) for the duration of the COVID-19 declaration under Section 564(b)(1) of the Act, 21 U.S.C. section 360bbb-3(b)(1), unless the authorization is terminated or revoked.     Resp Syncytial Virus by PCR NEGATIVE NEGATIVE Final    Comment: (NOTE) Fact Sheet for Patients: BloggerCourse.com  Fact Sheet for Healthcare Providers: SeriousBroker.it  This test is not yet approved or cleared by the [place]  FDA and has been authorized for detection and/or diagnosis of SARS-CoV-2 by FDA under an Emergency Use Authorization (EUA). This EUA will remain in effect (meaning this test can be used) for the duration of the COVID-19 declaration under Section 564(b)(1) of the Act, 21 U.S.C. section 360bbb-3(b)(1), unless the authorization is terminated  or revoked.  Performed at [facility], [address] [place], [place] [address]   Blood culture (routine x 2)     Status: None (Preliminary result)   Collection Time: 10/30/23  6:23 PM   Specimen: BLOOD  Result Value Ref Range Status   Specimen Description BLOOD BLOOD RIGHT FOREARM  Final   Special Requests   Final    BOTTLES DRAWN AEROBIC AND ANAEROBIC Blood Culture results may not be optimal due to an inadequate volume of blood received in culture bottles   Culture   Final    NO GROWTH 2 DAYS Performed at [facility], [address], [place], [place] [address]    Report Status PENDING  Incomplete  Blood culture (routine x 2)     Status: None (Preliminary result)   Collection Time: 10/30/23  6:23 PM   Specimen: BLOOD  Result Value Ref Range Status   Specimen Description  BLOOD BLOOD LEFT FOREARM  Final   Special Requests   Final    BOTTLES DRAWN AEROBIC AND ANAEROBIC Blood Culture results may not be optimal due to an inadequate volume of blood received in culture bottles   Culture   Final    NO GROWTH 2 DAYS Performed at [facility], [address], [place], [place] [address]    Report Status PENDING  Incomplete      IMAGING RESULTS:  I have personally reviewed the films Left lobe of the liver small abscess  Impression/Recommendation Patient presenting with weakness and chills Recent history of biliary stricture, ascending cholangitis, cholelithiasis, ERCP stent placement and cholecystectomy Now presenting with weakness MRI shows liver abscess microabscesses in the liver ? Biliary stricture with  obstruction with ascending cholangitis with abscess in the left lobe.  Patient is currently on cefepime  and Flagyl  Will change it to Zosyn   It is very small - can ask IR whether they can aspirate for culture  He is undergoing ERCP   Crohn's disease ion balsalazide - Hypertension management as per primary team  Myasthenia gravis on methotrexate  and pyridostigmine  Hold methotrexate   BPH on finestaride and tamsulosin   COPD This consult involves complicated antimicrobial management  ________________________________________________  Note:  This document was prepared using Dragon voice recognition software and may include unintentional dictation errors.

## 2023-11-01 NOTE — Anesthesia Procedure Notes (Signed)
 Procedure Name: MAC Date/Time: 11/01/2023 11:50 AM  Performed by: Lorrene Camelia LABOR, CRNAPre-anesthesia Checklist: Patient identified, Emergency Drugs available, Suction available and Patient being monitored Patient Re-evaluated:Patient Re-evaluated prior to induction Oxygen Delivery Method: Nasal cannula Preoxygenation: Pre-oxygenation with 100% oxygen Induction Type: IV induction

## 2023-11-01 NOTE — Transfer of Care (Signed)
 Immediate Anesthesia Transfer of Care Note  Patient: Kyle Holloway.  Procedure(s) Performed: ERCP, WITH INTERVENTION IF INDICATED STENT REMOVAL  Patient Location: Endoscopy Unit  Anesthesia Type:General  Level of Consciousness: drowsy and patient cooperative  Airway & Oxygen Therapy: Patient Spontanous Breathing  Post-op Assessment: Report given to RN and Post -op Vital signs reviewed and stable  Post vital signs: Reviewed and stable  Last Vitals:  Vitals Value Taken Time  BP 129/83 11/01/23 13:21  Temp    Pulse 84 11/01/23 13:26  Resp 26 11/01/23 13:26  SpO2 94 % 11/01/23 13:26  Vitals shown include unfiled device data.  Last Pain:  Vitals:   11/01/23 1321  TempSrc: Temporal  PainSc: 0-No pain         Complications: No notable events documented.

## 2023-11-02 ENCOUNTER — Other Ambulatory Visit: Payer: Self-pay

## 2023-11-02 DIAGNOSIS — A419 Sepsis, unspecified organism: Secondary | ICD-10-CM | POA: Diagnosis not present

## 2023-11-02 DIAGNOSIS — K8309 Other cholangitis: Secondary | ICD-10-CM | POA: Diagnosis not present

## 2023-11-02 DIAGNOSIS — G7000 Myasthenia gravis without (acute) exacerbation: Secondary | ICD-10-CM

## 2023-11-02 LAB — CBC
HCT: 37.4 % — ABNORMAL LOW (ref 39.0–52.0)
Hemoglobin: 12.3 g/dL — ABNORMAL LOW (ref 13.0–17.0)
MCH: 31.2 pg (ref 26.0–34.0)
MCHC: 32.9 g/dL (ref 30.0–36.0)
MCV: 94.9 fL (ref 80.0–100.0)
Platelets: 472 K/uL — ABNORMAL HIGH (ref 150–400)
RBC: 3.94 MIL/uL — ABNORMAL LOW (ref 4.22–5.81)
RDW: 15.4 % (ref 11.5–15.5)
WBC: 6.9 K/uL (ref 4.0–10.5)
nRBC: 0 % (ref 0.0–0.2)

## 2023-11-02 LAB — COMPREHENSIVE METABOLIC PANEL WITH GFR
ALT: 41 U/L (ref 0–44)
AST: 58 U/L — ABNORMAL HIGH (ref 15–41)
Albumin: 3 g/dL — ABNORMAL LOW (ref 3.5–5.0)
Alkaline Phosphatase: 315 U/L — ABNORMAL HIGH (ref 38–126)
Anion gap: 10 (ref 5–15)
BUN: 11 mg/dL (ref 8–23)
CO2: 24 mmol/L (ref 22–32)
Calcium: 9 mg/dL (ref 8.9–10.3)
Chloride: 105 mmol/L (ref 98–111)
Creatinine, Ser: 1.08 mg/dL (ref 0.61–1.24)
GFR, Estimated: >60 mL/min (ref >60)
Glucose, Bld: 109 mg/dL — ABNORMAL HIGH (ref 70–99)
Potassium: 4.5 mmol/L (ref 3.5–5.1)
Sodium: 139 mmol/L (ref 135–145)
Total Bilirubin: 1.4 mg/dL — ABNORMAL HIGH (ref 0.0–1.2)
Total Protein: 7.7 g/dL (ref 6.5–8.1)

## 2023-11-02 MED ORDER — AMOXICILLIN-POT CLAVULANATE 875-125 MG PO TABS: 1.0000 | ORAL_TABLET | Freq: Two times a day (BID) | ORAL | Status: DC

## 2023-11-02 MED ORDER — AMOXICILLIN-POT CLAVULANATE 875-125 MG PO TABS
1.0000 | ORAL_TABLET | Freq: Two times a day (BID) | ORAL | 0 refills | Status: AC
  Filled 2023-11-02: qty 56, 28d supply, fill #0

## 2023-11-02 NOTE — Discharge Summary (Signed)
 Physician Discharge Summary   Patient: [facility]. MRN: [id number] DOB: [birth year]  Admit date:     10/30/2023  Discharge date: 11/02/23  Discharge Physician: Leita Blanch   PCP: Diedra Lame, MD   Recommendations at discharge:   follow-up Dr. Searcy in two week 2. patient advised to return to ER if has right upper quadrant pain, fever or nausea vomiting 3. Advised to keep appointment with G.I. on Monday as scheduled 4. Follow-up PCP in 1 to 2 week  Discharge Diagnoses: Principal Problem:   Sepsis (HCC) Active Problems:   Biliary tract infection, suspected   Benign essential hypertension   S/P laparoscopic cholecystectomy 07/2023 prior ERCP with biliary stent for acalculous cholecystitis   Acute metabolic acidosis   Stricture and stenosis of esophagus   Crohn's disease (HCC)   Benign enlargement of prostate   Chronic obstructive pulmonary disease (HCC)   Myasthenia gravis (HCC)   Hard of hearing   Sepsis, unspecified organism [facility])   Acute cholangitis  83 y.o. male with medical history significant for Crohn's disease, myasthenia gravis, esophageal stricture, COPD, BPH and hard of hearing, admitted in June 2025 with acute calculous cholecystitis s/p ERCP with stent, now s/p lap chole 07/25/2023, being admitted with suspected sepsis secondary to biliary source    9/25: MRCP. Concerning for possible stent infection/liver abscess. ID & GI c/s - ERCP tomorrow 9/26 s/p ERCP today mm no pain. Wants to go home   Biliary tract infection, suspected Possible sepsis POA liked due to stent infection/liver abscess seen on MRCP S/p cholecystectomy, prior ERCP with biliary stent for biliary stricture and h/o acalculous cholecystitis 07/2023 --Sepsis criteria include low-grade temp, tachycardia and leukocytosis, soft blood pressure Elevated LFTs in the setting of history of biliary stent June s/p cholecystectomy - continue Empiric antibiotics with Zosyn  and Flagyl  for sepsis of  intra-abdominal source - MRCP concerning for small peripheral liver abscess and cholangitis. - Follow cultures - d/w Dr Jinny --ERCP  - A single segmental biliary stricture was found in                         the middle third of the main bile duct.                        - Two stents were removed from the common bile duct.                        - The biliary tree was swept and sludge was found.                        - Cells for cytology obtained in the middle third of                         the main bile duct.                        - One plastic stent was placed into the common bile                         duct.                        - - These finding are consistant with possible PSC and  that should be worked up by his genreal GI doc (dr Rollin in [place]) - ID c/s - considering possible liver abscess seen on MRCP. --ID suggested I discussed with IR on call to see if the small liver abscess can be drained. Discussed with Dr.Mugweru who reviewed MRI films and collection is too small for needle placement. Recommends follow-up scan in 2 to 3 weeks. -- Per ID change to Augmentin  875 BID for four weeks and follow-up as outpatient in two weeks -- patient's white count remains stable. He has no fever his tolerating PO diet. He is eager to go home. He will follow-up with G.I. on Monday on his scheduled appointment   Benign essential hypertension -- resumed home meds   acute metabolic acidosis --likely secondary to acute infection/sepsis -- improved   Stricture and stenosis of esophagus --No acute issues, no nausea or vomiting   Hard of hearing --Increased communication needs   Myasthenia gravis (HCC) --Continue pyridostigmine  --Avoid meds likely to worsen symptoms --holding Methotrexate  till patient completed po abxs course   Chronic obstructive pulmonary disease (HCC) --Not acutely exacerbated --DuoNebs as needed   Benign enlargement of prostate --Continue  finasteride  and tamsulosin    Crohn's disease (HCC) --Not acutely flared --holding Balsalazide till pt on po abxs--unless ok from GI standpoint to cont     Procedures: ERCP Family communication : wife at bedside Consults : G.I., ID CODE STATUS: full DVT Prophylaxis : SCD      Disposition: Home Diet recommendation:  Discharge Diet Orders (From admission, onward)     Start     Ordered   11/02/23 0000  Diet - low sodium heart healthy        11/02/23 1425            DISCHARGE MEDICATION: Allergies as of 11/02/2023   No Known Allergies      Medication List     PAUSE taking these medications    balsalazide 750 MG capsule Wait to take this until your doctor or other care provider tells you to start again. Until you are done with your oral antibiotics Commonly known as: COLAZAL  Take 750 mg by mouth 3 (three) times daily.   methotrexate  2.5 MG tablet Wait to take this until your doctor or other care provider tells you to start again. Until you finish your Oral antibiotics Commonly known as: RHEUMATREX Take 2 tablets (5 mg total) by mouth once a week. Every Wednesday  Caution:Chemotherapy. Protect from light.       TAKE these medications    acetaminophen  325 MG tablet Commonly known as: TYLENOL  Take 650 mg by mouth 3 (three) times daily as needed (pain).   albuterol  108 (90 Base) MCG/ACT inhaler Commonly known as: VENTOLIN  HFA Inhale 2 puffs into the lungs every 6 (six) hours as needed for wheezing or shortness of breath.   amoxicillin -clavulanate 875-125 MG tablet Commonly known as: AUGMENTIN  Take 1 tablet by mouth every 12 (twelve) hours for 28 days.   aspirin  EC 81 MG tablet Take 81 mg by mouth daily.   atenolol  25 MG tablet Commonly known as: TENORMIN  Take 1 tablet by mouth daily.   Biotin 1 MG Caps Take 1 mg by mouth daily.   cyanocobalamin  1000 MCG tablet Commonly known as: VITAMIN B12 Take 1,000 mcg by mouth daily.   finasteride  5 MG  tablet Commonly known as: PROSCAR  Take 5 mg by mouth daily.   Fish Oil 1000 MG Caps Take 1,000 mg by mouth daily.   folic  acid 1 MG tablet Commonly known as: FOLVITE  Take 1 mg by mouth daily.   hydrALAZINE  50 MG tablet Commonly known as: APRESOLINE  Take 1 tablet (50 mg total) by mouth every 12 (twelve) hours.   IRON  PO Take 1 tablet by mouth daily.   multivitamin with minerals tablet Take 1 tablet by mouth 2 (two) times daily with breakfast and lunch.   PRESERVISION AREDS PO Take 2 tablets by mouth daily.   omeprazole  40 MG capsule Commonly known as: PRILOSEC Take 1 capsule (40 mg total) by mouth 2 (two) times daily before a meal.   pyridostigmine  60 MG tablet Commonly known as: MESTINON  Take 1 tablet (60 mg total) by mouth in the morning and at bedtime. See Rx fill hx and office visit note 01/18/23   tamsulosin  0.4 MG Caps capsule Commonly known as: FLOMAX  Take 0.4 mg by mouth daily.   Vitamin D3 50 MCG (2000 UT) Tabs Take 2,000 Units by mouth daily.        Follow-up Information     Diedra Lame, MD. Schedule an appointment as soon as possible for a visit in 1 week(s).   Specialty: Family Medicine Contact information: [address] [facility] [place] [address] [phone]                Discharge Exam: Fredricka Weights   10/30/23 1525  Weight: 88.5 kg   GENERAL:  83 y.o.-year-old patient with no acute distress.  LUNGS: Normal breath sounds bilaterally, no wheezing CARDIOVASCULAR: S1, S2 normal. No murmur   ABDOMEN: Soft, nontender, nondistended. Bowel sounds present.  EXTREMITIES: No  edema b/l.    NEUROLOGIC: nonfocal  patient is alert and awake    Condition at discharge: fair  The results of significant diagnostics from this hospitalization (including imaging, microbiology, ancillary and laboratory) are listed below for reference.   Imaging Studies: DG C-Arm 1-60 Min-No Report Result Date:  11/01/2023 Fluoroscopy was utilized by the requesting physician.  No radiographic interpretation.   MR ABDOMEN MRCP W WO CONTAST Result Date: 10/31/2023 EXAM: MRCP WITH AND WITHOUT IV CONTRAST 10/30/2023 09:17:00 PM TECHNIQUE: Multisequence, multiplanar magnetic resonance images of the abdomen with and without intravenous contrast. MRCP sequences were performed. COMPARISON: 07/20/2023 CLINICAL HISTORY: RUQ abdominal pain, biliary disease suspected, no prior imaging; recent ERCP, cholecystectomy, elevated LFTs, evaluation for retained stone, abdominal pain. Per RN: Patient to ED via POV for generalized weakness and chills. Patient states symptoms started this AM. Denies pain. Patient reports having his gallbladder removed approximately 1 month ago and needs stents placement. FINDINGS: LIVER: There is heterogeneous arterial phase enhancement within the liver. Within the periphery of the lateral segment of the left hepatic lobe there is an ovoid, rim enhancing, T2 hyperintense structure measuring 1.4 x 0.6 cm. This is new when compared with the examination from 07/20/2023. A mildly dilated biliary radical with mural enhancement in the lateral segment of the left lobe leads up to this new fluid collection, image 45 / 26. GALLBLADDER AND BILIARY SYSTEM: Status post cholecystectomy. Prominent cystic duct remnant is identified within the gallbladder fossa. A common bile duct stent is in place, which (along with motion artifact) limits assessment for underlying choledocholithiasis. There does appear to be some mural enhancement of the common bile duct as well as the central biliary radicals. Persistent mild intrahepatic bile duct dilatation. SPLEEN: Unremarkable. PANCREAS/PANCREATIC DUCT: Visualized pancreas is unremarkable. No pancreatic ductal dilatation. ADRENAL GLANDS: Unremarkable. KIDNEYS: Multiple simple-appearing bilateral kidney cysts  are noted measuring up to 2.2 cm. No follow-up imaging recommended LYMPH  NODES: No upper abdominal adenopathy. 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 This measures 3.4 cm in maximum diameter. PERITONEUM: No ascites. ABDOMINAL WALL: No hernia. No mass. BOWEL: Dilated fluid-filled appendix which measures 1.6 cm, image 15 / 3 without surrounding inflammatory changes. This is similar in thickness when compared with 03/19/2023 but when compared with more remote studies from 2012 this is a new finding. Findings raise the question of underlying mucocele. Colonic diverticulosis without signs of acute diverticulitis. No bowel obstruction. BONES: No acute abnormality or worrisome osseous lesion. SOFT TISSUES: Unremarkable. MISCELLANEOUS: Unremarkable. IMPRESSION: 1. New 1.4 x 0.6 cm ovoid rim-enhancing T2-hyperintense fluid collection in the peripheral lateral segment of the left hepatic lobe, with a mildly dilated biliary radical showing mural enhancement leading to this collection. Imaging findings are concerning for small peripheral liver abscess and cholangitis. 2. Status post cholecystectomy with prominent cystic duct remnant and common bile duct stent limiting assessment for choledocholithiasis; mural enhancement of the common bile duct and central biliary radicals. 3. Dilated fluid-filled appendix measuring 1.6 cm without surrounding inflammatory changes, raising concern for mucocele; unchanged from 03/19/23 but new compared to 2022. Recommend referral to surgery for further management. 4. 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 According to consensus criteria, follow-up imaging in 3 years is recommended. Electronically signed by: Waddell Calk MD 10/31/2023 05:03 AM EDT RP Workstation: [id number]   MR 3D Recon At Scanner Result Date: 10/31/2023 EXAM: MRCP WITH AND WITHOUT IV CONTRAST 10/30/2023 09:17:00 PM TECHNIQUE: Multisequence, multiplanar magnetic resonance images of the abdomen with and without intravenous  contrast. MRCP sequences were performed. COMPARISON: 07/20/2023 CLINICAL HISTORY: RUQ abdominal pain, biliary disease suspected, no prior imaging; recent ERCP, cholecystectomy, elevated LFTs, evaluation for retained stone, abdominal pain. Per RN: Patient to ED via POV for generalized weakness and chills. Patient states symptoms started this AM. Denies pain. Patient reports having his gallbladder removed approximately 1 month ago and needs stents placement. FINDINGS: LIVER: There is heterogeneous arterial phase enhancement within the liver. Within the periphery of the lateral segment of the left hepatic lobe there is an ovoid, rim enhancing, T2 hyperintense structure measuring 1.4 x 0.6 cm. This is new when compared with the examination from 07/20/2023. A mildly dilated biliary radical with mural enhancement in the lateral segment of the left lobe leads up to this new fluid collection, image 45 / 26. GALLBLADDER AND BILIARY SYSTEM: Status post cholecystectomy. Prominent cystic duct remnant is identified within the gallbladder fossa. A common bile duct stent is in place, which (along with motion artifact) limits assessment for underlying choledocholithiasis. There does appear to be some mural enhancement of the common bile duct as well as the central biliary radicals. Persistent mild intrahepatic bile duct dilatation. SPLEEN: Unremarkable. PANCREAS/PANCREATIC DUCT: Visualized pancreas is unremarkable. No pancreatic ductal dilatation. ADRENAL GLANDS: Unremarkable. KIDNEYS: Multiple simple-appearing bilateral kidney cysts are noted measuring up to 2.2 cm. No follow-up imaging recommended LYMPH NODES: No upper abdominal adenopathy. 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 This measures 3.4 cm in maximum diameter. PERITONEUM: No ascites. ABDOMINAL WALL: No hernia. No mass. BOWEL: Dilated fluid-filled appendix which measures 1.6 cm, image 15 / 3 without surrounding inflammatory  changes. This is similar in thickness when compared with 03/19/2023 but when compared with more remote studies from 2012 this is a new finding. Findings raise the question of underlying mucocele. Colonic diverticulosis without signs  of acute diverticulitis. No bowel obstruction. BONES: No acute abnormality or worrisome osseous lesion. SOFT TISSUES: Unremarkable. MISCELLANEOUS: Unremarkable. IMPRESSION: 1. New 1.4 x 0.6 cm ovoid rim-enhancing T2-hyperintense fluid collection in the peripheral lateral segment of the left hepatic lobe, with a mildly dilated biliary radical showing mural enhancement leading to this collection. Imaging findings are concerning for small peripheral liver abscess and cholangitis. 2. Status post cholecystectomy with prominent cystic duct remnant and common bile duct stent limiting assessment for choledocholithiasis; mural enhancement of the common bile duct and central biliary radicals. 3. Dilated fluid-filled appendix measuring 1.6 cm without surrounding inflammatory changes, raising concern for mucocele; unchanged from 03/19/23 but new compared to 2022. Recommend referral to surgery for further management. 4. 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 According to consensus criteria, follow-up imaging in 3 years is recommended. Electronically signed by: Waddell Calk MD 10/31/2023 05:03 AM EDT RP Workstation: [id number]   DG Chest 2 View Result Date: 10/30/2023 CLINICAL DATA:  dyspnea EXAM: CHEST - 2 VIEW COMPARISON:  July 20, 2023 FINDINGS: No focal airspace consolidation, pleural effusion, or pneumothorax. No cardiomegaly. Hiatal hernia. Tortuous aorta with aortic atherosclerosis. No acute fracture or destructive lesions. Multilevel thoracic osteophytosis. IMPRESSION: No acute cardiopulmonary abnormality. Electronically Signed   By: Rogelia Myers M.D.   On: 10/30/2023 19:20   DG ERCP Result Date: 10/08/2023 CLINICAL DATA:  6208 Cholangitis 6208 EXAM: ERCP  COMPARISON:  ERCP, 07/22/2023.  CT AP, 03/19/2023 and 06/17/2020. FLUOROSCOPY: Exposure Index (as provided by the fluoroscopic device): 42 mGy Kerma FINDINGS: Limited oblique planar images of the RIGHT upper quadrant obtained C-arm. A pre-existing plastic biliary stent is present. Images demonstrating flexible endoscopy, biliary duct cannulation, plastic stent removal, retrograde cholangiogram, balloon sweep and 2X plastic biliary stent placement. No biliary ductal dilation or evidence of biliary filling defect. Similar degree of stenosis/occlusion of the mid-to-distal common bile duct IMPRESSION: Fluoroscopic imaging for ERCP and plastic biliary stent removal and replacement (x2) For complete description of intra procedural findings, please see performing service dictation. Electronically Signed   By: Thom Hall M.D.   On: 10/08/2023 11:02    Microbiology: Results for orders placed or performed during the hospital encounter of 10/30/23  Resp panel by RT-PCR (RSV, Flu A&B, Covid) Anterior Nasal Swab     Status: None   Collection Time: 10/30/23  3:29 PM   Specimen: Anterior Nasal Swab  Result Value Ref Range Status   SARS Coronavirus 2 by RT PCR NEGATIVE NEGATIVE Final    Comment: (NOTE) SARS-CoV-2 target nucleic acids are NOT DETECTED.  The SARS-CoV-2 RNA is generally detectable in upper respiratory specimens during the acute phase of infection. The lowest concentration of SARS-CoV-2 viral copies this assay can detect is 138 copies/mL. A negative result does not preclude SARS-Cov-2 infection and should not be used as the sole basis for treatment or other patient management decisions. A negative result may occur with  improper specimen collection/handling, submission of specimen other than nasopharyngeal swab, presence of viral mutation(s) within the areas targeted by this assay, and inadequate number of viral copies(<138 copies/mL). A negative result must be combined with clinical  observations, patient history, and epidemiological information. The expected result is Negative.  Fact Sheet for Patients:  BloggerCourse.com  Fact Sheet for Healthcare Providers:  SeriousBroker.it  This test is no t yet approved or cleared by the [place]  FDA and  has been authorized for detection and/or diagnosis of SARS-CoV-2 by FDA under an Emergency Use Authorization (EUA). This EUA  will remain  in effect (meaning this test can be used) for the duration of the COVID-19 declaration under Section 564(b)(1) of the Act, 21 U.S.C.section 360bbb-3(b)(1), unless the authorization is terminated  or revoked sooner.       Influenza A by PCR NEGATIVE NEGATIVE Final   Influenza B by PCR NEGATIVE NEGATIVE Final    Comment: (NOTE) The Xpert Xpress SARS-CoV-2/FLU/RSV plus assay is intended as an aid in the diagnosis of influenza from Nasopharyngeal swab specimens and should not be used as a sole basis for treatment. Nasal washings and aspirates are unacceptable for Xpert Xpress SARS-CoV-2/FLU/RSV testing.  Fact Sheet for Patients: BloggerCourse.com  Fact Sheet for Healthcare Providers: SeriousBroker.it  This test is not yet approved or cleared by the [place]  FDA and has been authorized for detection and/or diagnosis of SARS-CoV-2 by FDA under an Emergency Use Authorization (EUA). This EUA will remain in effect (meaning this test can be used) for the duration of the COVID-19 declaration under Section 564(b)(1) of the Act, 21 U.S.C. section 360bbb-3(b)(1), unless the authorization is terminated or revoked.     Resp Syncytial Virus by PCR NEGATIVE NEGATIVE Final    Comment: (NOTE) Fact Sheet for Patients: BloggerCourse.com  Fact Sheet for Healthcare Providers: SeriousBroker.it  This test is not yet approved or cleared by  the [place]  FDA and has been authorized for detection and/or diagnosis of SARS-CoV-2 by FDA under an Emergency Use Authorization (EUA). This EUA will remain in effect (meaning this test can be used) for the duration of the COVID-19 declaration under Section 564(b)(1) of the Act, 21 U.S.C. section 360bbb-3(b)(1), unless the authorization is terminated or revoked.  Performed at [facility], [address] [place], [place] [address]   Blood culture (routine x 2)     Status: None (Preliminary result)   Collection Time: 10/30/23  6:23 PM   Specimen: BLOOD  Result Value Ref Range Status   Specimen Description BLOOD BLOOD RIGHT FOREARM  Final   Special Requests   Final    BOTTLES DRAWN AEROBIC AND ANAEROBIC Blood Culture results may not be optimal due to an inadequate volume of blood received in culture bottles   Culture   Final    NO GROWTH 3 DAYS Performed at [facility], [address] [place], [place] [address]    Report Status PENDING  Incomplete  Blood culture (routine x 2)     Status: None (Preliminary result)   Collection Time: 10/30/23  6:23 PM   Specimen: BLOOD  Result Value Ref Range Status   Specimen Description BLOOD BLOOD LEFT FOREARM  Final   Special Requests   Final    BOTTLES DRAWN AEROBIC AND ANAEROBIC Blood Culture results may not be optimal due to an inadequate volume of blood received in culture bottles   Culture   Final    NO GROWTH 3 DAYS Performed at [facility], [address] [place], [place] [address]    Report Status PENDING  Incomplete    Labs: CBC: Recent Labs  Lab 10/30/23 1528 10/31/23 0545 11/01/23 0542 11/02/23 1222  WBC 12.9* 8.2 7.1 6.9  HGB 11.5* 10.1* 10.8* 12.3*  HCT 34.2* 30.2* 32.1* 37.4*  MCV 93.7 95.3 93.3 94.9  PLT 381 341 394 472*   Basic Metabolic Panel: Recent Labs  Lab 10/30/23 1528 10/31/23 0545 11/01/23 0542 11/02/23 1222  NA 125* 134* 135 139  K 4.0 3.7 3.7 4.5  CL 95* 103  104 105  CO2 18*  23 21* 24  GLUCOSE 115* 83 84 109*  BUN 15 14 15 11   CREATININE 0.77 0.89 1.01 1.08  CALCIUM 8.2* 7.9* 8.1* 9.0   Liver Function Tests: Recent Labs  Lab 10/30/23 1528 10/31/23 0545 11/01/23 0542 11/02/23 1222  AST 59* 38 40 58*  ALT 55* 41 39 41  ALKPHOS 340* 247* 270* 315*  BILITOT 1.7* 1.4* 1.2 1.4*  PROT 7.3 6.2* 6.7 7.7  ALBUMIN 2.9* 2.5* 2.5* 3.0*   CBG: No results for input(s): GLUCAP in the last 168 hours.  Discharge time spent: greater than 30 minutes.  Signed: Leita Blanch, MD [facility] 11/02/2023

## 2023-11-02 NOTE — Plan of Care (Signed)

## 2023-11-02 NOTE — Progress Notes (Signed)
 MD order received in [facility] to discharge pt home today; verbally reviewed AVS with pt, no questions voiced at this time; pt's Rx for Augmentin  delivered by the [facility] to the pt in Room 122B; pt's discharge pending arrival of his wife for a ride home; verbally instructed pt to use the nurse call bell when she gets here in order for staff to get a wheelchair for him

## 2023-11-02 NOTE — Plan of Care (Signed)
  Problem: Fluid Volume: Goal: Hemodynamic stability will improve Outcome: Progressing   Problem: Clinical Measurements: Goal: Diagnostic test results will improve Outcome: Progressing Goal: Signs and symptoms of infection will decrease Outcome: Progressing   Problem: Respiratory: Goal: Ability to maintain adequate ventilation will improve Outcome: Progressing   Problem: Education: Goal: Knowledge of General Education information will improve Description: Including pain rating scale, medication(s)/side effects and non-pharmacologic comfort measures Outcome: Progressing   Problem: Health Behavior/Discharge Planning: Goal: Ability to manage health-related needs will improve Outcome: Progressing   Problem: Clinical Measurements: Goal: Ability to maintain clinical measurements within normal limits will improve Outcome: Progressing Goal: Will remain free from infection Outcome: Progressing Goal: Diagnostic test results will improve Outcome: Progressing Goal: Respiratory complications will improve Outcome: Progressing Goal: Cardiovascular complication will be avoided Outcome: Progressing

## 2023-11-02 NOTE — Discharge Instructions (Signed)
 Patient advised to return to the emergency room if he has right upper quadrant pain, high-grade fever, nausea vomiting.  Keep your appointment on Monday with G.I. at [facility]

## 2023-11-02 NOTE — Progress Notes (Signed)
 [facility] Gastroenterology Inpatient Progress Note    Subjective: Patient seen for follow-up of biliary stricture cytology, bile duct stent replacement for presumed cholangitis.  Patient reports resolution of chills and fatigue.  He denies abdominal pain.  Objective: Vital signs in last 24 hours: Temp:  [97.5 F (36.4 C)-98.1 F (36.7 C)] 98.1 F (36.7 C) (09/27 0753) Pulse Rate:  [68-79] 68 (09/27 0753) Resp:  [18-33] 18 (09/27 0753) BP: (144-182)/(70-87) 159/87 (09/27 0753) SpO2:  [95 %-98 %] 96 % (09/27 0753) Blood pressure (!) 159/87, pulse 68, temperature 98.1 F (36.7 C), resp. rate 18, height 5' 10 (1.778 m), weight 88.5 kg, SpO2 96%.    Intake/Output from previous day: 09/26 0701 - 09/27 0700 In: 637.9 [I.V.:537.9; IV Piggyback:100] Out: 700 [Urine:700]  Intake/Output this shift: Total I/O In: 340 [P.O.:240; IV Piggyback:100] Out: 250 [Urine:250]   Gen: NAD. Appears comfortable.  HEENT: [place]/AT. PERRLA. Normal external ear exam.  Chest: CTA, no wheezes.  CV: RR nl S1, S2. No gallops.  Abd: soft, nt, nd. BS+  Ext: no edema. Pulses 2+  Neuro: Alert and oriented. Judgement appears normal. Nonfocal.   Lab Results: Results for orders placed or performed during the hospital encounter of 10/30/23 (from the past 24 hours)  CBC     Status: Abnormal   Collection Time: 11/02/23 12:22 PM  Result Value Ref Range   WBC 6.9 4.0 - 10.5 K/uL   RBC 3.94 (L) 4.22 - 5.81 MIL/uL   Hemoglobin 12.3 (L) 13.0 - 17.0 g/dL   HCT 62.5 (L) 60.9 - 47.9 %   MCV 94.9 80.0 - 100.0 fL   MCH 31.2 26.0 - 34.0 pg   MCHC 32.9 30.0 - 36.0 g/dL   RDW 84.5 88.4 - 84.4 %   Platelets 472 (H) 150 - 400 K/uL   nRBC 0.0 0.0 - 0.2 %  Comprehensive metabolic panel     Status: Abnormal   Collection Time: 11/02/23 12:22 PM  Result Value Ref Range   Sodium 139 135 - 145 mmol/L   Potassium 4.5 3.5 - 5.1 mmol/L   Chloride 105 98 - 111 mmol/L   CO2 24 22 - 32 mmol/L   Glucose, Bld 109 (H)  70 - 99 mg/dL   BUN 11 8 - 23 mg/dL   Creatinine, Ser 8.91 0.61 - 1.24 mg/dL   Calcium 9.0 8.9 - 89.6 mg/dL   Total Protein 7.7 6.5 - 8.1 g/dL   Albumin 3.0 (L) 3.5 - 5.0 g/dL   AST 58 (H) 15 - 41 U/L   ALT 41 0 - 44 U/L   Alkaline Phosphatase 315 (H) 38 - 126 U/L   Total Bilirubin 1.4 (H) 0.0 - 1.2 mg/dL   GFR, Estimated >39 >39 mL/min   Anion gap 10 5 - 15     Recent Labs    10/31/23 0545 11/01/23 0542 11/02/23 1222  WBC 8.2 7.1 6.9  HGB 10.1* 10.8* 12.3*  HCT 30.2* 32.1* 37.4*  PLT 341 394 472*   BMET Recent Labs    10/31/23 0545 11/01/23 0542 11/02/23 1222  NA 134* 135 139  K 3.7 3.7 4.5  CL 103 104 105  CO2 23 21* 24  GLUCOSE 83 84 109*  BUN 14 15 11   CREATININE 0.89 1.01 1.08  CALCIUM 7.9* 8.1* 9.0   LFT Recent Labs    11/02/23 1222  PROT 7.7  ALBUMIN 3.0*  AST 58*  ALT 41  ALKPHOS 315*  BILITOT 1.4*   PT/INR No  results for input(s): LABPROT, INR in the last 72 hours. Hepatitis Panel No results for input(s): HEPBSAG, HCVAB, HEPAIGM, HEPBIGM in the last 72 hours. C-Diff No results for input(s): CDIFFTOX in the last 72 hours. No results for input(s): CDIFFPCR in the last 72 hours.   Studies/Results: DG C-Arm 1-60 Min-No Report Result Date: 11/01/2023 Fluoroscopy was utilized by the requesting physician.  No radiographic interpretation.    Scheduled Inpatient Medications:    aspirin  EC  81 mg Oral Daily   finasteride   5 mg Oral Daily   Influenza vac split trivalent PF  0.5 mL Intramuscular Tomorrow-1000   pantoprazole   40 mg Oral Daily   pyridostigmine   60 mg Oral BID   tamsulosin   0.4 mg Oral Daily    Continuous Inpatient Infusions:    ceFEPime  (MAXIPIME ) IV 2 g (11/02/23 1239)   metronidazole  Stopped (11/02/23 1045)    PRN Inpatient Medications:  acetaminophen  **OR** acetaminophen , albuterol , HYDROcodone -acetaminophen , ketorolac , morphine  injection, ondansetron  **OR** ondansetron  (ZOFRAN ) IV  Miscellaneous: Bile  duct cytology currently pending  Assessment:  Principal Problem:   Sepsis (HCC) Active Problems:   Stricture and stenosis of esophagus   Crohn's disease (HCC)   Benign essential hypertension   Benign enlargement of prostate   Chronic obstructive pulmonary disease (HCC)   Biliary tract infection, suspected   S/P laparoscopic cholecystectomy 07/2023 prior ERCP with biliary stent for acalculous cholecystitis   Myasthenia gravis (HCC)   Hard of hearing   Sepsis, unspecified organism (HCC)   Acute metabolic acidosis   Acute cholangitis 83 year old male s/p ERCP 11/01/2023 with Dr. Jinny who replaced common bile duct stent for midportion biliary stricture.  Cytology is currently pending.  Patient is followed by [facility] gastroenterologist.  Patient appears to have no postprocedural complications and clinically appears improved.  Alkaline phosphatase has increased to 315, possibly as a result of instrumentation of the biliary tract.  Plan:  1.  Continue diet as tolerated. 2.  Monitor vital signs. 3.  Daily hepatic function panel. 4.  Following along with you.  Omaya Nieland K. Aundria, M.D. 11/02/2023, 1:29 PM

## 2023-11-04 ENCOUNTER — Telehealth: Payer: Self-pay

## 2023-11-04 LAB — CULTURE, BLOOD (ROUTINE X 2)
Culture: NO GROWTH
Culture: NO GROWTH

## 2023-11-04 LAB — CYTOLOGY - NON PAP

## 2023-11-04 NOTE — Anesthesia Postprocedure Evaluation (Signed)
 Anesthesia Post Note  Patient: Kyle SWAMY Sr.  Procedure(s) Performed: ERCP, WITH INTERVENTION IF INDICATED STENT REMOVAL  Patient location during evaluation: PACU Anesthesia Type: General Level of consciousness: awake and alert Pain management: pain level controlled Vital Signs Assessment: post-procedure vital signs reviewed and stable Respiratory status: nonlabored ventilation Cardiovascular status: stable Anesthetic complications: no   No notable events documented.   Last Vitals:  Vitals:   11/02/23 0438 11/02/23 0753  BP: (!) 146/81 (!) 159/87  Pulse: 70 68  Resp:  18  Temp: 36.7 C 36.7 C  SpO2: 95% 96%    Last Pain:  Vitals:   11/02/23 0931  TempSrc:   PainSc: 0-No pain                 VAN STAVEREN,Mar Zettler

## 2023-11-04 NOTE — Telephone Encounter (Signed)
 Need to schedule stent removal

## 2023-11-05 NOTE — Telephone Encounter (Signed)
 Called to schedule stent removal ERCP for December, no answer and unable to lmovm

## 2023-11-09 ENCOUNTER — Ambulatory Visit: Payer: Self-pay | Admitting: Gastroenterology

## 2023-11-11 ENCOUNTER — Other Ambulatory Visit: Payer: Self-pay

## 2023-11-11 DIAGNOSIS — K831 Obstruction of bile duct: Secondary | ICD-10-CM

## 2023-11-12 NOTE — Telephone Encounter (Signed)
 Repeat ERCP scheduled for 01/27/2024

## 2023-12-03 ENCOUNTER — Ambulatory Visit: Admitting: Internal Medicine

## 2023-12-03 ENCOUNTER — Ambulatory Visit

## 2023-12-03 ENCOUNTER — Other Ambulatory Visit

## 2023-12-10 ENCOUNTER — Encounter: Payer: Self-pay | Admitting: Internal Medicine

## 2023-12-10 ENCOUNTER — Inpatient Hospital Stay

## 2023-12-10 ENCOUNTER — Inpatient Hospital Stay: Attending: Internal Medicine

## 2023-12-10 ENCOUNTER — Inpatient Hospital Stay (HOSPITAL_BASED_OUTPATIENT_CLINIC_OR_DEPARTMENT_OTHER): Admitting: Internal Medicine

## 2023-12-10 VITALS — BP 87/60 | HR 78 | Resp 18 | Ht 70.0 in | Wt 184.7 lb

## 2023-12-10 DIAGNOSIS — D500 Iron deficiency anemia secondary to blood loss (chronic): Secondary | ICD-10-CM | POA: Insufficient documentation

## 2023-12-10 DIAGNOSIS — D649 Anemia, unspecified: Secondary | ICD-10-CM

## 2023-12-10 DIAGNOSIS — K922 Gastrointestinal hemorrhage, unspecified: Secondary | ICD-10-CM | POA: Insufficient documentation

## 2023-12-10 DIAGNOSIS — Z87891 Personal history of nicotine dependence: Secondary | ICD-10-CM | POA: Insufficient documentation

## 2023-12-10 LAB — CBC WITH DIFFERENTIAL (CANCER CENTER ONLY)
Abs Immature Granulocytes: 0.04 K/uL (ref 0.00–0.07)
Basophils Absolute: 0.1 K/uL (ref 0.0–0.1)
Basophils Relative: 1 %
Eosinophils Absolute: 0.2 K/uL (ref 0.0–0.5)
Eosinophils Relative: 2 %
HCT: 41 % (ref 39.0–52.0)
Hemoglobin: 13.4 g/dL (ref 13.0–17.0)
Immature Granulocytes: 1 %
Lymphocytes Relative: 22 %
Lymphs Abs: 1.6 K/uL (ref 0.7–4.0)
MCH: 31.1 pg (ref 26.0–34.0)
MCHC: 32.7 g/dL (ref 30.0–36.0)
MCV: 95.1 fL (ref 80.0–100.0)
Monocytes Absolute: 0.5 K/uL (ref 0.1–1.0)
Monocytes Relative: 7 %
Neutro Abs: 5 K/uL (ref 1.7–7.7)
Neutrophils Relative %: 67 %
Platelet Count: 390 K/uL (ref 150–400)
RBC: 4.31 MIL/uL (ref 4.22–5.81)
RDW: 15.2 % (ref 11.5–15.5)
WBC Count: 7.4 K/uL (ref 4.0–10.5)
nRBC: 0 % (ref 0.0–0.2)

## 2023-12-10 LAB — FERRITIN: Ferritin: 48 ng/mL (ref 24–336)

## 2023-12-10 LAB — BASIC METABOLIC PANEL - CANCER CENTER ONLY
Anion gap: 9 (ref 5–15)
BUN: 16 mg/dL (ref 8–23)
CO2: 23 mmol/L (ref 22–32)
Calcium: 9.3 mg/dL (ref 8.9–10.3)
Chloride: 100 mmol/L (ref 98–111)
Creatinine: 1.15 mg/dL (ref 0.61–1.24)
GFR, Estimated: >60 mL/min (ref >60)
Glucose, Bld: 146 mg/dL — ABNORMAL HIGH (ref 70–99)
Potassium: 5 mmol/L (ref 3.5–5.1)
Sodium: 132 mmol/L — ABNORMAL LOW (ref 135–145)

## 2023-12-10 LAB — IRON AND TIBC
Iron: 83 ug/dL (ref 45–182)
Saturation Ratios: 27 % (ref 17.9–39.5)
TIBC: 307 ug/dL (ref 250–450)
UIBC: 224 ug/dL

## 2023-12-10 NOTE — Assessment & Plan Note (Addendum)
#   Anemia- Hb-symptomatic.  Likely due to iron  deficiency - from etiology GI blood loss-hb-  8.6, but still anemia with evidence of iron -deficiency with ferritin 6, serum iron  14, TIBC 441, and iron  sat 3%.    # S/p venofer - improved Hb today at 13.4- HOLD venfoer today; continue PO iron .   #Etiology of iron  deficiency: sec GI bleed [colo- FEB 2025- [facility]- [facility]  s/p embolization]- EGD- large hiatal hernia with single Cameron ulcer status post APC, gastritis C-scope showed multiple diverticuli, colon filled with blood with more fresh blood on the left side NM tagged pRBC scan on 2/12 showed the source to be the hepatic flexure. He underwent embolization with Dr. Jama on 2/13- stable; on PO iron  [black stool]  # Biliary obstruction/cholangitis sepsis-moderate: S/p ERCP with biliary sphincterectomy and plastic stent to CBD. Biliary tree was swept and pus was found. Cell cytology obtained. Pathology showed atypical cells favoring reactive inflammatory change. General surgery consulted, s/p lap chole on 07/25/23. SEP 2025- sepsis POA liked due to stent infection/liver abscess seen on MRCP-currently resolved.  # Hx of MG [on MXT- pyridostimine]- stable.  # DISPOSITION: # HOLD venofer  today # follow up as needed-- Dr.B

## 2023-12-10 NOTE — Progress Notes (Signed)
 11/02/23 and 07/26/23 [facility] for sepsis.

## 2023-12-10 NOTE — Progress Notes (Signed)
 [facility] CONSULT NOTE  Patient Care Team: Diedra Lame, MD as PCP - General (Family Medicine) Rennie Cindy SAUNDERS, MD as Consulting Physician (Oncology) Rennie Cindy SAUNDERS, MD as Consulting Physician (Hematology and Oncology)  CHIEF COMPLAINTS/PURPOSE OF CONSULTATION: ANEMIA   HEMATOLOGY HISTORY  # ANEMIA[Hb; MCV-platelets- WBC; Iron  sat; ferritin;  GFR- CT/US - ;   HISTORY OF PRESENTING ILLNESS:  [name] 83 y.o.  male pleasant patient PMH of HTN, HLD, ocular MG, GERD, BPH, and COPD presents to the [facility] GI clinic for follow-up of Crohn's colitis in clinical  remission-  GI bleed-in February 2025.  Needing embolization.  with iron  deficiency anemia.  In the interim patient was admitted to the hospital for sepsis-of biliary source.  In the interim- patient s/p venofer . Patient has been on oral iron .   Denies any weight loss.  Review of Systems  Constitutional:  Positive for malaise/fatigue. Negative for chills, diaphoresis, fever and weight loss.  HENT:  Negative for nosebleeds and sore throat.   Eyes:  Negative for double vision.  Respiratory:  Negative for cough, hemoptysis, sputum production and wheezing.   Cardiovascular:  Negative for chest pain, palpitations, orthopnea and leg swelling.  Gastrointestinal:  Negative for abdominal pain, blood in stool, constipation, diarrhea, heartburn, melena, nausea and vomiting.  Genitourinary:  Negative for dysuria, frequency and urgency.  Musculoskeletal:  Negative for back pain and joint pain.  Skin: Negative.  Negative for itching and rash.  Neurological:  Negative for dizziness, tingling, focal weakness, weakness and headaches.  Endo/Heme/Allergies:  Does not bruise/bleed easily.  Psychiatric/Behavioral:  Negative for depression. The patient is not nervous/anxious and does not have insomnia.      MEDICAL HISTORY:  Past Medical History:  Diagnosis Date   Arthritis    COPD (chronic obstructive  pulmonary disease) (HCC)    Crohn's disease (HCC)    Emphysema, unspecified (HCC)    Hypertension    Myasthenia gravis (HCC)     SURGICAL HISTORY: Past Surgical History:  Procedure Laterality Date   BILIARY BRUSHING  07/21/2023   Procedure: BRUSH BIOPSY, BILE DUCT;  Surgeon: Rollin Dover, MD;  Location: [facility] ENDOSCOPY;  Service: Gastroenterology;;   BILIARY BRUSHING  10/04/2023   Procedure: BRUSH BIOPSY, BILE DUCT;  Surgeon: Rollin Dover, MD;  Location: [facility] ENDOSCOPY;  Service: Gastroenterology;;   BILIARY STENT PLACEMENT  07/21/2023   Procedure: INSERTION, STENT, BILE DUCT;  Surgeon: Rollin Dover, MD;  Location: [facility] ENDOSCOPY;  Service: Gastroenterology;;   BILIARY STENT PLACEMENT N/A 10/04/2023   Procedure: INSERTION, STENT, BILE DUCT;  Surgeon: Rollin Dover, MD;  Location: [facility] ENDOSCOPY;  Service: Gastroenterology;  Laterality: N/A;   CHOLECYSTECTOMY N/A 07/25/2023   Procedure: LAPAROSCOPIC CHOLECYSTECTOMY;  Surgeon: Ebbie Cough, MD;  Location[facility] OR;  Service: General;  Laterality: N/A;  WITH ICG DYE   COLONOSCOPY WITH PROPOFOL  N/A 03/20/2023   Procedure: COLONOSCOPY WITH PROPOFOL ;  Surgeon: Jinny Carmine, MD;  Location: [facility] ENDOSCOPY;  Service: Endoscopy;  Laterality: N/A;   EMBOLIZATION (CATH LAB) N/A 03/21/2023   Procedure: EMBOLIZATION;  Surgeon: Jama Cordella MATSU, MD;  Location: [facility] INVASIVE CV LAB;  Service: Cardiovascular;  Laterality: N/A;   ERCP N/A 07/21/2023   Procedure: ERCP, WITH INTERVENTION IF INDICATED;  Surgeon: Rollin Dover, MD;  Location: [facility] ENDOSCOPY;  Service: Gastroenterology;  Laterality: N/A;   ERCP N/A 10/04/2023   Procedure: ERCP, WITH INTERVENTION IF INDICATED;  Surgeon: Rollin Dover, MD;  Location: [facility] ENDOSCOPY;  Service: Gastroenterology;  Laterality: N/A;   ERCP N/A 11/01/2023  Procedure: ERCP, WITH INTERVENTION IF INDICATED;  Surgeon: Jinny Carmine, MD;  Location: [facility] ENDOSCOPY;  Service: Endoscopy;  Laterality: N/A;   ESOPHAGOGASTRODUODENOSCOPY N/A  03/19/2023   Procedure: ESOPHAGOGASTRODUODENOSCOPY (EGD);  Surgeon: Jinny Carmine, MD;  Location: [facility] ENDOSCOPY;  Service: Endoscopy;  Laterality: N/A;   ESOPHAGOGASTRODUODENOSCOPY N/A 10/04/2023   Procedure: EGD (ESOPHAGOGASTRODUODENOSCOPY);  Surgeon: Rollin Dover, MD;  Location: [facility] ENDOSCOPY;  Service: Gastroenterology;  Laterality: N/A;   EUS N/A 10/04/2023   Procedure: ULTRASOUND, UPPER GI TRACT, ENDOSCOPIC;  Surgeon: Rollin Dover, MD;  Location: [facility] ENDOSCOPY;  Service: Gastroenterology;  Laterality: N/A;   JOINT REPLACEMENT Left    total knee replacement    Dr. 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   Procedure: RIGHT TOTAL KNEE ARTHROPLASTY;  Surgeon: Liam Lerner, MD;  Location: [facility] ORS;  Service: Orthopedics;  Laterality: Right;   WISDOM TOOTH EXTRACTION      SOCIAL HISTORY: Social History   Socioeconomic History   Marital status: Married    Spouse name: Not on file   Number of children: Not on file   Years of education: Not on file   Highest education level: Not on file  Occupational History   Not on file  Tobacco Use   Smoking status: Former    Current packs/day: 1.50    Types: Cigarettes   Smokeless tobacco: Never  Vaping Use   Vaping status: Never Used  Substance and Sexual Activity   Alcohol use: Not Currently   Drug use: Never   Sexual activity: Not Currently  Other Topics Concern   Not on file  Social History Narrative   Not on file   Social Drivers of Health   Financial Resource Strain: Patient Declined (02/07/2023)    Received from [facility]   Overall Financial Resource Strain (CARDIA)    Difficulty of Paying Living Expenses: Patient declined  Food Insecurity: No Food Insecurity (10/30/2023)   Hunger Vital Sign    Worried About Running Out of Food in the Last Year: Never true    Ran Out of Food in the Last Year: Never true  Transportation Needs: No Transportation Needs (10/30/2023)   PRAPARE - Administrator, Civil Service (Medical): No    Lack of Transportation (Non-Medical): No  Physical Activity: Not on file  Stress: Not on file  Social Connections: Socially Integrated (10/30/2023)   Social Connection and Isolation Panel    Frequency of Communication with Friends and Family: Twice a week    Frequency of Social Gatherings with Friends and Family: More than three times a week    Attends Religious Services: More than 4 times per year    Active Member of [employer] or Organizations: Yes    Attends Banker Meetings: More than 4 times per year    Marital Status: Married  Catering Manager Violence: Not At Risk (10/30/2023)   Humiliation, Afraid, Rape, and Kick questionnaire    Fear of Current or Ex-Partner: No    Emotionally Abused: No    Physically Abused: No    Sexually Abused:  No    FAMILY HISTORY: Family History  Problem Relation Age of Onset   Brain cancer Father     ALLERGIES:  has no known allergies.  MEDICATIONS:  Current Outpatient Medications  Medication Sig Dispense Refill   acetaminophen  (TYLENOL ) 325 MG tablet Take 650 mg by mouth 3 (three) times daily as needed (pain).     albuterol  (PROVENTIL  HFA;VENTOLIN  HFA) 108 (90 Base) MCG/ACT inhaler Inhale 2 puffs into the lungs every 6 (six) hours as needed for wheezing or shortness of breath. 1 Inhaler 0   aspirin  EC 81 MG tablet Take 81 mg by mouth daily.      atenolol  (TENORMIN ) 25 MG tablet Take 1 tablet by mouth daily.     balsalazide (COLAZAL ) 750 MG capsule Take 750 mg by mouth 3 (three) times  daily.  3   Biotin 1 MG CAPS Take 1 mg by mouth daily.     Cholecalciferol (VITAMIN D3) 50 MCG (2000 UT) TABS Take 2,000 Units by mouth daily.     cyanocobalamin  (VITAMIN B12) 1000 MCG tablet Take 1,000 mcg by mouth daily.     Ferrous Sulfate (IRON  PO) Take 1 tablet by mouth daily.     finasteride  (PROSCAR ) 5 MG tablet Take 5 mg by mouth daily.  3   folic acid  (FOLVITE ) 1 MG tablet Take 1 mg by mouth daily.     hydrALAZINE  (APRESOLINE ) 50 MG tablet Take 1 tablet (50 mg total) by mouth every 12 (twelve) hours. (Patient taking differently: Take 25 mg by mouth every 12 (twelve) hours.) 60 tablet 0   methotrexate  (RHEUMATREX) 2.5 MG tablet Take 2 tablets (5 mg total) by mouth once a week. Every Wednesday  Caution:Chemotherapy. Protect from light. 4 tablet 0   Multiple Vitamins-Minerals (MULTIVITAMIN WITH MINERALS) tablet Take 1 tablet by mouth daily.     Multiple Vitamins-Minerals (PRESERVISION AREDS PO) Take 2 tablets by mouth daily.     Omega-3 Fatty Acids (FISH OIL) 1000 MG CAPS Take 1,000 mg by mouth daily.     omeprazole  (PRILOSEC) 40 MG capsule Take 1 capsule (40 mg total) by mouth 2 (two) times daily before a meal. 60 capsule 0   pyridostigmine  (MESTINON ) 60 MG tablet Take 1 tablet (60 mg total) by mouth in the morning and at bedtime. See Rx fill hx and office visit note 01/18/23     tamsulosin  (FLOMAX ) 0.4 MG CAPS capsule Take 0.4 mg by mouth daily.  3   ursodiol (ACTIGALL) 300 MG capsule Take 600 mg by mouth 2 (two) times daily.     No current facility-administered medications for this visit.   Facility-Administered Medications Ordered in Other Visits  Medication Dose Route Frequency Provider Last Rate Last Admin   albuterol  (PROVENTIL ) (2.5 MG/3ML) 0.083% nebulizer solution 2.5 mg  2.5 mg Nebulization Once Verdia Art, MD         PHYSICAL EXAMINATION:   Vitals:   12/10/23 1427  BP: (!) 87/60  Pulse: 78  Resp: 18  SpO2: 100%    Filed Weights   12/10/23 1427   Weight: 184 lb 11.2 oz (83.8 kg)     Physical Exam Vitals and nursing note reviewed.  HENT:     Head: Normocephalic and atraumatic.     Mouth/Throat:     Pharynx: Oropharynx is clear.  Eyes:     Extraocular Movements: Extraocular movements intact.     Pupils: Pupils are equal, round, and reactive to light.  Cardiovascular:     Rate and Rhythm: Normal  rate and regular rhythm.  Pulmonary:     Comments: Decreased breath sounds bilaterally.  Abdominal:     Palpations: Abdomen is soft.  Musculoskeletal:        General: Normal range of motion.     Cervical back: Normal range of motion.  Skin:    General: Skin is warm.  Neurological:     General: No focal deficit present.     Mental Status: He is alert and oriented to person, place, and time.  Psychiatric:        Behavior: Behavior normal.        Judgment: Judgment normal.      LABORATORY DATA:  I have reviewed the data as listed Lab Results  Component Value Date   WBC 7.4 12/10/2023   HGB 13.4 12/10/2023   HCT 41.0 12/10/2023   MCV 95.1 12/10/2023   PLT 390 12/10/2023   Recent Labs    03/19/23 0927 03/20/23 0258 07/24/23 0800 07/25/23 0520 10/31/23 0545 11/01/23 0542 11/02/23 1222 12/10/23 1422  NA  --    < >  --    < > 134* 135 139 132*  K  --    < >  --    < > 3.7 3.7 4.5 5.0  CL  --    < >  --    < > 103 104 105 100  CO2  --    < >  --    < > 23 21* 24 23  GLUCOSE  --    < >  --    < > 83 84 109* 146*  BUN  --    < >  --    < > 14 15 11 16   CREATININE  --    < >  --    < > 0.89 1.01 1.08 1.15  CALCIUM  --    < >  --    < > 7.9* 8.1* 9.0 9.3  GFRNONAA  --    < >  --    < > >60 >60 >60 >60  PROT 6.4*   < > 6.9   < > 6.2* 6.7 7.7  --   ALBUMIN 3.3*   < > 2.9*   < > 2.5* 2.5* 3.0*  --   AST 22   < > 79*   < > 38 40 58*  --   ALT 14   < > 128*   < > 41 39 41  --   ALKPHOS 43   < > 240*   < > 247* 270* 315*  --   BILITOT 0.7   < > 2.8*   < > 1.4* 1.2 1.4*  --   BILIDIR 0.1  --  1.3*  --   --   --   --   --    IBILI 0.6  --  1.5*  --   --   --   --   --    < > = values in this interval not displayed.    No results found.   ASSESSMENT & PLAN:   Symptomatic anemia # Anemia- Hb-symptomatic.  Likely due to iron  deficiency - from etiology GI blood loss-hb-  8.6, but still anemia with evidence of iron -deficiency with ferritin 6, serum iron  14, TIBC 441, and iron  sat 3%.    # S/p venofer - improved Hb today at 13.4- HOLD venfoer today; continue PO iron .   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 He underwent embolization with Dr. Jama on 2/13- stable; on PO iron  [black stool]  # Biliary obstruction/cholangitis sepsis-moderate: S/p ERCP with biliary sphincterectomy and plastic stent to CBD. Biliary tree was swept and pus was found. Cell cytology obtained. Pathology showed atypical cells favoring reactive inflammatory change. General surgery consulted, s/p lap chole on 07/25/23. SEP 2025- sepsis POA liked due to stent infection/liver abscess seen on MRCP-currently resolved.  # Hx of MG [on MXT- pyridostimine]- stable.  # DISPOSITION: # HOLD venofer  today # follow up as needed-- Dr.B    All questions were answered. The patient knows to call the clinic with any problems, questions or concerns.    Cindy JONELLE Joe, MD 12/10/2023 3:24 PM

## 2024-01-27 ENCOUNTER — Ambulatory Visit
Admission: RE | Admit: 2024-01-27 | Discharge: 2024-01-27 | Disposition: A | Discharge status: Home / Self Care | Attending: Gastroenterology | Admitting: Gastroenterology

## 2024-01-27 ENCOUNTER — Ambulatory Visit

## 2024-01-27 ENCOUNTER — Other Ambulatory Visit: Payer: Self-pay

## 2024-01-27 ENCOUNTER — Encounter: Payer: Self-pay | Admitting: Gastroenterology

## 2024-01-27 ENCOUNTER — Encounter: Admission: RE | Disposition: A | Payer: Self-pay | Source: Home / Self Care | Attending: Gastroenterology

## 2024-01-27 DIAGNOSIS — K8031 Calculus of bile duct with cholangitis, unspecified, with obstruction: Secondary | ICD-10-CM | POA: Insufficient documentation

## 2024-01-27 DIAGNOSIS — Z79899 Other long term (current) drug therapy: Secondary | ICD-10-CM | POA: Diagnosis not present

## 2024-01-27 DIAGNOSIS — Z87891 Personal history of nicotine dependence: Secondary | ICD-10-CM | POA: Insufficient documentation

## 2024-01-27 DIAGNOSIS — K831 Obstruction of bile duct: Secondary | ICD-10-CM

## 2024-01-27 DIAGNOSIS — Z4659 Encounter for fitting and adjustment of other gastrointestinal appliance and device: Secondary | ICD-10-CM

## 2024-01-27 DIAGNOSIS — I10 Essential (primary) hypertension: Secondary | ICD-10-CM | POA: Diagnosis not present

## 2024-01-27 DIAGNOSIS — K219 Gastro-esophageal reflux disease without esophagitis: Secondary | ICD-10-CM | POA: Insufficient documentation

## 2024-01-27 HISTORY — PX: SPHINCTEROTOMY: SHX5279

## 2024-01-27 HISTORY — PX: ERCP: SHX5425

## 2024-01-27 HISTORY — PX: BILIARY BRUSHING: SHX6843

## 2024-01-27 HISTORY — PX: GASTROINTESTINAL STENT REMOVAL: SHX6384

## 2024-01-27 HISTORY — PX: STONE EXTRACTION WITH BASKET: SHX5318

## 2024-01-27 SURGERY — ERCP, WITH INTERVENTION IF INDICATED
Anesthesia: General

## 2024-01-27 MED ORDER — LACTATED RINGERS IV SOLN: Freq: Once | INTRAVENOUS | Status: AC

## 2024-01-27 MED ORDER — PROPOFOL 10 MG/ML IV BOLUS
INTRAVENOUS | Status: AC
  Filled 2024-01-27: qty 20

## 2024-01-27 MED ORDER — LIDOCAINE HCL (PF) 2 % IJ SOLN
INTRAMUSCULAR | Status: AC
  Filled 2024-01-27: qty 5

## 2024-01-27 MED ORDER — GLYCOPYRROLATE 0.2 MG/ML IJ SOLN
INTRAMUSCULAR | Status: DC | PRN
  Administered 2024-01-27: .2 mg via INTRAVENOUS

## 2024-01-27 MED ORDER — PROPOFOL 1000 MG/100ML IV EMUL
INTRAVENOUS | Status: AC
  Filled 2024-01-27: qty 100

## 2024-01-27 MED ORDER — GLYCOPYRROLATE 0.2 MG/ML IJ SOLN
INTRAMUSCULAR | Status: AC
  Filled 2024-01-27: qty 1

## 2024-01-27 MED ORDER — PROPOFOL 500 MG/50ML IV EMUL
INTRAVENOUS | Status: DC | PRN
  Administered 2024-01-27: 100 ug/kg/min via INTRAVENOUS

## 2024-01-27 MED ORDER — SODIUM CHLORIDE 0.9 % IV SOLN: INTRAVENOUS | Status: DC

## 2024-01-27 MED ORDER — PROPOFOL 10 MG/ML IV BOLUS
INTRAVENOUS | Status: DC | PRN
  Administered 2024-01-27: 70 mg via INTRAVENOUS
  Administered 2024-01-27 (×2): 20 mg via INTRAVENOUS
  Administered 2024-01-27: 30 mg via INTRAVENOUS

## 2024-01-27 MED ORDER — LACTATED RINGERS IV SOLN: INTRAVENOUS | Status: DC | PRN

## 2024-01-27 MED ORDER — LIDOCAINE HCL (CARDIAC) PF 100 MG/5ML IV SOSY
PREFILLED_SYRINGE | INTRAVENOUS | Status: DC | PRN
  Administered 2024-01-27: 100 mg via INTRAVENOUS

## 2024-01-27 NOTE — Transfer of Care (Signed)
 Immediate Anesthesia Transfer of Care Note  Patient: Kyle ONEIDA Dec Sr.  Procedure(s) Performed: ERCP, WITH INTERVENTION IF INDICATED EGD, WITH STENT REMOVAL  Patient Location: PACU  Anesthesia Type:General  Level of Consciousness: awake, alert , oriented, and patient cooperative  Airway & Oxygen Therapy: Patient Spontanous Breathing and Patient connected to nasal cannula oxygen  Post-op Assessment: Report given to RN, Post -op Vital signs reviewed and stable, and Patient moving all extremities X 4  Post vital signs: Reviewed and stable  Last Vitals:  Vitals Value Taken Time  BP 117/98 01/27/24 12:05  Temp 35.8 C 01/27/24 12:05  Pulse 88 01/27/24 12:07  Resp 14 01/27/24 12:07  SpO2 92 % 01/27/24 12:07  Vitals shown include unfiled device data.  Last Pain:  Vitals:   01/27/24 1205  TempSrc: Temporal  PainSc: 0-No pain         Complications: No notable events documented.

## 2024-01-27 NOTE — Anesthesia Postprocedure Evaluation (Signed)
"   Anesthesia Post Note  Patient: Kyle Holloway.  Procedure(s) Performed: ERCP, WITH INTERVENTION IF INDICATED EGD, WITH STENT REMOVAL  Patient location during evaluation: PACU Anesthesia Type: General Level of consciousness: awake Pain management: pain level controlled Vital Signs Assessment: post-procedure vital signs reviewed and stable Respiratory status: spontaneous breathing Cardiovascular status: blood pressure returned to baseline Anesthetic complications: no   No notable events documented.   Last Vitals:  Vitals:   01/27/24 1222 01/27/24 1228  BP: (!) 131/90 (!) 142/88  Pulse: 69 66  Resp: (!) 25 11  Temp:    SpO2: 99% 98%    Last Pain:  Vitals:   01/27/24 1222  TempSrc:   PainSc: 0-No pain                 VAN STAVEREN,Julya Alioto      "

## 2024-01-27 NOTE — H&P (Signed)
 "  Kyle Copping, MD [facility] [address], [address] [place], [place] [address] Phone:[phone] Fax : [phone]  Primary Care Physician:  Diedra Lame, MD Primary Gastroenterologist:  Dr. Copping  Pre-Procedure History & Physical: HPI:  Kyle VILORIA Sr. is a 83 y.o. male is here for an ERCP.   Past Medical History:  Diagnosis Date   Arthritis    COPD (chronic obstructive pulmonary disease) (HCC)    Crohn's disease (HCC)    Emphysema, unspecified (HCC)    Hypertension    Myasthenia gravis (HCC)     Past Surgical History:  Procedure Laterality Date   BILIARY BRUSHING  07/21/2023   Procedure: BRUSH BIOPSY, BILE DUCT;  Surgeon: Rollin Dover, MD;  Location: [facility] ENDOSCOPY;  Service: Gastroenterology;;   BILIARY BRUSHING  10/04/2023   Procedure: BRUSH BIOPSY, BILE DUCT;  Surgeon: Rollin Dover, MD;  Location: [facility] ENDOSCOPY;  Service: Gastroenterology;;   BILIARY STENT PLACEMENT  07/21/2023   Procedure: INSERTION, STENT, BILE DUCT;  Surgeon: Rollin Dover, MD;  Location: [facility] ENDOSCOPY;  Service: Gastroenterology;;   BILIARY STENT PLACEMENT N/A 10/04/2023   Procedure: INSERTION, STENT, BILE DUCT;  Surgeon: Rollin Dover, MD;  Location: [facility] ENDOSCOPY;  Service: Gastroenterology;  Laterality: N/A;   CHOLECYSTECTOMY N/A 07/25/2023   Procedure: LAPAROSCOPIC CHOLECYSTECTOMY;  Surgeon: Ebbie Cough, MD;  Location: [facility] OR;  Service: General;  Laterality: N/A;  WITH ICG DYE   COLONOSCOPY WITH PROPOFOL  N/A 03/20/2023   Procedure: COLONOSCOPY WITH PROPOFOL ;  Surgeon: Holloway Rogelia, MD;  Location: [facility] ENDOSCOPY;  Service: Endoscopy;  Laterality: N/A;   EMBOLIZATION (CATH LAB) N/A 03/21/2023   Procedure: EMBOLIZATION;  Surgeon: Jama Cordella MATSU, MD;  Location: [facility] INVASIVE CV LAB;  Service: Cardiovascular;  Laterality: N/A;   ERCP N/A 07/21/2023   Procedure: ERCP, WITH INTERVENTION IF INDICATED;  Surgeon: Rollin Dover, MD;  Location: [facility] ENDOSCOPY;  Service: Gastroenterology;  Laterality: N/A;    ERCP N/A 10/04/2023   Procedure: ERCP, WITH INTERVENTION IF INDICATED;  Surgeon: Rollin Dover, MD;  Location: [facility] ENDOSCOPY;  Service: Gastroenterology;  Laterality: N/A;   ERCP N/A 11/01/2023   Procedure: ERCP, WITH INTERVENTION IF INDICATED;  Surgeon: Holloway Rogelia, MD;  Location: [facility] ENDOSCOPY;  Service: Endoscopy;  Laterality: N/A;   ESOPHAGOGASTRODUODENOSCOPY N/A 03/19/2023   Procedure: ESOPHAGOGASTRODUODENOSCOPY (EGD);  Surgeon: Holloway Rogelia, MD;  Location: [facility] ENDOSCOPY;  Service: Endoscopy;  Laterality: N/A;   ESOPHAGOGASTRODUODENOSCOPY N/A 10/04/2023   Procedure: EGD (ESOPHAGOGASTRODUODENOSCOPY);  Surgeon: Rollin Dover, MD;  Location: [facility] ENDOSCOPY;  Service: Gastroenterology;  Laterality: N/A;   EUS N/A 10/04/2023   Procedure: ULTRASOUND, UPPER GI TRACT, ENDOSCOPIC;  Surgeon: Rollin Dover, MD;  Location: [facility] ENDOSCOPY;  Service: Gastroenterology;  Laterality: N/A;   JOINT REPLACEMENT Left    total knee replacement    Dr. Mardee   SPHINCTEROTOMY  07/21/2023   Procedure: ANNETT, GI;  Surgeon: Rollin Dover, MD;  Location: [facility] ENDOSCOPY;  Service: Gastroenterology;;   CLEDA REMOVAL  10/04/2023   Procedure: STENT REMOVAL;  Surgeon: Rollin Dover, MD;  Location: [facility] ENDOSCOPY;  Service: Gastroenterology;;   CLEDA REMOVAL  11/01/2023   Procedure: STENT REMOVAL;  Surgeon: Holloway Rogelia, MD;  Location: [facility] ENDOSCOPY;  Service: Endoscopy;;   TONSILLECTOMY     TOTAL KNEE ARTHROPLASTY Right 11/20/2021   Procedure: RIGHT TOTAL KNEE ARTHROPLASTY;  Surgeon: Liam Lerner, MD;  Location: [facility] ORS;  Service: Orthopedics;  Laterality: Right;   WISDOM TOOTH EXTRACTION      Prior to Admission medications  Medication Sig Start Date End Date Taking? Authorizing  Provider  aspirin  EC 81 MG tablet Take 81 mg by mouth daily.    Yes [provider]  atenolol  (TENORMIN ) 25 MG tablet Take 1 tablet by mouth daily. 04/19/22 01/27/24 Yes [provider]  balsalazide (COLAZAL ) 750 MG capsule Take  750 mg by mouth 3 (three) times daily. 09/24/17  Yes [provider]  Biotin 1 MG CAPS Take 1 mg by mouth daily.   Yes [provider]  Cholecalciferol (VITAMIN D3) 50 MCG (2000 UT) TABS Take 2,000 Units by mouth daily.   Yes [provider]  cyanocobalamin  (VITAMIN B12) 1000 MCG tablet Take 1,000 mcg by mouth daily.   Yes [provider]  Ferrous Sulfate (IRON  PO) Take 1 tablet by mouth daily.   Yes [provider]  finasteride  (PROSCAR ) 5 MG tablet Take 5 mg by mouth daily. 12/12/17  Yes [provider]  folic acid  (FOLVITE ) 1 MG tablet Take 1 mg by mouth daily. 07/06/14  Yes [provider]  hydrALAZINE  (APRESOLINE ) 50 MG tablet Take 1 tablet (50 mg total) by mouth every 12 (twelve) hours. Patient taking differently: Take 25 mg by mouth every 12 (twelve) hours. 07/26/23  Yes Donnamarie Lebron PARAS, MD  methotrexate  (RHEUMATREX) 2.5 MG tablet Take 2 tablets (5 mg total) by mouth once a week. Every Wednesday  Caution:Chemotherapy. Protect from light. 12/29/17  Yes Salary, Nemiah BIRCH, MD  Multiple Vitamins-Minerals (MULTIVITAMIN WITH MINERALS) tablet Take 1 tablet by mouth daily.   Yes [provider]  Omega-3 Fatty Acids (FISH OIL) 1000 MG CAPS Take 1,000 mg by mouth daily.   Yes [provider]  omeprazole  (PRILOSEC) 40 MG capsule Take 1 capsule (40 mg total) by mouth 2 (two) times daily before a meal. 03/22/23 01/27/24 Yes Maree Hue, MD  pyridostigmine  (MESTINON ) 60 MG tablet Take 1 tablet (60 mg total) by mouth in the morning and at bedtime. See Rx fill hx and office visit note 01/18/23 07/26/23  Yes Donnamarie Lebron PARAS, MD  tamsulosin  (FLOMAX ) 0.4 MG CAPS capsule Take 0.4 mg by mouth daily. 12/12/17  Yes [provider]  acetaminophen  (TYLENOL ) 325 MG tablet Take 650 mg by mouth 3 (three) times daily as needed (pain).    [provider]  albuterol  (PROVENTIL  HFA;VENTOLIN  HFA) 108 (90 Base) MCG/ACT  inhaler Inhale 2 puffs into the lungs every 6 (six) hours as needed for wheezing or shortness of breath. 12/29/17   Salary, Nemiah BIRCH, MD  Multiple Vitamins-Minerals (PRESERVISION AREDS PO) Take 2 tablets by mouth daily.    [provider]  ursodiol (ACTIGALL) 300 MG capsule Take 600 mg by mouth 2 (two) times daily. 11/08/23   [provider]    Allergies as of 11/11/2023   (No Known Allergies)    Family History  Problem Relation Age of Onset   Brain cancer Father     Social History   Socioeconomic History   Marital status: Married    Spouse name: Not on file   Number of children: Not on file   Years of education: Not on file   Highest education level: Not on file  Occupational History   Not on file  Tobacco Use   Smoking status: Former    Current packs/day: 1.50    Types: Cigarettes   Smokeless tobacco: Never  Vaping Use   Vaping status: Never Used  Substance and Sexual Activity   Alcohol use: Not Currently   Drug use: Never   Sexual activity: Not Currently  Other  Topics Concern   Not on file  Social History Narrative   Not on file   Social Drivers of Health   Tobacco Use: Medium Risk (01/27/2024)   Patient History    Smoking Tobacco Use: Former    Smokeless Tobacco Use: Never    Passive Exposure: Not on file  Financial Resource Strain: Patient Declined (02/07/2023)   Received from [facility]   Overall Financial Resource Strain (CARDIA)    Difficulty of Paying Living Expenses: Patient declined  Food Insecurity: No Food Insecurity (10/30/2023)   Epic    Worried About Radiation Protection Practitioner of Food in the Last Year: Never true    Ran Out of Food in the Last Year: Never true  Transportation Needs: No Transportation Needs (10/30/2023)   Epic    Lack of Transportation (Medical): No    Lack of Transportation (Non-Medical): No  Physical Activity: Not on file  Stress: Not on file  Social Connections: Socially Integrated (10/30/2023)   Social  Connection and Isolation Panel    Frequency of Communication with Friends and Family: Twice a week    Frequency of Social Gatherings with Friends and Family: More than three times a week    Attends Religious Services: More than 4 times per year    Active Member of [employer] or Organizations: Yes    Attends Banker Meetings: More than 4 times per year    Marital Status: Married  Catering Manager Violence: Not At Risk (10/30/2023)   Epic    Fear of Current or Ex-Partner: No    Emotionally Abused: No    Physically Abused: No    Sexually Abused: No  Depression (PHQ2-9): Low Risk (12/10/2023)   Depression (PHQ2-9)    PHQ-2 Score: 0  Alcohol Screen: Not on file  Housing: Low Risk (10/30/2023)   Epic    Unable to Pay for Housing in the Last Year: No    Number of Times Moved in the Last Year: 0    Homeless in the Last Year: No  Utilities: Not At Risk (10/30/2023)   Epic    Threatened with loss of utilities: No  Health Literacy: Not on file    Review of Systems: See HPI, otherwise negative ROS  Physical Exam: BP (!) 144/84   Pulse (!) 58   Temp (!) 96 F (35.6 C) (Temporal)   Resp 16   Ht 5' 10 (1.778 m)   Wt 83.5 kg   SpO2 98%   BMI 26.40 kg/m  General:   Alert,  pleasant and cooperative in NAD Head:  Normocephalic and atraumatic. Neck:  Supple; no masses or thyromegaly. Lungs:  Clear throughout to auscultation.    Heart:  Regular rate and rhythm. Abdomen:  Soft, nontender and nondistended. Normal bowel sounds, without guarding, and without rebound.   Neurologic:  Alert and  oriented x4;  grossly normal neurologically.  Impression/Plan: Kyle ONEIDA Dec Sr. is here for an ERCP to be performed for stent removal for CBD stricture  Risks, benefits, limitations, and alternatives regarding  ERCP have been reviewed with the patient.  Questions have been answered.  All parties agreeable.   Kyle Copping, MD  01/27/2024, 11:14 AM "

## 2024-01-27 NOTE — Anesthesia Preprocedure Evaluation (Addendum)
 "                                  Anesthesia Evaluation  Patient identified by MRN, date of birth, ID band Patient awake    Reviewed: Allergy & Precautions, NPO status , Patient's Chart, lab work & pertinent test results  Airway Mallampati: II  TM Distance: >3 FB Neck ROM: Full    Dental  (+) Lower Dentures, Upper Dentures   Pulmonary COPD,  COPD inhaler, Patient abstained from smoking., former smoker   Pulmonary exam normal breath sounds clear to auscultation       Cardiovascular Exercise Tolerance: Good hypertension, Pt. on medications Normal cardiovascular exam Rhythm:Regular Rate:Normal     Neuro/Psych negative neurological ROS  negative psych ROS   GI/Hepatic negative GI ROS, Neg liver ROS,GERD  Medicated,,  Endo/Other  negative endocrine ROS    Renal/GU negative Renal ROS  negative genitourinary   Musculoskeletal   Abdominal Normal abdominal exam  (+)   Peds negative pediatric ROS (+)  Hematology  (+) Blood dyscrasia, anemia   Anesthesia Other Findings Past Medical History: No date: Arthritis No date: COPD (chronic obstructive pulmonary disease) (HCC) No date: Crohn's disease (HCC) No date: Emphysema, unspecified (HCC) No date: Hypertension No date: Myasthenia gravis [facility])  Past Surgical History: 07/21/2023: BILIARY BRUSHING     Comment:  Procedure: BRUSH BIOPSY, BILE DUCT;  Surgeon: Rollin Dover, MD;  Location: [facility] ENDOSCOPY;  Service:               Gastroenterology;; 10/04/2023: BILIARY BRUSHING     Comment:  Procedure: BRUSH BIOPSY, BILE DUCT;  Surgeon: Rollin Dover, MD;  Location: [facility] ENDOSCOPY;  Service:               Gastroenterology;; 07/21/2023: BILIARY STENT PLACEMENT     Comment:  Procedure: INSERTION, STENT, BILE DUCT;  Surgeon: Rollin Dover, MD;  Location: [facility] ENDOSCOPY;  Service:               Gastroenterology;; 10/04/2023: BILIARY STENT PLACEMENT; N/A     Comment:  Procedure:  INSERTION, STENT, BILE DUCT;  Surgeon: Rollin Dover, MD;  Location: [facility] ENDOSCOPY;  Service:               Gastroenterology;  Laterality: N/A; 07/25/2023: CHOLECYSTECTOMY; N/A     Comment:  Procedure: LAPAROSCOPIC CHOLECYSTECTOMY;  Surgeon:               Ebbie Cough, MD;  Location: [facility] OR;  Service:               General;  Laterality: N/A;  WITH ICG DYE 03/20/2023: COLONOSCOPY WITH PROPOFOL ; N/A     Comment:  Procedure: COLONOSCOPY WITH PROPOFOL ;  Surgeon: Jinny Carmine, MD;  Location: [facility] ENDOSCOPY;  Service:               Endoscopy;  Laterality: N/A; 03/21/2023: EMBOLIZATION (CATH LAB); N/A     Comment:  Procedure: EMBOLIZATION;  Surgeon: Jama Cordella MATSU,  MD;  Location: [facility] INVASIVE CV LAB;  Service:               Cardiovascular;  Laterality: N/A; 07/21/2023: ERCP; N/A     Comment:  Procedure: ERCP, WITH INTERVENTION IF INDICATED;                Surgeon: Rollin Dover, MD;  Location: [facility] ENDOSCOPY;                Service: Gastroenterology;  Laterality: N/A; 10/04/2023: ERCP; N/A     Comment:  Procedure: ERCP, WITH INTERVENTION IF INDICATED;                Surgeon: Rollin Dover, MD;  Location: [facility] ENDOSCOPY;                Service: Gastroenterology;  Laterality: N/A; 11/01/2023: ERCP; N/A     Comment:  Procedure: ERCP, WITH INTERVENTION IF INDICATED;                Surgeon: Jinny Carmine, MD;  Location: [facility] ENDOSCOPY;                Service: Endoscopy;  Laterality: N/A; 03/19/2023: ESOPHAGOGASTRODUODENOSCOPY; N/A     Comment:  Procedure: ESOPHAGOGASTRODUODENOSCOPY (EGD);  Surgeon:               Jinny Carmine, MD;  Location: [facility] ENDOSCOPY;  Service:               Endoscopy;  Laterality: N/A; 10/04/2023: ESOPHAGOGASTRODUODENOSCOPY; N/A     Comment:  Procedure: EGD (ESOPHAGOGASTRODUODENOSCOPY);  Surgeon:               Rollin Dover, MD;  Location: [facility] ENDOSCOPY;  Service:               Gastroenterology;  Laterality: N/A; 10/04/2023: EUS; N/A      Comment:  Procedure: ULTRASOUND, UPPER GI TRACT, ENDOSCOPIC;                Surgeon: Rollin Dover, MD;  Location: [facility] ENDOSCOPY;                Service: Gastroenterology;  Laterality: N/A; No date: JOINT REPLACEMENT; Left     Comment:  total knee replacement    Dr. Mardee 07/21/2023: SPHINCTEROTOMY     Comment:  Procedure: ANNETT, GI;  Surgeon: Rollin Dover,               MD;  Location: [facility] ENDOSCOPY;  Service: Gastroenterology;; 10/04/2023: CLEDA REMOVAL     Comment:  Procedure: STENT REMOVAL;  Surgeon: Rollin Dover, MD;                Location: [facility] ENDOSCOPY;  Service: Gastroenterology;; 11/01/2023: CLEDA REMOVAL     Comment:  Procedure: STENT REMOVAL;  Surgeon: Jinny Carmine, MD;                Location: [facility] ENDOSCOPY;  Service: Endoscopy;; No date: TONSILLECTOMY 11/20/2021: TOTAL KNEE ARTHROPLASTY; Right     Comment:  Procedure: RIGHT TOTAL KNEE ARTHROPLASTY;  Surgeon:               Liam Lerner, MD;  Location: [facility] ORS;  Service:               Orthopedics;  Laterality: Right; No date: WISDOM TOOTH EXTRACTION  BMI    Body Mass Index: 26.40 kg/m      Reproductive/Obstetrics negative OB ROS  Anesthesia Physical Anesthesia Plan  ASA: 3  Anesthesia Plan: General   Post-op Pain Management:    Induction: Intravenous  PONV Risk Score and Plan: Propofol  infusion and TIVA  Airway Management Planned: Natural Airway and Nasal Cannula  Additional Equipment:   Intra-op Plan:   Post-operative Plan:   Informed Consent: I have reviewed the patients History and Physical, chart, labs and discussed the procedure including the risks, benefits and alternatives for the proposed anesthesia with the patient or authorized representative who has indicated his/her understanding and acceptance.     Dental Advisory Given  Plan Discussed with: CRNA  Anesthesia Plan Comments:          Anesthesia Quick Evaluation  "

## 2024-01-27 NOTE — Op Note (Signed)
 [facility] Gastroenterology Patient Name: Kyle Holloway Procedure Date: 01/27/2024 11:18 AM MRN: [id number] Account #: [id number] Date of Birth: May 04, 1940 Admit Type: Outpatient Age: 83 Room: [facility] ENDO ROOM 4 Gender: Male Note Status: Finalized Instrument Name: CELINDA [id number] Procedure:             ERCP Indications:           Follow-up of ascending cholangitis, Stent removal Providers:             [name] MD, MD Referring MD:          [name] MD, MD (Referring MD), Kyle Holloway                         MD (Referring MD) Medicines:             Propofol  per Anesthesia Complications:         No immediate complications. Procedure:             Pre-Anesthesia Assessment:                        - Prior to the procedure, a History and Physical was                         performed, and patient medications and allergies were                         reviewed. The patient's tolerance of previous                         anesthesia was also reviewed. The risks and benefits                         of the procedure and the sedation options and risks                         were discussed with the patient. All questions were                         answered, and informed consent was obtained. Prior                         Anticoagulants: The patient has taken no anticoagulant                         or antiplatelet agents. ASA Grade Assessment: II - A                         patient with mild systemic disease. After reviewing                         the risks and benefits, the patient was deemed in                         satisfactory condition to undergo the procedure.                        After obtaining informed consent, the scope was passed  under direct vision. Throughout the procedure, the                         patient's blood pressure, pulse, and oxygen                         saturations were monitored continuously. The                          Duodenoscope was introduced through the mouth, and                         used to inject contrast into and used to inject                         contrast into the bile duct. The ERCP was accomplished                         without difficulty. The patient tolerated the                         procedure well. Findings:      A biliary stent was visible on the scout film. One stent was removed       from the common bile duct using a snare. The bile duct was deeply       cannulated with the short-nosed traction sphincterotome. Contrast was       injected. I personally interpreted the bile duct images. There was brisk       flow of contrast through the ducts. Image quality was excellent.       Contrast extended to the entire biliary tree. The lower third of the       main bile duct contained a single segmental stenosis. Cells for cytology       were obtained by brushing in the lower third of the main bile duct. The       biliary tree was swept with a 15 mm balloon starting at the bifurcation.       A few stones were removed. No stones remained. 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                        - Choledocholithiasis was found. Complete removal was                         accomplished by balloon extraction.                        - One stent was removed from the common bile duct.                        - Cells for cytology obtained in the lower third of                         the main duct.                        -  The biliary tree was swept. Recommendation:        - Discharge patient to home.                        - Resume previous diet.                        - Watch for pancreatitis, bleeding, perforation, and                         cholangitis.                        - Follow up with primary GI doctor and/or PCP for                         liver function test  in a week. Procedure Code(s):     --- Professional ---                        [id number], Endoscopic retrograde cholangiopancreatography                         (ERCP); with removal of foreign body(s) or stent(s)                         from biliary/pancreatic duct(s)                        43264, Endoscopic retrograde cholangiopancreatography                         (ERCP); with removal of calculi/debris from                         biliary/pancreatic duct(s)                        25671, Endoscopic catheterization of the biliary                         ductal system, radiological supervision and                         interpretation Diagnosis Code(s):     --- Professional ---                        S53.40, Encounter for fitting and adjustment of other                         gastrointestinal appliance and device                        K83.09, Other cholangitis CPT copyright 2022 [employer]. All rights reserved. The codes documented in this report are preliminary and upon coder review may  be revised to meet current compliance requirements. [name] MD, MD 01/27/2024 12:04:12 PM This report has been signed electronically. Number of Addenda: 0 Note Initiated On: 01/27/2024 11:18 AM Estimated Blood Loss:  Estimated blood loss: none.      [facility]

## 2024-01-28 LAB — CYTOLOGY - NON PAP

## 2024-01-31 ENCOUNTER — Ambulatory Visit: Payer: Self-pay | Admitting: Gastroenterology
